# Patient Record
Sex: Female | Born: 1960
Health system: Southern US, Community
[De-identification: ages and names within clinical notes are randomized; demographics above are authoritative.]

## PROBLEM LIST (undated history)

## (undated) DIAGNOSIS — M1990 Unspecified osteoarthritis, unspecified site: Secondary | ICD-10-CM

## (undated) DIAGNOSIS — I10 Essential (primary) hypertension: Secondary | ICD-10-CM

## (undated) DIAGNOSIS — K219 Gastro-esophageal reflux disease without esophagitis: Secondary | ICD-10-CM

## (undated) DIAGNOSIS — K5790 Diverticulosis of intestine, part unspecified, without perforation or abscess without bleeding: Secondary | ICD-10-CM

## (undated) DIAGNOSIS — T7840XA Allergy, unspecified, initial encounter: Secondary | ICD-10-CM

## (undated) DIAGNOSIS — K449 Diaphragmatic hernia without obstruction or gangrene: Secondary | ICD-10-CM

## (undated) DIAGNOSIS — Z86010 Personal history of colonic polyps: Secondary | ICD-10-CM

## (undated) HISTORY — DX: Unspecified osteoarthritis, unspecified site: M19.90

## (undated) HISTORY — PX: TONSILLECTOMY: SUR1361

## (undated) HISTORY — DX: Diverticulosis of intestine, part unspecified, without perforation or abscess without bleeding: K57.90

## (undated) HISTORY — DX: Diaphragmatic hernia without obstruction or gangrene: K44.9

## (undated) HISTORY — DX: Personal history of colonic polyps: Z86.010

## (undated) HISTORY — DX: Allergy, unspecified, initial encounter: T78.40XA

## (undated) HISTORY — DX: Essential (primary) hypertension: I10

---

## 1998-08-01 ENCOUNTER — Emergency Department (HOSPITAL_COMMUNITY): Admission: EM | Admit: 1998-08-01 | Discharge: 1998-08-01 | Payer: Self-pay | Admitting: Emergency Medicine

## 1998-08-01 ENCOUNTER — Encounter: Payer: Self-pay | Admitting: Emergency Medicine

## 1999-01-18 ENCOUNTER — Emergency Department (HOSPITAL_COMMUNITY): Admission: EM | Admit: 1999-01-18 | Discharge: 1999-01-18 | Payer: Self-pay | Admitting: Emergency Medicine

## 1999-04-03 ENCOUNTER — Emergency Department (HOSPITAL_COMMUNITY): Admission: EM | Admit: 1999-04-03 | Discharge: 1999-04-03 | Payer: Self-pay | Admitting: Emergency Medicine

## 1999-06-07 ENCOUNTER — Encounter: Payer: Self-pay | Admitting: Emergency Medicine

## 1999-06-07 ENCOUNTER — Emergency Department (HOSPITAL_COMMUNITY): Admission: EM | Admit: 1999-06-07 | Discharge: 1999-06-07 | Payer: Self-pay | Admitting: Emergency Medicine

## 1999-08-03 ENCOUNTER — Encounter: Admission: RE | Admit: 1999-08-03 | Discharge: 1999-08-03 | Payer: Self-pay | Admitting: Sports Medicine

## 1999-08-20 ENCOUNTER — Other Ambulatory Visit: Admission: RE | Admit: 1999-08-20 | Discharge: 1999-08-20 | Payer: Self-pay | Admitting: Obstetrics and Gynecology

## 1999-09-14 ENCOUNTER — Encounter: Admission: RE | Admit: 1999-09-14 | Discharge: 1999-09-14 | Payer: Self-pay | Admitting: Sports Medicine

## 1999-10-13 ENCOUNTER — Encounter: Admission: RE | Admit: 1999-10-13 | Discharge: 1999-10-13 | Payer: Self-pay | Admitting: Family Medicine

## 1999-11-03 ENCOUNTER — Encounter: Admission: RE | Admit: 1999-11-03 | Discharge: 1999-11-03 | Payer: Self-pay | Admitting: Family Medicine

## 1999-11-19 ENCOUNTER — Emergency Department (HOSPITAL_COMMUNITY): Admission: EM | Admit: 1999-11-19 | Discharge: 1999-11-19 | Payer: Self-pay | Admitting: Emergency Medicine

## 2001-11-06 ENCOUNTER — Encounter: Admission: RE | Admit: 2001-11-06 | Discharge: 2001-11-06 | Payer: Self-pay | Admitting: Family Medicine

## 2002-02-13 ENCOUNTER — Emergency Department (HOSPITAL_COMMUNITY): Admission: EM | Admit: 2002-02-13 | Discharge: 2002-02-13 | Payer: Self-pay | Admitting: Emergency Medicine

## 2005-05-18 ENCOUNTER — Inpatient Hospital Stay (HOSPITAL_COMMUNITY): Admission: AD | Admit: 2005-05-18 | Discharge: 2005-05-18 | Payer: Self-pay | Admitting: Obstetrics and Gynecology

## 2005-08-01 ENCOUNTER — Emergency Department (HOSPITAL_COMMUNITY): Admission: EM | Admit: 2005-08-01 | Discharge: 2005-08-01 | Payer: Self-pay | Admitting: *Deleted

## 2006-10-24 HISTORY — PX: TOTAL ABDOMINAL HYSTERECTOMY W/ BILATERAL SALPINGOOPHORECTOMY: SHX83

## 2006-12-07 ENCOUNTER — Ambulatory Visit: Payer: Self-pay | Admitting: Obstetrics & Gynecology

## 2006-12-07 ENCOUNTER — Encounter: Payer: Self-pay | Admitting: Obstetrics & Gynecology

## 2006-12-07 ENCOUNTER — Encounter (INDEPENDENT_AMBULATORY_CARE_PROVIDER_SITE_OTHER): Payer: Self-pay | Admitting: *Deleted

## 2006-12-12 ENCOUNTER — Ambulatory Visit (HOSPITAL_COMMUNITY): Admission: RE | Admit: 2006-12-12 | Discharge: 2006-12-12 | Payer: Self-pay | Admitting: Obstetrics and Gynecology

## 2006-12-21 ENCOUNTER — Ambulatory Visit: Payer: Self-pay | Admitting: Obstetrics & Gynecology

## 2006-12-27 ENCOUNTER — Encounter: Admission: RE | Admit: 2006-12-27 | Discharge: 2006-12-27 | Payer: Self-pay | Admitting: Family Medicine

## 2007-01-17 ENCOUNTER — Ambulatory Visit: Payer: Self-pay | Admitting: Obstetrics & Gynecology

## 2007-01-29 ENCOUNTER — Ambulatory Visit (HOSPITAL_COMMUNITY): Admission: RE | Admit: 2007-01-29 | Discharge: 2007-01-29 | Payer: Self-pay | Admitting: Obstetrics and Gynecology

## 2007-02-06 ENCOUNTER — Encounter (INDEPENDENT_AMBULATORY_CARE_PROVIDER_SITE_OTHER): Payer: Self-pay | Admitting: *Deleted

## 2007-02-06 ENCOUNTER — Ambulatory Visit: Payer: Self-pay | Admitting: Obstetrics & Gynecology

## 2007-02-06 ENCOUNTER — Inpatient Hospital Stay (HOSPITAL_COMMUNITY): Admission: RE | Admit: 2007-02-06 | Discharge: 2007-02-07 | Payer: Self-pay | Admitting: Obstetrics & Gynecology

## 2007-02-09 ENCOUNTER — Inpatient Hospital Stay (HOSPITAL_COMMUNITY): Admission: AD | Admit: 2007-02-09 | Discharge: 2007-02-09 | Payer: Self-pay | Admitting: Obstetrics & Gynecology

## 2007-02-09 ENCOUNTER — Ambulatory Visit: Payer: Self-pay | Admitting: Obstetrics & Gynecology

## 2007-02-15 ENCOUNTER — Ambulatory Visit: Payer: Self-pay | Admitting: Obstetrics & Gynecology

## 2007-02-16 ENCOUNTER — Inpatient Hospital Stay (HOSPITAL_COMMUNITY): Admission: AD | Admit: 2007-02-16 | Discharge: 2007-02-16 | Payer: Self-pay | Admitting: Family Medicine

## 2007-02-19 ENCOUNTER — Ambulatory Visit: Payer: Self-pay | Admitting: Obstetrics & Gynecology

## 2007-03-21 ENCOUNTER — Ambulatory Visit: Payer: Self-pay | Admitting: Obstetrics & Gynecology

## 2007-05-24 ENCOUNTER — Emergency Department (HOSPITAL_COMMUNITY): Admission: EM | Admit: 2007-05-24 | Discharge: 2007-05-24 | Payer: Self-pay | Admitting: Emergency Medicine

## 2007-07-05 ENCOUNTER — Ambulatory Visit: Payer: Self-pay | Admitting: Obstetrics and Gynecology

## 2007-08-15 ENCOUNTER — Emergency Department (HOSPITAL_COMMUNITY): Admission: EM | Admit: 2007-08-15 | Discharge: 2007-08-15 | Payer: Self-pay | Admitting: Emergency Medicine

## 2007-08-30 ENCOUNTER — Ambulatory Visit: Payer: Self-pay | Admitting: Family Medicine

## 2007-08-30 LAB — CONVERTED CEMR LAB
Basophils Absolute: 0.1 10*3/uL (ref 0.0–0.1)
Basophils Relative: 1 % (ref 0–1)
Chloride: 106 meq/L (ref 96–112)
Creatinine, Ser: 0.76 mg/dL (ref 0.40–1.20)
Eosinophils Absolute: 0.3 10*3/uL (ref 0.0–0.7)
Hemoglobin: 14.7 g/dL (ref 12.0–15.0)
Lymphs Abs: 2.5 10*3/uL (ref 0.7–3.3)
MCHC: 32.9 g/dL (ref 30.0–36.0)
Neutro Abs: 6.8 10*3/uL (ref 1.7–7.7)
Neutrophils Relative %: 67 % (ref 43–77)
Platelets: 263 10*3/uL (ref 150–400)
RBC: 5.03 M/uL (ref 3.87–5.11)
WBC: 10.1 10*3/uL (ref 4.0–10.5)

## 2007-10-15 ENCOUNTER — Ambulatory Visit: Payer: Self-pay | Admitting: *Deleted

## 2007-10-15 ENCOUNTER — Ambulatory Visit: Payer: Self-pay | Admitting: Family Medicine

## 2007-10-15 LAB — CONVERTED CEMR LAB
AST: 20 units/L (ref 0–37)
BUN: 11 mg/dL (ref 6–23)
CO2: 24 meq/L (ref 19–32)
Chloride: 106 meq/L (ref 96–112)
Creatinine, Ser: 0.77 mg/dL (ref 0.40–1.20)
Glucose, Bld: 98 mg/dL (ref 70–99)
LDL Cholesterol: 233 mg/dL — ABNORMAL HIGH (ref 0–99)
Potassium: 4.3 meq/L (ref 3.5–5.3)
Sodium: 140 meq/L (ref 135–145)
Total Protein: 7.2 g/dL (ref 6.0–8.3)
VLDL: 58 mg/dL — ABNORMAL HIGH (ref 0–40)

## 2007-11-01 ENCOUNTER — Emergency Department (HOSPITAL_COMMUNITY): Admission: EM | Admit: 2007-11-01 | Discharge: 2007-11-01 | Payer: Self-pay | Admitting: Emergency Medicine

## 2007-12-13 ENCOUNTER — Ambulatory Visit (HOSPITAL_COMMUNITY): Admission: RE | Admit: 2007-12-13 | Discharge: 2007-12-13 | Payer: Self-pay | Admitting: *Deleted

## 2007-12-21 ENCOUNTER — Ambulatory Visit: Payer: Self-pay | Admitting: Family Medicine

## 2008-01-31 ENCOUNTER — Emergency Department (HOSPITAL_COMMUNITY): Admission: EM | Admit: 2008-01-31 | Discharge: 2008-01-31 | Payer: Self-pay | Admitting: Emergency Medicine

## 2008-04-14 ENCOUNTER — Emergency Department (HOSPITAL_COMMUNITY): Admission: EM | Admit: 2008-04-14 | Discharge: 2008-04-14 | Payer: Self-pay | Admitting: Family Medicine

## 2008-04-15 ENCOUNTER — Emergency Department (HOSPITAL_COMMUNITY): Admission: EM | Admit: 2008-04-15 | Discharge: 2008-04-15 | Payer: Self-pay | Admitting: Emergency Medicine

## 2008-08-12 ENCOUNTER — Emergency Department (HOSPITAL_COMMUNITY): Admission: EM | Admit: 2008-08-12 | Discharge: 2008-08-12 | Payer: Self-pay | Admitting: Emergency Medicine

## 2009-01-11 IMAGING — US US TRANSVAGINAL NON-OB
1 series · 13 of 25 positions shown · non-contrast
Comparison: none

CLINICAL DATA: Symptomatic fibroids. 
TRANSABDOMINAL AND TRANSVAGINAL PELVIC ULTRASOUND:
TECHNIQUE: Both transabdominal and transvaginal ultrasound examinations of the pelvis were performed including evaluation of the uterus, ovaries, adnexal regions, and pelvic cul-de-sac.

[Series 1: us transvaginal non-ob · 0.33mm/px · 13 of 66 slices shown]
[im 1/66]
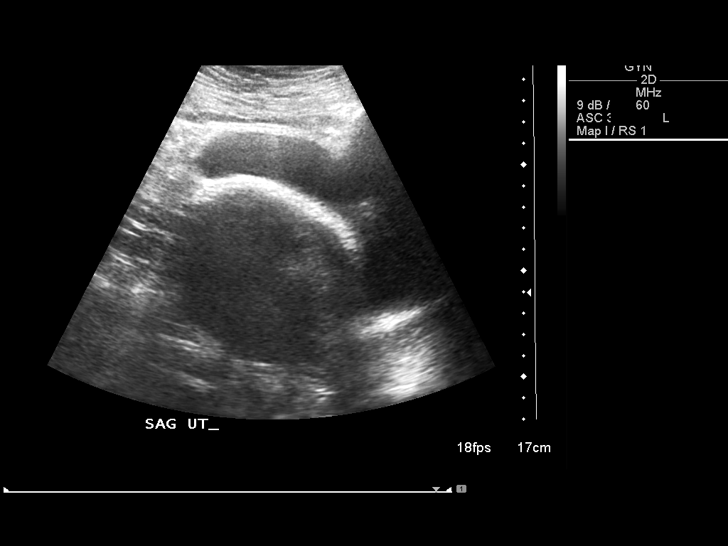
[im 6/66]
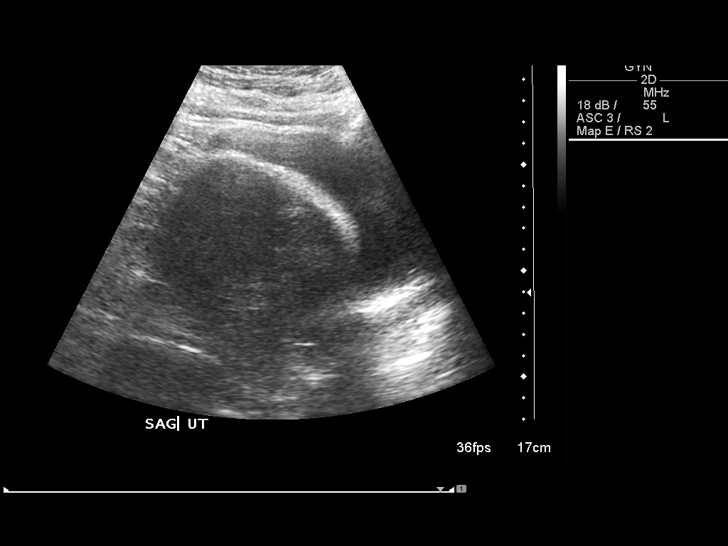
[im 11/66]
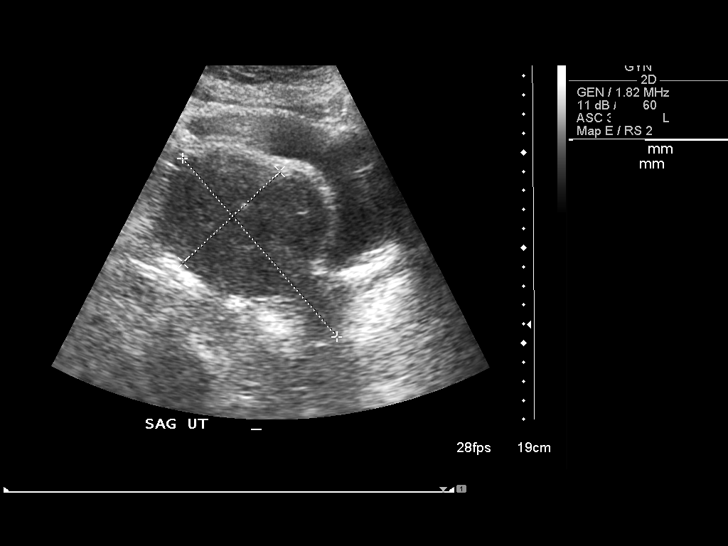
[im 17/66]
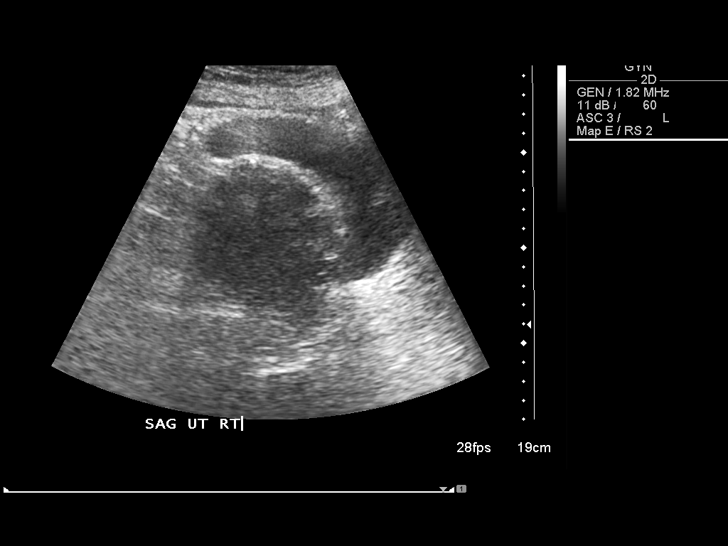
[im 22/66]
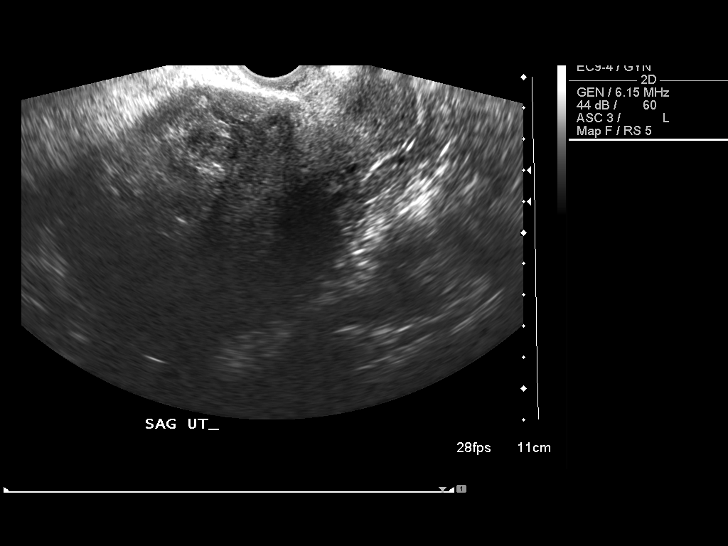
[im 28/66]
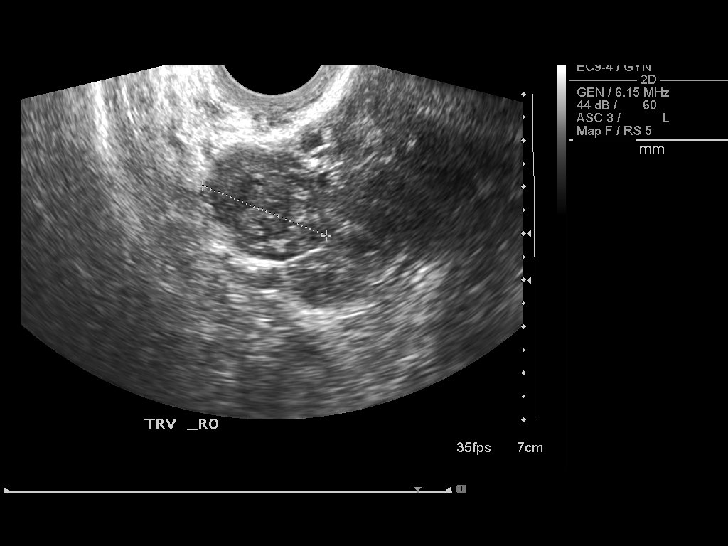
[im 33/66]
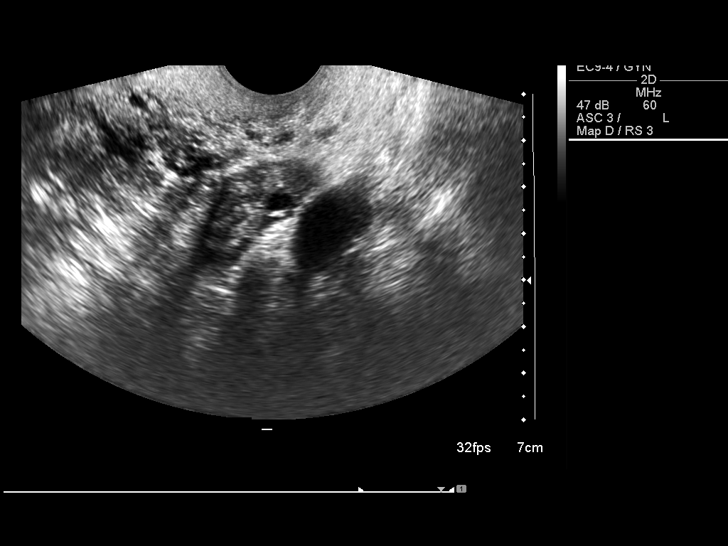
[im 38/66]
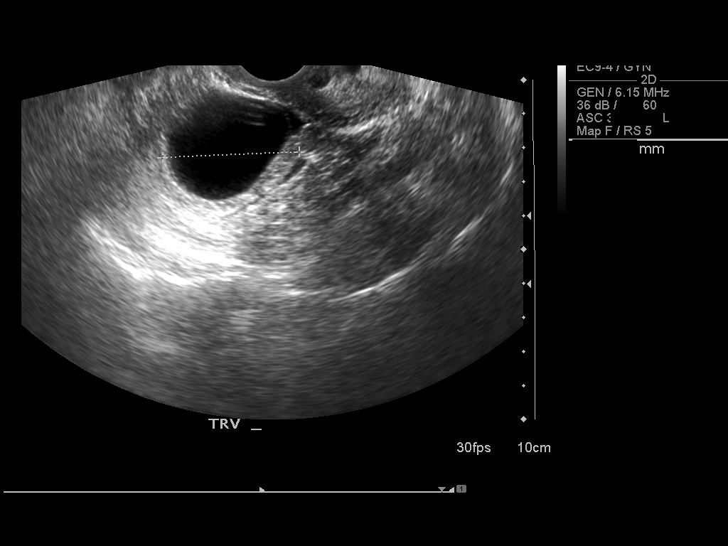
[im 44/66]
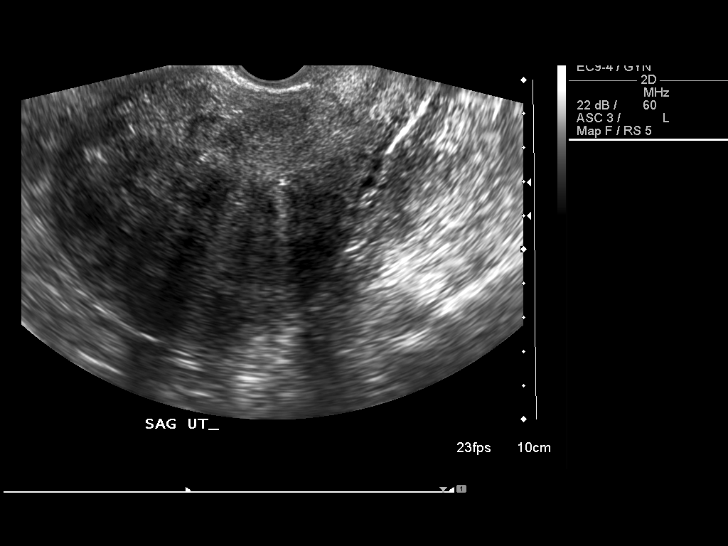
[im 49/66]
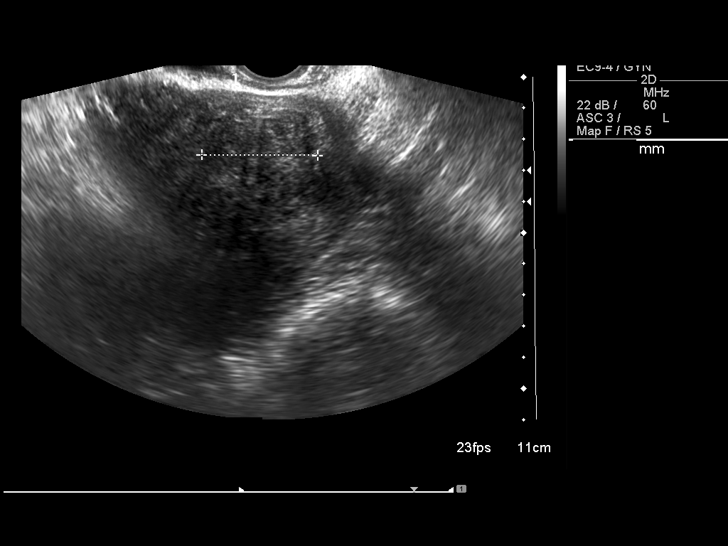
[im 55/66]
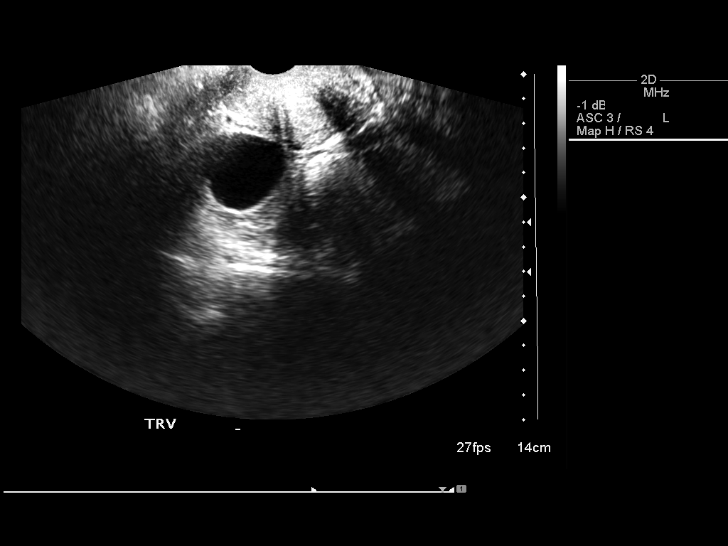
[im 60/66]
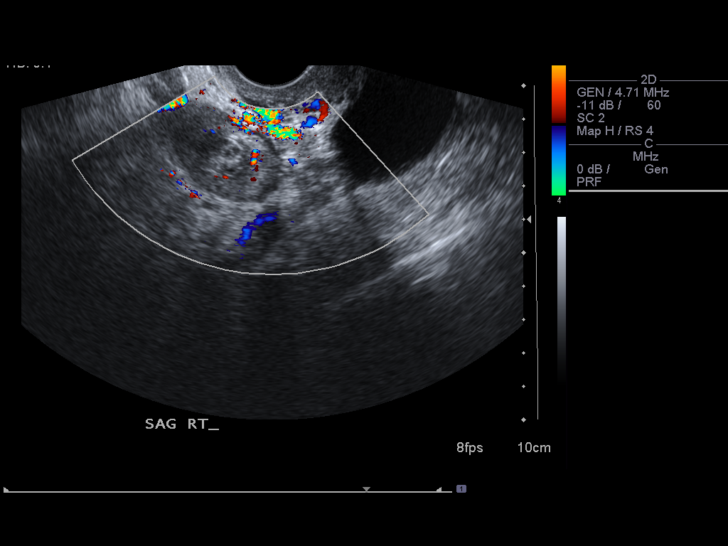
[im 66/66]
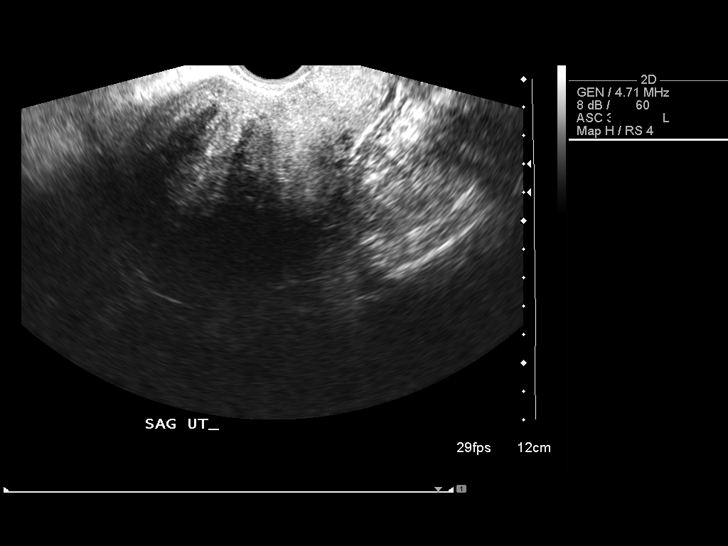

[13 of 25 positions shown; findings below may reference images not displayed]

FINDINGS: The uterus is enlarged with a sagittal length of 12.4 cm, and AP width of 7.6 cm, and a transverse width of 7.4 cm.  The myometrium is diffusely inhomogeneous compatible with fibroid involvement. Only one fibroid could accurately be measured transabdominally and endo-vaginally located in the anterior lower uterine segment measuring 3.7 x 4.5 x 4.0 cm.  Imaging parameters were suboptimal both transabdominally and endo-vaginally in this patient, but diffuse inhomogeneity suggests diffuse fibroid involvement. The endometrial lining could not be clearly enough seen either transabdominally or endo-vaginally to allow for accurate evaluation. 
Both ovaries are seen and have a normal appearance with the right ovary measuring 2.9 x 1.8 x 2.7 cm and the left ovary measuring 2.1 x 3.0 x 1.3 cm.  
In the cul-de-sac there is a thin-walled simple cystic structure noted measuring 4.3 x 2.7 x 3.6 cm. The thin-walled simple nature suggests that this is benign in etiology and likely represents some loculated fluid in this location. The possibility also exists that this represents dilated ampullary portion of one of the fallopian tubes, although it is not close to either ovary making this a less likely consideration.
IMPRESSION: 1.  Fibroid uterus with poor delineation of individual fibroids. Nonvisualized endometrial stripe. 
2.  Normal ovaries. 
3.  Thin-walled unilocular simple cystic structure in the cul-de-sac felt most compatible with an inclusion cyst or loculated fluid.

## 2010-10-10 ENCOUNTER — Emergency Department (HOSPITAL_COMMUNITY)
Admission: EM | Admit: 2010-10-10 | Discharge: 2010-10-10 | Payer: Self-pay | Source: Home / Self Care | Admitting: Emergency Medicine

## 2010-11-14 ENCOUNTER — Encounter: Payer: Self-pay | Admitting: *Deleted

## 2011-03-08 NOTE — Group Therapy Note (Signed)
[name], [name] NO.:  [id number]   MEDICAL RECORD NO.:  [id number]          PATIENT TYPE:  WOC   LOCATION:  [facility]                   FACILITY:  [facility]   PHYSICIAN:  Argentina Donovan, MD        DATE OF BIRTH:  03-Aug-1961   DATE OF SERVICE:                                  CLINIC NOTE   The patient is a 50 year old African American female who underwent total  vaginal hysterectomy/bilateral salpingo-oophorectomy, tension-free  vaginal tape cystoscopy and anterior colporrhaphy on February 06, 2007.  She has been doing very well since surgery except that she is not  tolerating the estrogen patch very well.  She is having rashes from the  patch no matter where she changes it and she still feels jittery as can  be, she says.  She wants the pill.  We have offered her the  alternatives of the pill or the gel.  She prefers the pill. I am going  to start her on Premarin 0.9 a day and then if she is not better in a  month, I think that we may add some Prozac or other SSRI to the regimen.   IMPRESSION:  Post menopausal symptoms and anxiety.           ______________________________  Argentina Donovan, MD     PR/MEDQ  D:  07/05/2007  T:  07/06/2007  Job:  [id number]

## 2011-03-11 NOTE — Op Note (Signed)
NAMEMOLLYE, [name]                ACCOUNT NO.:  [id number]   MEDICAL RECORD NO.:  [id number]          PATIENT TYPE:  INP   LOCATION:  9319                          [facility]   PHYSICIAN:  Allie Bossier, MD        DATE OF BIRTH:  [birth year]   DATE OF PROCEDURE:  02/06/2007  DATE OF DISCHARGE:  02/07/2007                               OPERATIVE REPORT   SURGEON:  Myra C. Marice Potter, M.D.   ASSISTANT:  Blima Rich, M.D.   ANESTHESIA:  Lacretia Leigh, M.D.   COMPLICATIONS:  None.   ESTIMATED BLOOD LOSS:  600 mL.   SPECIMENS:  Morcellated uterus, bilateral tubes and ovaries.   PROCEDURE:  Total vaginal hysteroscopy. Bilateral salpingo-oophorectomy.  Tension-free vaginal tape. Cystoscopy. Anterior colporrhaphy.   DETAILED PROCEDURE AND FINDINGS:  In the office and in the preoperative  area, risks, benefits and alternatives of surgery were explained,  understood and accepted. She understood and wished to proceed. She was  placed in a dorsal lithotomy position, and once she reported herself to  be comfortable, general anesthesia was administered without  complication. Her lower abdomen and vagina were prepped and draped in  usual sterile fashion. A Foley catheter was placed. A bimanual exam  revealed a uterus 10 to 12 weeks' size - globular, not enlarged adnexa,  and a generous pelvis. A weighted speculum was placed posteriorly, a  Deaver anteriorly. The cervix was grasped with a single-toothed  tenaculum, and a total of approximately 60 mL of a solution containing  0.5% Marcaine and 100 mL of injectable saline. Sixty mL of this solution  was injected in a circumferential fashion at the cervicovaginal  junction. Using the Bovie cautery, an incision was made at the  cervicovaginal junction in the anterior and posterior area. The  posterior peritoneum was identified and entered. A longer weighted  speculum was then placed. Of note, she was in Trendelenburg position.  The anterior  peritoneum was identified and entered. The uterosacral  ligaments were clamped, cut and ligated. Please note that 2-0 Vicryl is  used throughout this case unless otherwise specific. The long cervix was  separated from its pelvic attachments using a similar clamp, cut and  ligate technique. Once the uterine vessels were identified, clamped, cut  and ligated, the cervix was then removed. Began coring out large  fibroids to aid in visualization of the remainder of the uterine  attachments to the pelvis. Once I was able to clearly visualize the  broad ligament and utero-ovarian ligaments bilaterally, these  attachments were clamped, cut and ligated. The very large morcellated  uterus was then removed. The ovaries were inspected bilaterally and  noted to be of normal size and shape. There was a very large (3 cm) cyst  of Morgagni on the patient's right oviduct. The utero-ovarian ligaments  bilaterally were identified, clamped, cut and ligated. The bowel was  kept out of the operative site with a moist lap sponge. Once the removal  of the adnexa was completed, all pedicles were inspected and noted to be  hemostatic. Her bladder was again  drained. At the beginning of the case,  a Foley catheter was placed, her bladder was drained, and then the Foley  was clamped. At the completion of the hysterectomy portion of the case,  the bladder was emptied, and clear urine was noted. At this point, the  vaginal mucosa was closed in a vertical fashion with a 2-0 Vicryl  running locking suture. Excellent hemostasis was noted. I then proceeded  to begin the TVT and anterior colporrhaphy portion of the surgery. A  short-weighted speculum was again used in the vault of the vagina, and  the remainder of the injectable solution was injected in the vaginal  mucosa and using a spinal needle behind the symphysis pubis on either  side for placement of the TVT. A vertical incision was made in the  vaginal mucosa from  the cuff to the urethral meatus. Using T clamps and  blunt dissection, the overlying vaginal mucosa was excised off of the  underlying fascia. At this point, I proceeded with the tension-free  vaginal tape placement. The trocar was placed, aiming for each  ipsilateral shoulder. A bladder stylus was placed, and the bladder was  deviated to avoid bladder injury. The trocar was brought behind the  symphysis on the patient's right-hand side, and the trocar was removed.  A similar procedure was performed on the patient's left-hand side. At  this point, I performed cystoscopy. No abnormalities of the bladder were  seen. The plastic sheath was removed from the tension-free vaginal tape,  and the tension-free vaginal tape was elevated to approximately a half a  centimeter beneath the mid urethra. The extra portion of the tape was  cut just below the skin, and Steri-Strips were placed at these sites. I  then proceeded to continue with the anterior colporrhaphy. The cystocele  defect was closed with interrupted 3-0 Vicryl sutures. The excess  vaginal mucosa was excised, and the vaginal mucosa was then  reapproximated using 3-0 Vicryl running locking suture. Excellent  hemostasis was noted. Her Foley was placed to bag. Clear urine was  noted. She was extubated and taken to the recovery room in stable  condition with the instrument, sponge and needle counts correct.      Allie Bossier, MD  Electronically Signed     MCD/MEDQ  D:  02/09/2007  T:  02/09/2007  Job:  [id number]

## 2011-03-11 NOTE — Discharge Summary (Signed)
NAMELURETTA, [name]                ACCOUNT NO.:  [id number]   MEDICAL RECORD NO.:  [id number]          PATIENT TYPE:  INP   LOCATION:  9319                          [facility]   PHYSICIAN:  Allie Bossier, MD        DATE OF BIRTH:  06/30/1961   DATE OF ADMISSION:  02/06/2007  DATE OF DISCHARGE:  02/07/2007                               DISCHARGE SUMMARY   CONDITION:  Stable.   DISPOSITION:  Home.   DIET:  As tolerated.   ACTIVITY:  As tolerated with the exception of nothing per vagina for 6  weeks.   PROCEDURES DURING HOSPITAL STAY:  Total vaginal hysterectomy, bilateral  salpingo-oophorectomy, tension-free vaginal tape, cystoscopy and  anterior colporrhaphy.  Placement of Foley catheter at discharge.   DISCHARGE MEDICATIONS:  1. Zyban 150 mg daily.  2. Climara 0.5 mg patch change weekly.  3. Percocet 5/325 mg 1 p.o. q.4h. p.r.n., #30 and no refills.   DETAILED HOSPITAL COURSE:  The patient has a history of dysfunctional  uterine bleeding, fibroids and stress incontinence.  She was admitted  and underwent the above mentioned procedures in an uncomplicated  fashion.  On postoperative day #1, she was tolerating p.o. without  nausea or vomiting and wishing to be discharged home.  Her preoperative  hemoglobin was 12 and postoperative was 9.  After removal of the  catheter, but prior to discharge, she was only able to void 100 mL and  had approximately 300 mL left.  She was sent home with a catheter and  will follow up on Friday.      Allie Bossier, MD  Electronically Signed     MCD/MEDQ  D:  02/09/2007  T:  02/09/2007  Job:  [id number]

## 2011-03-11 NOTE — H&P (Signed)
[name], [name]                ACCOUNT NO.:  [id number]   MEDICAL RECORD NO.:  [id number]          PATIENT TYPE:  AMB   LOCATION:                                [facility]   PHYSICIAN:  Phil D. Okey Dupre, M.D.     DATE OF BIRTH:  August 19, 1961   DATE OF ADMISSION:  02/06/2007  DATE OF DISCHARGE:                              HISTORY & PHYSICAL   The patient is scheduled for surgery in [facility] with Dr. Marice Potter  and Dr. Mia Creek on 02/06/2007.   CHIEF COMPLAINT:  Extremely heavy painful periods disabling in effect  and losing urine.   HISTORY OF PRESENT ILLNESS:  The patient is a 50 year old gravida 4,  para 3-0-1-3 who over the past 1-1/2 has developed increasingly severe  pelvic pressure and pain with her periods that have become intolerable  and extraordinarily heavy with big clots.  She was told several years  ago she had some fibroids when an ultrasound revealed a uterus measuring  13 centimeters across.  She was seen by Dr. Marice Potter and Dr. Mia Creek and  scheduled for a total vaginal hysterectomy and an anterior colporrhaphy  with TVT.   PAST MEDICAL HISTORY:  The patient is in extremely good health with no  history of surgery.   ALLERGIES:  None.   MEDICATIONS:  None although I have told the patient to start on iron up  until surgery.   FAMILY HISTORY:  Includes a mother with high blood pressure but no other  significant history.   REVIEW OF SYSTEMS:  Negative with the exception of the present illness,  urinary, especially which includes urgency in the morning especially  often losing urine without stress on coughing, sneezing, or sometimes  the slightest amount of jump will cause her to soak her panties.   PHYSICAL EXAMINATION:  VITAL SIGNS:  The patient's blood pressure is  122/79, pulse is 76, temperature is 97.6, respirations 14 per minute.  The weight of the patient is 230 pounds, and she is 5 feet 10 inches  tall.  She is a well-developed, well-nourished  [place] female  in no acute distress.  HEENT:  Pupils equal, round, and reactive to light and accommodation.  Normocephalic within normal limits.  NECK:  Supple.  Thyroid is symmetrical with no masses.  BACK:  Erect.  BREASTS:  Symmetrical with no dominant masses, no nipple discharge.  LUNGS:  Clear to auscultation and percussion.  HEART:  No murmur.  Normal sinus rhythm.  ABDOMEN:  Soft.  Obese. No organomegaly could be palpated, and it is  nontender.  No CVA tenderness.  EXTREMITIES:  No edema.  No varices.  NEUROLOGIC:  Deep tendon reflexes within normal limits.  PELVIC:  External genitalia is normal.  BUS within normal limits with  the exception of a first degree cystocele.  The uterus could not be well  outlined.  The cervix is hypertrophied.  The adnexa could not be  palpated.   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   PLAN:  Total vaginal hysterectomy, possible abdominal hysterectomy with  TVT and anterior colporrhaphy.  Because of the patient's last ultrasound  and the inability to outline the uterus, I have requested a second  ultrasound at this time to make sure that a vaginal hysterectomy is  practical.  In addition, I am getting a CBC today to see whether the  patient needs any further treatment of anemia.           ______________________________  Javier Glazier Okey Dupre, M.D.     PDR/MEDQ  D:  01/17/2007  T:  01/17/2007  Job:  [id number]

## 2011-03-21 ENCOUNTER — Emergency Department (HOSPITAL_COMMUNITY)
Admission: EM | Admit: 2011-03-21 | Discharge: 2011-03-21 | Disposition: A | Discharge status: Home / Self Care | Payer: Self-pay | Attending: Emergency Medicine | Admitting: Emergency Medicine

## 2011-03-21 DIAGNOSIS — K089 Disorder of teeth and supporting structures, unspecified: Secondary | ICD-10-CM | POA: Insufficient documentation

## 2011-03-21 DIAGNOSIS — K047 Periapical abscess without sinus: Secondary | ICD-10-CM | POA: Insufficient documentation

## 2011-10-25 DIAGNOSIS — Z860100 Personal history of colon polyps, unspecified: Secondary | ICD-10-CM

## 2011-10-25 DIAGNOSIS — K5790 Diverticulosis of intestine, part unspecified, without perforation or abscess without bleeding: Secondary | ICD-10-CM

## 2011-10-25 HISTORY — DX: Personal history of colonic polyps: Z86.010

## 2011-10-25 HISTORY — DX: Personal history of colon polyps, unspecified: Z86.0100

## 2011-10-25 HISTORY — DX: Diverticulosis of intestine, part unspecified, without perforation or abscess without bleeding: K57.90

## 2012-02-18 ENCOUNTER — Emergency Department (HOSPITAL_COMMUNITY)
Admission: EM | Admit: 2012-02-18 | Discharge: 2012-02-18 | Disposition: A | Discharge status: Home / Self Care | Payer: Self-pay | Attending: Emergency Medicine | Admitting: Emergency Medicine

## 2012-02-18 ENCOUNTER — Encounter (HOSPITAL_COMMUNITY): Payer: Self-pay | Admitting: General Practice

## 2012-02-18 DIAGNOSIS — M1710 Unilateral primary osteoarthritis, unspecified knee: Secondary | ICD-10-CM | POA: Insufficient documentation

## 2012-02-18 DIAGNOSIS — M1990 Unspecified osteoarthritis, unspecified site: Secondary | ICD-10-CM

## 2012-02-18 MED ORDER — HYDROCODONE-ACETAMINOPHEN 5-325 MG PO TABS: 1.0000 | ORAL_TABLET | ORAL | Status: AC | PRN

## 2012-02-18 MED ORDER — MELOXICAM 7.5 MG PO TABS: 7.5000 mg | ORAL_TABLET | Freq: Every day | ORAL | Status: DC

## 2012-02-18 NOTE — ED Provider Notes (Signed)
History     CSN: [id number]  Arrival date & time 02/18/12  1910   First MD Initiated Contact with Patient 02/18/12 2043      No chief complaint on file.   (Consider location/radiation/quality/duration/timing/severity/associated sxs/prior treatment) HPI History provided by pt.   Pt has been diagnosed w/ knee arthritis in the past.  For the past three weeks, she has had gradually worsening pain and edema of both knees, but especially the left.  Pain most prominent anteriorly, but when she ambulates, she develops a "pulling" pain from proximal left calf to distal posterior thigh.  Sx worst at night.  Has been taking ibuprofen w/out relief.  No associated fever or paresthesias.  Denies trauma but patient on her feet all day at work on a [employer]. No RF for DVT.    No past medical history on file.  Past Surgical History  Procedure Date  . Tonsillectomy   . Vaginal hysterectomy     No family history on file.  History  Substance Use Topics  . Smoking status: Not on file  . Smokeless tobacco: Not on file  . Alcohol Use: Not on file    OB History    No data available      Review of Systems  All other systems reviewed and are negative.    Allergies  Review of patient's allergies indicates no known allergies.  Home Medications   Current Outpatient Rx  Name Route Sig Dispense Refill  . ASPIRIN 81 MG PO CHEW Oral Chew 81 mg by mouth daily.    Marland Kitchen CETIRIZINE HCL 10 MG PO TABS Oral Take 10 mg by mouth daily as needed. Allergies.    . IBUPROFEN 200 MG PO TABS Oral Take 800 mg by mouth every 6 (six) hours as needed. Pain.    Marland Kitchen HYDROCODONE-ACETAMINOPHEN 5-325 MG PO TABS Oral Take 1 tablet by mouth every 4 (four) hours as needed for pain. 20 tablet 0  . MELOXICAM 7.5 MG PO TABS Oral Take 1 tablet (7.5 mg total) by mouth daily. 30 tablet 0    There were no vitals taken for this visit.  Physical Exam  Nursing note and vitals reviewed. Constitutional: She is oriented to  person, place, and time. She appears well-developed and well-nourished. No distress.  HENT:  Head: Normocephalic and atraumatic.  Eyes:       Normal appearance  Neck: Normal range of motion.  Pulmonary/Chest: Effort normal.  Musculoskeletal: Normal range of motion.       Edema bilateral anterior knees, left worse than right.  No erythema or warmth.  Mild tenderness medial and lateral to left patella.  Right knee non-tender.  Full active ROM both knees but pain w/ complete extension and extreme flexion of left.  Distal NV intact.  Pt is ambulatory.  Neurological: She is alert and oriented to person, place, and time.  Psychiatric: She has a normal mood and affect. Her behavior is normal.    ED Course  Procedures (including critical care time)  Labs Reviewed - No data to display No results found.   1. Arthritis       MDM  Pt w/ h/o knee arthritis presents w/ gradually worsening, non-traumatic pain/edema bilateral knees, left worse than right.  Pain most prominent anteriorly but pulling sensation posterior left leg when bearing weight.  No RF for DVT and edema is isolated to anterior knee joint on exam.  No signs of joint infection and no NS deficits.  Pt d/c'd home  w/ meloxicam, hydrocodone and referral to ortho.  Recommended ice and elevation.  She declined crutches.  Return precautions discussed.         Otilio Miu, PA 02/19/12 1256  Otilio Miu, [place] 02/19/12 1258

## 2012-02-18 NOTE — ED Notes (Signed)
Pt states that she has been having swelling off and on for the last 3 weeks in her right knee. She states that she has taken ibuprofen for the swelling in pain. Pt is in no acute distress at this time.

## 2012-02-18 NOTE — Discharge Instructions (Signed)
Take meloxicam once a day.  Discontinue the ibuprofen while your on this medication. Arthritis, Nonspecific Arthritis is inflammation of a joint. This usually means pain, redness, warmth or swelling are present. One or more joints may be involved. There are a number of types of arthritis. Your caregiver may not be able to tell what type of arthritis you have right away. CAUSES  The most common cause of arthritis is the wear and tear on the joint (osteoarthritis). This causes damage to the cartilage, which can break down over time. The knees, hips, back and neck are most often affected by this type of arthritis. Other types of arthritis and common causes of joint pain include:  Sprains and other injuries near the joint. Sometimes minor sprains and injuries cause pain and swelling that develop hours later.   Rheumatoid arthritis. This affects hands, feet and knees. It usually affects both sides of your body at the same time. It is often associated with chronic ailments, fever, weight loss and general weakness.   Crystal arthritis. Gout and pseudo gout can cause occasional acute severe pain, redness and swelling in the foot, ankle, or knee.   Infectious arthritis. Bacteria can get into a joint through a break in overlying skin. This can cause infection of the joint. Bacteria and viruses can also spread through the blood and affect your joints.   Drug, infectious and allergy reactions. Sometimes joints can become mildly painful and slightly swollen with these types of illnesses.  SYMPTOMS   Pain is the main symptom.   Your joint or joints can also be red, swollen and warm or hot to the touch.   You may have a fever with certain types of arthritis, or even feel overall ill.   The joint with arthritis will hurt with movement. Stiffness is present with some types of arthritis.  DIAGNOSIS  Your caregiver will suspect arthritis based on your description of your symptoms and on your exam. Testing may  be needed to find the type of arthritis:  Blood and sometimes urine tests.   X-ray tests and sometimes CT or MRI scans.   Removal of fluid from the joint (arthrocentesis) is done to check for bacteria, crystals or other causes. Your caregiver (or a specialist) will numb the area over the joint with a local anesthetic, and use a needle to remove joint fluid for examination. This procedure is only minimally uncomfortable.   Even with these tests, your caregiver may not be able to tell what kind of arthritis you have. Consultation with a specialist (rheumatologist) may be helpful.  TREATMENT  Your caregiver will discuss with you treatment specific to your type of arthritis. If the specific type cannot be determined, then the following general recommendations may apply. Treatment of severe joint pain includes:  Rest.   Elevation.   Anti-inflammatory medication (for example, ibuprofen) may be prescribed. Avoiding activities that cause increased pain.   Only take over-the-counter or prescription medicines for pain and discomfort as recommended by your caregiver.   Cold packs over an inflamed joint may be used for 10 to 15 minutes every hour. Hot packs sometimes feel better, but do not use overnight. Do not use hot packs if you are diabetic without your caregiver's permission.   A cortisone shot into arthritic joints may help reduce pain and swelling.   Any acute arthritis that gets worse over the next 1 to 2 days needs to be looked at to be sure there is no joint infection.  Long-term  arthritis treatment involves modifying activities and lifestyle to reduce joint stress jarring. This can include weight loss. Also, exercise is needed to nourish the joint cartilage and remove waste. This helps keep the muscles around the joint strong. HOME CARE INSTRUCTIONS   Do not take aspirin to relieve pain if gout is suspected. This elevates uric acid levels.   Only take over-the-counter or prescription  medicines for pain, discomfort or fever as directed by your caregiver.   Rest the joint as much as possible.   If your joint is swollen, keep it elevated.   Use crutches if the painful joint is in your leg.   Drinking plenty of fluids may help for certain types of arthritis.   Follow your caregiver's dietary instructions.   Try low-impact exercise such as:   Swimming.   Water aerobics.   Biking.   Walking.   Morning stiffness is often relieved by a warm shower.   Put your joints through regular range-of-motion.  SEEK MEDICAL CARE IF:   You do not feel better in 24 hours or are getting worse.   You have side effects to medications, or are not getting better with treatment.  SEEK IMMEDIATE MEDICAL CARE IF:   You have a fever.   You develop severe joint pain, swelling or redness.   Many joints are involved and become painful and swollen.   There is severe back pain and/or leg weakness.   You have loss of bowel or bladder control.  Document Released: 11/17/2004 Document Revised: 09/29/2011 Document Reviewed: 12/03/2008 [facility] Patient Information 2012 [place], [place].Take vicodin as prescribed for severe pain.   Do not drive within four hours of taking this medication (may cause drowsiness or confusion).    Ice 2-3 times a day for 15-20 minutes, elevate whenever possible and avoid activities that aggravate pain.   Follow up with the orthopedic physician.  You should return to the ER if your pain or swelling worsens/spreads or you develop associated fever.

## 2012-02-18 NOTE — ED Notes (Signed)
Spoke with Santina Evans PA  Pt not actually to be given mobic and norco prior to discharge but these are prescription orders,  Not sure as to why had to be acknowledged,  Pt given prescriptions as per discharge instructions

## 2012-02-25 NOTE — ED Provider Notes (Signed)
Medical screening examination/treatment/procedure(s) were performed by non-physician practitioner and as supervising physician I was immediately available for consultation/collaboration.  Carsen Leaf, MD 02/25/12 0819 

## 2012-03-14 ENCOUNTER — Emergency Department (HOSPITAL_COMMUNITY)
Admission: EM | Admit: 2012-03-14 | Discharge: 2012-03-14 | Disposition: A | Discharge status: Home / Self Care | Payer: Self-pay | Attending: Emergency Medicine | Admitting: Emergency Medicine

## 2012-03-14 ENCOUNTER — Encounter (HOSPITAL_COMMUNITY): Payer: Self-pay | Admitting: Emergency Medicine

## 2012-03-14 DIAGNOSIS — L989 Disorder of the skin and subcutaneous tissue, unspecified: Secondary | ICD-10-CM | POA: Insufficient documentation

## 2012-03-14 DIAGNOSIS — R238 Other skin changes: Secondary | ICD-10-CM

## 2012-03-14 NOTE — ED Notes (Signed)
Pt states she has been cleaning her face with green alcohol and states now the left side of her face is very painful and she states if she touches her left eye it sends pain all the way up into her head  Pt states she has been using aloe on it but it is not helping

## 2012-03-14 NOTE — Discharge Instructions (Signed)
Do not apply any more alcohol to your skin.  Return if you develop a rash or if the pain becomes more severe.  Return if your skin becomes red or warm to the touch.  Follow up with your Primary Care Physician.  RESOURCE GUIDE  Dental Problems  Patients with Medicaid: [facility] [phone] [address]                                           [phone] [address] Phone:  [phone]                                                  Phone:  [phone]  If unable to pay or uninsured, contact:  [facility] or [facility] to become qualified for the adult dental clinic.  Chronic Pain Problems Contact [facility]  [phone] Patients need to be referred by their primary care doctor.  Insufficient Money for Medicine Contact [employer] Way:  call "211" or [facility] [phone].  No Primary Care Doctor Call [facility]  [phone] Other agencies that provide inexpensive medical care    [facility]  [phone]    [facility]  [phone]    [facility]  [phone]    [facility]  [phone]    [facility]  [phone]    [facility]  [phone]  Psychological Services [facility]  [phone] [facility]  [phone] [facility]   [phone] (emergency services ([phone])073-7061)  Substance Abuse Resources [facility]  ([phone] [facility] [phone] [facility] [phone] Floydene Flock [phone] Residential & Outpatient Substance Abuse Program  754-112-3896  Abuse/Neglect [facility] Child Abuse Hotline [phone] [facility] Child Abuse Hotline 219-553-4019 (After Hours)  Emergency Shelter [facility] 445-437-9466  [facility] [phone] [facility] [phone]  MRSA Hotline #:    (574)138-4788    [facility] Resources  [facility]     [employer]                          [facility] Dept. [address] [place]                       [address]      [address]  [place]                                                Cristobal Goldmann Phone:  [phone]  Phone:  [phone]                 Phone:  [phone]  [facility] Mental [facility] Phone:  [phone]  [facility] Child Abuse Hotline (601)[phone] (After Hours)

## 2012-03-14 NOTE — ED Provider Notes (Signed)
History     CSN: [id number]  Arrival date & time 03/14/12  4098   First MD Initiated Contact with Patient 03/14/12 2029      Chief Complaint  Patient presents with  . Facial Burn    (Consider location/radiation/quality/duration/timing/severity/associated sxs/prior treatment) HPI Comments: Patient comes in today with a chief complaint of a burning feeling on the left side of her face for the past 5 days.  She reports that she was using green alcohol to clean her face several times a day for a week prior to this.  She talked with a Pharmacist 4 days ago and he told her that she probably had a chemical burn of her face and told her to start using Aloe Vera.  She has since been using that.  She denies any rash.  Denies any decreased sensation of her face.  She reports that she has noticed mild peeling of her skin.  No fever or chills.  No erythema or facial swelling.    The history is provided by the patient.    History reviewed. No pertinent past medical history.  Past Surgical History  Procedure Date  . Tonsillectomy   . Vaginal hysterectomy     Family History  Problem Relation Age of Onset  . Hypertension Mother   . Hyperlipidemia Mother     History  Substance Use Topics  . Smoking status: Current Everyday Smoker -- 0.5 packs/day for 31 years    Types: Cigarettes  . Smokeless tobacco: Never Used  . Alcohol Use: 1.2 oz/week    2 Glasses of wine per week    OB History    Grav Para Term Preterm Abortions TAB SAB Ect Mult Living                  Review of Systems  Constitutional: Negative for fever and chills.  HENT: Negative for facial swelling.   Eyes: Negative for visual disturbance.  Gastrointestinal: Negative for nausea and vomiting.  Musculoskeletal: Negative for gait problem.  Neurological: Negative for dizziness, syncope, facial asymmetry, speech difficulty, weakness, light-headedness, numbness and headaches.  Psychiatric/Behavioral: Negative for confusion.      Allergies  Review of patient's allergies indicates no known allergies.  Home Medications   Current Outpatient Rx  Name Route Sig Dispense Refill  . ALBUTEROL SULFATE HFA 108 (90 BASE) MCG/ACT IN AERS Inhalation Inhale 2 puffs into the lungs every 6 (six) hours as needed. For shortness of breath    . ASPIRIN 81 MG PO CHEW Oral Chew 81 mg by mouth daily.    Marland Kitchen CETIRIZINE HCL 10 MG PO TABS Oral Take 10 mg by mouth daily as needed. Allergies.    . MELOXICAM 7.5 MG PO TABS Oral Take 1 tablet (7.5 mg total) by mouth daily. 30 tablet 0  . IBUPROFEN 200 MG PO TABS Oral Take 800 mg by mouth every 6 (six) hours as needed. Pain.      BP 129/76  Pulse 105  Temp(Src) 98.5 F (36.9 C) (Oral)  Resp 16  Ht 5\' 10"  (1.778 m)  Wt 210 lb (95.255 kg)  BMI 30.13 kg/m2  SpO2 98%  Physical Exam  Nursing note and vitals reviewed. Constitutional: She appears well-developed and well-nourished. No distress.  HENT:  Head: Normocephalic and atraumatic.  Nose: Nose normal.  Mouth/Throat: Oropharynx is clear and moist.  Eyes: EOM are normal. Pupils are equal, round, and reactive to light.  Neck: Normal range of motion. Neck supple.  Cardiovascular: Normal rate, regular  rhythm and normal heart sounds.   Pulmonary/Chest: Effort normal and breath sounds normal.  Neurological: She is alert. No cranial nerve deficit or sensory deficit. Gait normal.  Skin: Skin is warm, dry and intact. No abrasion, no burn and no rash noted. She is not diaphoretic. No erythema.       Mild dryness of the skin  Psychiatric: She has a normal mood and affect.    ED Course  Procedures (including critical care time)  Labs Reviewed - No data to display No results found.   No diagnosis found.    MDM  Patient with burning sensation on the left side of her face for the past 5 days.  No fever or chills.  No sensory deficit.  CN II-XII grossly intact.  No rash visualized.  She has been using alcohol on her skin, which may  have caused irritation.  Patient educated on Shingles and instructed to return if rash develops.        Pascal Lux [place], PA-C 03/16/12 [phone]

## 2012-03-14 NOTE — ED Notes (Signed)
Pt reports using rubbing alcohol a week ago to clean face. Pt reports pain started last Thursday and has become worse, pain is located on left side of face and periorbital. Pt went to see PCP and was told to use Aftersun Aloe lotion which she has used without relief.  Pt denies changes in vision.  Pt reports pain is worse with palpation. No lesions noted to face.

## 2012-03-16 NOTE — ED Provider Notes (Signed)
Medical screening examination/treatment/procedure(s) were performed by non-physician practitioner and as supervising physician I was immediately available for consultation/collaboration.  Flint Melter, MD 03/16/12 [phone]

## 2012-03-18 ENCOUNTER — Encounter (HOSPITAL_COMMUNITY): Payer: Self-pay | Admitting: Emergency Medicine

## 2012-03-18 ENCOUNTER — Emergency Department (HOSPITAL_COMMUNITY)
Admission: EM | Admit: 2012-03-18 | Discharge: 2012-03-18 | Disposition: A | Discharge status: Home / Self Care | Payer: Self-pay | Attending: Emergency Medicine | Admitting: Emergency Medicine

## 2012-03-18 DIAGNOSIS — F17200 Nicotine dependence, unspecified, uncomplicated: Secondary | ICD-10-CM | POA: Insufficient documentation

## 2012-03-18 DIAGNOSIS — Z7982 Long term (current) use of aspirin: Secondary | ICD-10-CM | POA: Insufficient documentation

## 2012-03-18 DIAGNOSIS — Z79899 Other long term (current) drug therapy: Secondary | ICD-10-CM | POA: Insufficient documentation

## 2012-03-18 DIAGNOSIS — G501 Atypical facial pain: Secondary | ICD-10-CM | POA: Insufficient documentation

## 2012-03-18 MED ORDER — VALACYCLOVIR HCL 500 MG PO TABS: 1000.0000 mg | ORAL_TABLET | Freq: Three times a day (TID) | ORAL | Status: DC

## 2012-03-18 MED ORDER — TETRACAINE HCL 0.5 % OP SOLN
2.0000 [drp] | Freq: Once | OPHTHALMIC | Status: AC
  Administered 2012-03-18: 2 [drp] via OPHTHALMIC
  Filled 2012-03-18 (×2): qty 2

## 2012-03-18 MED ORDER — FLUORESCEIN SODIUM 1 MG OP STRP
ORAL_STRIP | OPHTHALMIC | Status: AC
  Administered 2012-03-18: 1
  Filled 2012-03-18: qty 1

## 2012-03-18 MED ORDER — SODIUM CHLORIDE 0.9 % IV BOLUS (SEPSIS): 1000.0000 mL | Freq: Once | INTRAVENOUS | Status: DC

## 2012-03-18 NOTE — ED Notes (Signed)
Per pt, she presented to [facility] a few days ago.  Pt states that staff there speculated she could have shingles, but did not diagnose or provide treatment for it.  Pt was told to observe for a rash which she denies she has.  Pt continues to have burning sensation on left side of face.

## 2012-03-18 NOTE — ED Provider Notes (Signed)
History     CSN: [id number]  Arrival date & time 03/18/12  1719   First MD Initiated Contact with Patient 03/18/12 1803      Chief Complaint  Patient presents with  . Eye Pain    (Consider location/radiation/quality/duration/timing/severity/associated sxs/prior treatment) HPI Comments: 1 week of burning feeling to her left midface and pain in left eye.  Pt thinks she may have sunburned her face.  Has been using aloe vera but no improvement.  Seen at [facility] 2 days ago but has had no improvement since then.  No difficulty with vision and otherwise doing well.  Patient is a 51 y.o. female presenting with eye pain. The history is provided by the patient.  Eye Pain This is a new problem. Episode onset: 1 week. The problem occurs constantly. The problem has been unchanged. Pertinent negatives include no abdominal pain, chest pain, congestion, fever or rash. Exacerbated by: touching her face. Treatments tried: aloe vera. The treatment provided no relief.    History reviewed. No pertinent past medical history.  Past Surgical History  Procedure Date  . Tonsillectomy   . Vaginal hysterectomy     Family History  Problem Relation Age of Onset  . Hypertension Mother   . Hyperlipidemia Mother     History  Substance Use Topics  . Smoking status: Current Everyday Smoker -- 0.5 packs/day for 31 years    Types: Cigarettes  . Smokeless tobacco: Never Used  . Alcohol Use: 1.2 oz/week    2 Glasses of wine per week    OB History    Grav Para Term Preterm Abortions TAB SAB Ect Mult Living                  Review of Systems  Constitutional: Negative for fever and activity change.  HENT: Negative for congestion.   Eyes: Positive for pain. Negative for visual disturbance.  Respiratory: Negative for chest tightness and shortness of breath.   Cardiovascular: Negative for chest pain and leg swelling.  Gastrointestinal: Negative for abdominal pain.  Genitourinary: Negative for  dysuria.  Skin: Negative for rash.  Neurological: Negative for syncope.  Psychiatric/Behavioral: Negative for behavioral problems.    Allergies  Review of patient's allergies indicates no known allergies.  Home Medications   Current Outpatient Rx  Name Route Sig Dispense Refill  . ALBUTEROL SULFATE HFA 108 (90 BASE) MCG/ACT IN AERS Inhalation Inhale 2 puffs into the lungs every 6 (six) hours as needed. For shortness of breath    . ASPIRIN 81 MG PO CHEW Oral Chew 81 mg by mouth daily.    Marland Kitchen CETIRIZINE HCL 10 MG PO TABS Oral Take 10 mg by mouth daily as needed. Allergies.    . IBUPROFEN 200 MG PO TABS Oral Take 800 mg by mouth every 6 (six) hours as needed. Pain.    . MELOXICAM 7.5 MG PO TABS Oral Take 1 tablet (7.5 mg total) by mouth daily. 30 tablet 0    BP 143/80  Pulse 94  Temp(Src) 98.3 F (36.8 C) (Oral)  Resp 20  SpO2 99%  Physical Exam  Constitutional: She is oriented to person, place, and time. She appears well-developed and well-nourished.  HENT:  Head: Normocephalic and atraumatic.  Eyes: Conjunctivae and EOM are normal. Pupils are equal, round, and reactive to light. No scleral icterus.       No injection.  20/40 vision in r eye.  20/30 in left  With glasses.  IOP 14 on L side.  All visual fields intact.  Neck: Normal range of motion. Neck supple.  Pulmonary/Chest: Effort normal.  Musculoskeletal: Normal range of motion.  Neurological: She is alert and oriented to person, place, and time. She has normal reflexes. No cranial nerve deficit.       Sensation intact on both side of face.  No pain with palpation.  Skin: Skin is warm and dry. No rash noted.       No rash or erythema to face.  Psychiatric: She has a normal mood and affect. Her behavior is normal. Judgment and thought content normal.    ED Course  Procedures (including critical care time)  Labs Reviewed - No data to display No results found.   1. Atypical face pain       MDM  1 week of burning  feeling to her left midface and pain in left eye.  Pt thinks she may have sunburned her face.  Has been using aloe vera but no improvement.  Seen at [facility] 2 days ago but has had no improvement since then.  No difficulty with vision and otherwise doing well.  Area of burning is left face from side of mouth to eye and includes eye.  VSS and well appearing.  Sensation intact on face.  Visual acuity intact.  No abrasion or dendritic lesions on fluorocein exam.  IOP normal.  Etiology of pain unclear.  Possible early zoster or atypical trigeminal neuralgia given intermittent episodes of pain.  No evidence of rash or burn at this time.  Gave valacyclovir script for pt to start if rash occurs.  Otherwise will f/u with neuro for possible trigeminal neuralgia.  Pt comfortable with plan and will follow up.         Army Chaco, MD 03/18/12 [phone]

## 2012-03-18 NOTE — ED Notes (Addendum)
Intermittent episodes of L eye redness and burning to L side of face x 1 week. Denies any problems with vision.

## 2012-03-18 NOTE — Discharge Instructions (Signed)
Trigeminal Neuralgia Trigeminal neuralgia is a nerve disorder that causes sudden attacks of severe facial pain. It is caused by damage to the trigeminal nerve, a major nerve in the face. It is more common in women and in the elderly, although it can also happen in younger patients. Attacks last from a few seconds to several minutes and can occur from a couple of times per year to several times per day. Trigeminal neuralgia can be a very distressing and disabling condition. Surgery may be needed in very severe cases if medical treatment does not give relief. HOME CARE INSTRUCTIONS   If your caregiver prescribed medication to help prevent attacks, take as directed.   To help prevent attacks:   Chew on the unaffected side of the mouth.   Avoid touching your face.   Avoid blasts of hot or cold air.   Men may wish to grow a beard to avoid having to shave.  SEEK IMMEDIATE MEDICAL CARE IF:  Pain is unbearable and your medicine does not help.   You develop new, unexplained symptoms (problems).   You have problems that may be related to a medication you are taking.  Document Released: 10/07/2000 Document Revised: 09/29/2011 Document Reviewed: 08/07/2009 [facility] Patient Information 2012 [place], [place].Shingles Shingles is caused by the same virus that causes chickenpox (varicella zoster virus or VZV). Shingles often occurs many years or decades after having chickenpox. That is why it is more common in adults older than 50 years. The virus reactivates and breaks out as an infection in a nerve root. SYMPTOMS   The initial feeling (sensations) may be pain. This pain is usually described as:   Burning.   Stabbing.   Throbbing.   Tingling in the nerve root.   A red rash will follow in a couple days. The rash may occur in any area of the body and is usually on one side (unilateral) of the body in a band or belt-like pattern. The rash usually starts out as very small blisters (vesicles). They  will dry up after 7 to 10 days. This is not usually a significant problem except for the pain it causes.   Long-lasting (chronic) pain is more likely in an elderly person. It can last months to years. This condition is called postherpetic neuralgia.  Shingles can be an extremely severe infection in someone with AIDS, a weakened immune system, or with forms of leukemia. It can also be severe if you are taking transplant medicines or other medicines that weaken the immune system. TREATMENT  Your caregiver will often treat you with:  Antiviral drugs.   Anti-inflammatory drugs.   Pain medicines.  Bed rest is very important in preventing the pain associated with herpes zoster (postherpetic neuralgia). Application of heat in the form of a hot water bottle or electric heating pad or gentle pressure with the hand is recommended to help with the pain or discomfort. PREVENTION  A varicella zoster vaccine is available to help protect against the virus. The Food and Drug Administration approved the varicella zoster vaccine for individuals 29 years of age and older. HOME CARE INSTRUCTIONS   Cool compresses to the area of rash may be helpful.   Only take over-the-counter or prescription medicines for pain, discomfort, or fever as directed by your caregiver.   Avoid contact with:   Babies.   Pregnant women.   Children with eczema.   Elderly people with transplants.   People with chronic illnesses, such as leukemia and AIDS.   If  the area involved is on your face, you may receive a referral for follow-up to a specialist. It is very important to keep all follow-up appointments. This will help avoid eye complications, chronic pain, or disability.  SEEK IMMEDIATE MEDICAL CARE IF:   You develop any pain (headache) in the area of the face or eye. This must be followed carefully by your caregiver or ophthalmologist. An infection in part of your eye (cornea) can be very serious. It could lead to  blindness.   You do not have pain relief from prescribed medicines.   Your redness or swelling spreads.   The area involved becomes very swollen and painful.   You have a fever.   You notice any red or painful lines extending away from the affected area toward your heart (lymphangitis).   Your condition is worsening or has changed.  Document Released: 10/10/2005 Document Revised: 09/29/2011 Document Reviewed: 09/14/2009 [facility] Patient Information 2012 [place], [place].

## 2012-03-19 NOTE — ED Provider Notes (Signed)
I reviewed the resident's note and I agree with the findings and plan.     Nelia Shi, MD 03/19/12 [phone]

## 2012-05-23 ENCOUNTER — Emergency Department (HOSPITAL_COMMUNITY)
Admission: EM | Admit: 2012-05-23 | Discharge: 2012-05-23 | Disposition: A | Discharge status: Home / Self Care | Payer: Self-pay | Attending: Emergency Medicine | Admitting: Emergency Medicine

## 2012-05-23 ENCOUNTER — Encounter (HOSPITAL_COMMUNITY): Payer: Self-pay | Admitting: Emergency Medicine

## 2012-05-23 DIAGNOSIS — K219 Gastro-esophageal reflux disease without esophagitis: Secondary | ICD-10-CM | POA: Insufficient documentation

## 2012-05-23 DIAGNOSIS — K30 Functional dyspepsia: Secondary | ICD-10-CM

## 2012-05-23 DIAGNOSIS — R0789 Other chest pain: Secondary | ICD-10-CM | POA: Insufficient documentation

## 2012-05-23 DIAGNOSIS — F17200 Nicotine dependence, unspecified, uncomplicated: Secondary | ICD-10-CM | POA: Insufficient documentation

## 2012-05-23 DIAGNOSIS — R066 Hiccough: Secondary | ICD-10-CM | POA: Insufficient documentation

## 2012-05-23 HISTORY — DX: Gastro-esophageal reflux disease without esophagitis: K21.9

## 2012-05-23 MED ORDER — GI COCKTAIL ~~LOC~~
30.0000 mL | Freq: Once | ORAL | Status: AC
  Administered 2012-05-23: 30 mL via ORAL
  Filled 2012-05-23: qty 30

## 2012-05-23 MED ORDER — RANITIDINE HCL 150 MG PO TABS: 150.0000 mg | ORAL_TABLET | Freq: Two times a day (BID) | ORAL | Status: DC

## 2012-05-23 NOTE — ED Notes (Signed)
PT. REPORTS PERSISTENT HICCUPS ONSET LAST NIGHT , UNRELIEVED BY PRILOSEC - STATES HISTORY OF GERD.

## 2012-05-23 NOTE — ED Provider Notes (Signed)
Medical screening examination/treatment/procedure(s) were conducted as a shared visit with non-physician practitioner(s) and myself.  I personally evaluated the patient during the encounter  51 year old female complains of a burning in the chest that extends from the epigastrium and radiates up into the throat, worse with laying down and associated with hiccups for the last 24 hours. She denies shortness of breath, swelling, back pain, cough, fever. On exam the patient has no epigastric tenderness, lungs are clear heart is regular, EKG shows no signs of ischemia and the patient was given a GI cocktail with significant improvement. Antihistamine started 4 as a control, patient encouraged to followup closely. Patient appears to for discharge at this time  Vida Roller, MD 05/23/12 [phone]

## 2012-05-23 NOTE — ED Provider Notes (Signed)
History     CSN: [id number]  Arrival date & time 05/23/12  0139   First MD Initiated Contact with Patient 05/23/12 0404      Chief Complaint  Patient presents with  . Hiccups    (Consider location/radiation/quality/duration/timing/severity/associated sxs/prior treatment) HPI Comments: Patient present with a history of hiccups for the past 8 hours. She reports having an upset stomach for the past 2 days which feels like indigestion, according to the patient. She has noted a burning sensation in her chest that is unrelieved by prilosec and pepto bismol. She says the hiccups have been constant. She denies fever, NVD, abdominal pain, chest pain, and SOB.    Past Medical History  Diagnosis Date  . GERD (gastroesophageal reflux disease)     Past Surgical History  Procedure Date  . Tonsillectomy   . Vaginal hysterectomy     Family History  Problem Relation Age of Onset  . Hypertension Mother   . Hyperlipidemia Mother     History  Substance Use Topics  . Smoking status: Current Everyday Smoker -- 0.5 packs/day for 31 years    Types: Cigarettes  . Smokeless tobacco: Never Used  . Alcohol Use: 1.2 oz/week    2 Glasses of wine per week    OB History    Grav Para Term Preterm Abortions TAB SAB Ect Mult Living                  Review of Systems  Constitutional: Positive for fatigue. Negative for fever, chills and diaphoresis.  HENT: Negative for sore throat and voice change.   Respiratory: Negative for cough, chest tightness and shortness of breath.   Cardiovascular: Negative for chest pain.  Gastrointestinal: Negative for nausea, vomiting, abdominal pain and diarrhea.  Musculoskeletal: Negative for back pain.  Skin: Negative for color change.  Neurological: Negative for dizziness, light-headedness and headaches.    Allergies  Review of patient's allergies indicates no known allergies.  Home Medications   Current Outpatient Rx  Name Route Sig Dispense Refill  .  ALBUTEROL SULFATE HFA 108 (90 BASE) MCG/ACT IN AERS Inhalation Inhale 2 puffs into the lungs every 6 (six) hours as needed. For shortness of breath    . ASPIRIN 81 MG PO CHEW Oral Chew 81 mg by mouth daily.    Marland Kitchen CETIRIZINE HCL 10 MG PO TABS Oral Take 10 mg by mouth daily as needed. Allergies.    . IBUPROFEN 200 MG PO TABS Oral Take 800 mg by mouth every 6 (six) hours as needed. Pain.    . MELOXICAM 7.5 MG PO TABS Oral Take 1 tablet (7.5 mg total) by mouth daily. 30 tablet 0    BP 125/72  Pulse 68  Temp 98.2 F (36.8 C) (Oral)  Resp 20  SpO2 100%  Physical Exam  Nursing note and vitals reviewed. Constitutional: She appears well-developed and well-nourished. No distress.       During exam and interview, the patient hiccuped once.   HENT:  Head: Normocephalic and atraumatic.  Mouth/Throat: Oropharynx is clear and moist. No oropharyngeal exudate.  Eyes: Conjunctivae are normal. Pupils are equal, round, and reactive to light. No scleral icterus.  Neck: Normal range of motion.  Cardiovascular: Normal rate and regular rhythm.  Exam reveals no gallop and no friction rub.   No murmur heard. Pulmonary/Chest: Effort normal and breath sounds normal. No respiratory distress. She has no wheezes. She has no rales. She exhibits no tenderness.  Abdominal: Soft. She exhibits no distension.  There is no tenderness. There is no rebound.  Musculoskeletal: Normal range of motion.  Neurological: She is alert.  Skin: Skin is warm and dry. She is not diaphoretic.  Psychiatric: She has a normal mood and affect. Her behavior is normal.    ED Course  Procedures (including critical care time)   Date: 05/23/2012  Rate: 50  Rhythm: normal sinus rhythm  QRS Axis: normal  Intervals: normal  ST/T Wave abnormalities: normal  Conduction Disutrbances:none  Narrative Interpretation: normal sinus rhythm  Old EKG Reviewed: unchanged    Labs Reviewed - No data to display No results found.   No diagnosis  found.    MDM  6:08 AM Patient presented with hiccups and a burning sensation in her chest. When I was in the room earlier, she hiccuped once. I ordered a GI cocktail for her indigestion. She reports relief of the burning sensation in her chest. She denies any relief with her hiccups although she did not hiccup when I was just in the room. Her EKG reveals no concerning changes. She will be discharged with Zantac and encouraged to follow up if symptoms persist past 1 week. Plan discussed with Dr. Hyacinth Meeker who is agreeable.         Emilia Beck, [place] 05/23/12 [phone]

## 2012-05-23 NOTE — ED Notes (Signed)
The patient is AOx4 and comfortable with her discharge instructions. 

## 2012-05-23 NOTE — ED Notes (Signed)
Pt went to car for hiccups unable to locate when called for room

## 2012-05-26 ENCOUNTER — Encounter (HOSPITAL_COMMUNITY): Payer: Self-pay | Admitting: *Deleted

## 2012-05-26 ENCOUNTER — Emergency Department (HOSPITAL_COMMUNITY): Payer: Self-pay

## 2012-05-26 ENCOUNTER — Emergency Department (HOSPITAL_COMMUNITY)
Admission: EM | Admit: 2012-05-26 | Discharge: 2012-05-26 | Disposition: A | Discharge status: Home / Self Care | Payer: Self-pay | Attending: Emergency Medicine | Admitting: Emergency Medicine

## 2012-05-26 DIAGNOSIS — K219 Gastro-esophageal reflux disease without esophagitis: Secondary | ICD-10-CM | POA: Insufficient documentation

## 2012-05-26 DIAGNOSIS — R066 Hiccough: Secondary | ICD-10-CM | POA: Insufficient documentation

## 2012-05-26 DIAGNOSIS — F17200 Nicotine dependence, unspecified, uncomplicated: Secondary | ICD-10-CM | POA: Insufficient documentation

## 2012-05-26 DIAGNOSIS — R110 Nausea: Secondary | ICD-10-CM | POA: Insufficient documentation

## 2012-05-26 DIAGNOSIS — Z7982 Long term (current) use of aspirin: Secondary | ICD-10-CM | POA: Insufficient documentation

## 2012-05-26 DIAGNOSIS — Z79899 Other long term (current) drug therapy: Secondary | ICD-10-CM | POA: Insufficient documentation

## 2012-05-26 LAB — COMPREHENSIVE METABOLIC PANEL
ALT: 25 U/L (ref 0–35)
Albumin: 3.6 g/dL (ref 3.5–5.2)
Alkaline Phosphatase: 80 U/L (ref 39–117)
Calcium: 9.3 mg/dL (ref 8.4–10.5)
GFR calc Af Amer: >90 mL/min (ref >90)
Glucose, Bld: 92 mg/dL (ref 70–99)
Potassium: 3.6 mEq/L (ref 3.5–5.1)
Sodium: 141 mEq/L (ref 135–145)
Total Protein: 6.5 g/dL (ref 6.0–8.3)

## 2012-05-26 LAB — CBC WITH DIFFERENTIAL/PLATELET
Basophils Relative: 0 % (ref 0–1)
Eosinophils Relative: 3 % (ref 0–5)
HCT: 38.7 % (ref 36.0–46.0)
Hemoglobin: 12.8 g/dL (ref 12.0–15.0)
Lymphocytes Relative: 24 % (ref 12–46)
Lymphs Abs: 2.5 10*3/uL (ref 0.7–4.0)
MCHC: 33.1 g/dL (ref 30.0–36.0)
Neutro Abs: 6.8 10*3/uL (ref 1.7–7.7)
Neutrophils Relative %: 65 % (ref 43–77)

## 2012-05-26 LAB — CK TOTAL AND CKMB (NOT AT ARMC)
CK, MB: 1.5 ng/mL (ref 0.3–4.0)
Relative Index: 1 (ref 0.0–2.5)
Total CK: 156 U/L (ref 7–177)

## 2012-05-26 LAB — LIPASE, BLOOD: Lipase: 47 U/L (ref 11–59)

## 2012-05-26 MED ORDER — SODIUM CHLORIDE 0.9 % IV SOLN
Freq: Once | INTRAVENOUS | Status: AC
Start: 2012-05-26 — End: 2012-05-26
  Administered 2012-05-26: 10:00:00 via INTRAVENOUS

## 2012-05-26 MED ORDER — METOCLOPRAMIDE HCL 10 MG PO TABS: 10.0000 mg | ORAL_TABLET | Freq: Four times a day (QID) | ORAL | Status: DC | PRN

## 2012-05-26 MED ORDER — METOCLOPRAMIDE HCL 5 MG/ML IJ SOLN
10.0000 mg | Freq: Once | INTRAMUSCULAR | Status: AC
  Administered 2012-05-26: 10 mg via INTRAVENOUS
  Filled 2012-05-26: qty 2

## 2012-05-26 NOTE — ED Notes (Signed)
Pt reports hiccups and mid-sternum chest "burning" x1 week. Pt denies back/abd pain, N/V/D, sob, weakness, dizziness, or cough. Pt seen here fro the same.

## 2012-05-26 NOTE — ED Notes (Signed)
Patient transported to X-ray 

## 2012-05-26 NOTE — ED Provider Notes (Signed)
History     CSN: [id number]  Arrival date & time 05/26/12  0716   First MD Initiated Contact with Patient 05/26/12 [phone]      Chief Complaint  Patient presents with  . Hiccups    (Consider location/radiation/quality/duration/timing/severity/associated sxs/prior treatment) HPI Comments: 51 y/o female with worsening hiccups since Monday. She was seen in ED on 7/31 and diagnosed with indigestion. She was given a prescription for ranitidine which provides no relief of her symptoms. Admits to chest discomfort with the hiccups, but denies any heartburn. Hiccups worse when she is sitting up, relieved when laying down. Admits to nausea and vomiting over the past 2 days. Denies pain with swallowing, fever, chills, diaphoresis, diarrhea, shortness of breath. Patient is a smoker.   The history is provided by the patient.    Past Medical History  Diagnosis Date  . GERD (gastroesophageal reflux disease)     Past Surgical History  Procedure Date  . Tonsillectomy   . Vaginal hysterectomy     Family History  Problem Relation Age of Onset  . Hypertension Mother   . Hyperlipidemia Mother     History  Substance Use Topics  . Smoking status: Current Everyday Smoker -- 0.5 packs/day for 31 years    Types: Cigarettes  . Smokeless tobacco: Never Used  . Alcohol Use: 1.2 oz/week    2 Glasses of wine per week    OB History    Grav Para Term Preterm Abortions TAB SAB Ect Mult Living                  Review of Systems  Constitutional: Negative for fever, chills and diaphoresis.  HENT: Negative for trouble swallowing.        Hiccups  Respiratory: Negative for shortness of breath.   Cardiovascular: Positive for chest pain.  Gastrointestinal: Positive for nausea and vomiting. Negative for diarrhea.    Allergies  Review of patient's allergies indicates no known allergies.  Home Medications   Current Outpatient Rx  Name Route Sig Dispense Refill  . ALBUTEROL SULFATE HFA 108 (90  BASE) MCG/ACT IN AERS Inhalation Inhale 2 puffs into the lungs every 6 (six) hours as needed. For shortness of breath    . ASPIRIN 81 MG PO CHEW Oral Chew 81 mg by mouth daily.    Marland Kitchen CETIRIZINE HCL 10 MG PO TABS Oral Take 10 mg by mouth daily as needed. Allergies.    . IBUPROFEN 200 MG PO TABS Oral Take 800 mg by mouth every 6 (six) hours as needed. Pain.    . MELOXICAM 7.5 MG PO TABS Oral Take 1 tablet (7.5 mg total) by mouth daily. 30 tablet 0  . RANITIDINE HCL 150 MG PO TABS Oral Take 1 tablet (150 mg total) by mouth 2 (two) times daily. 60 tablet 0    BP 113/64  Pulse 50  Temp 98.2 F (36.8 C) (Oral)  Resp 18  SpO2 100%  Physical Exam  Nursing note and vitals reviewed. Constitutional: She is oriented to person, place, and time. She appears well-developed and well-nourished. No distress.       Patient is actively hiccuping every 10-15 seconds.  HENT:  Head: Normocephalic and atraumatic.  Nose: Nose normal.  Mouth/Throat: Uvula is midline, oropharynx is clear and moist and mucous membranes are normal.  Eyes: Conjunctivae and EOM are normal. Pupils are equal, round, and reactive to light.  Neck: Neck supple.  Cardiovascular: Normal rate, regular rhythm, normal heart sounds and intact distal pulses.  No extremity edema  Pulmonary/Chest: Effort normal and breath sounds normal.  Abdominal: Soft. Normal appearance and bowel sounds are normal. There is tenderness in the epigastric area. There is no rigidity, no rebound, no guarding, no tenderness at McBurney's point and negative Murphy's sign.       Radiating discomfort to chest upon palpation of mid-epigastric region.  Neurological: She is alert and oriented to person, place, and time.  Skin: Skin is warm and dry. No rash noted.  Psychiatric: She has a normal mood and affect. Her behavior is normal.    ED Course  Procedures (including critical care time)   Labs Reviewed  CBC WITH DIFFERENTIAL  LIPASE, BLOOD  COMPREHENSIVE  METABOLIC PANEL   Dg Chest 2 View  05/26/2012  *RADIOLOGY REPORT*  Clinical Data: hiccups  CHEST - 2 VIEW  Comparison: 10/31/2006  Findings: The heart size and mediastinal contours are within normal limits.  Both lungs are clear.  The visualized skeletal structures are unremarkable.  IMPRESSION: Negative exam.  Original Report Authenticated By: Rosealee Albee, M.D.   Results for orders placed during the hospital encounter of 05/26/12  CBC WITH DIFFERENTIAL      Component Value Range   WBC 10.5  4.0 - 10.5 K/uL   RBC 4.40  3.87 - 5.11 MIL/uL   Hemoglobin 12.8  12.0 - 15.0 g/dL   HCT 45.4  09.8 - 11.9 %   MCV 88.0  78.0 - 100.0 fL   MCH 29.1  26.0 - 34.0 pg   MCHC 33.1  30.0 - 36.0 g/dL   RDW 14.7  82.9 - 56.2 %   Platelets 222  150 - 400 K/uL   Neutrophils Relative 65  43 - 77 %   Neutro Abs 6.8  1.7 - 7.7 K/uL   Lymphocytes Relative 24  12 - 46 %   Lymphs Abs 2.5  0.7 - 4.0 K/uL   Monocytes Relative 8  3 - 12 %   Monocytes Absolute 0.8  0.1 - 1.0 K/uL   Eosinophils Relative 3  0 - 5 %   Eosinophils Absolute 0.3  0.0 - 0.7 K/uL   Basophils Relative 0  0 - 1 %   Basophils Absolute 0.0  0.0 - 0.1 K/uL  LIPASE, BLOOD      Component Value Range   Lipase 47  11 - 59 U/L  COMPREHENSIVE METABOLIC PANEL      Component Value Range   Sodium 141  135 - 145 mEq/L   Potassium 3.6  3.5 - 5.1 mEq/L   Chloride 102  96 - 112 mEq/L   CO2 32  19 - 32 mEq/L   Glucose, Bld 92  70 - 99 mg/dL   BUN 12  6 - 23 mg/dL   Creatinine, Ser 1.30  0.50 - 1.10 mg/dL   Calcium 9.3  8.4 - 86.5 mg/dL   Total Protein 6.5  6.0 - 8.3 g/dL   Albumin 3.6  3.5 - 5.2 g/dL   AST 16  0 - 37 U/L   ALT 25  0 - 35 U/L   Alkaline Phosphatase 80  39 - 117 U/L   Total Bilirubin 0.4  0.3 - 1.2 mg/dL   GFR calc non Af Amer >90  >90 mL/min   GFR calc Af Amer >90  >90 mL/min  POCT I-STAT TROPONIN I      Component Value Range   Troponin i, poc 0.01  0.00 - 0.08 ng/mL   Comment 3  CK TOTAL AND CKMB       Component Value Range   Total CK 156  7 - 177 U/L   CK, MB 1.5  0.3 - 4.0 ng/mL   Relative Index 1.0  0.0 - 2.5     Date: 05/26/2012  Rate: 43  Rhythm: sinus bradycardia  QRS Axis: normal  Intervals: normal  ST/T Wave abnormalities: normal  Conduction Disutrbances:none  Old EKG Reviewed: unchanged    1. Hiccups   2. Nausea       MDM  10:17 AM Patient states some mild improvement in hiccups since receiving reglan. No more nausea. Resting comfortably and in NAD. 10:33 AM All labs normal. CXR normal. Patients symptoms improving with reglan. Discharge home with reglan and number for GI follow up. Case discussed with Dr. Chaney Malling who agrees with plan of care.       Trevor Mace, PA-C 05/26/12 1034

## 2012-05-26 NOTE — ED Notes (Signed)
Reports having hiccups for a week. Was here this week, given prescriptions but no relief. No acute distress noted at triage.

## 2012-05-26 NOTE — Discharge Instructions (Signed)
Continue taking ranitidine. Take reglan as prescribed. Follow up with GI as advised. Nausea, Adult Nausea is the feeling that you have an upset stomach or have to vomit. Nausea by itself is not likely a serious concern, but it may be an early sign of more serious medical problems. As nausea gets worse, it can lead to vomiting. If vomiting develops, there is the risk of dehydration.  CAUSES   Viral infections.   Food poisoning.   Medicines.   Pregnancy.   Motion sickness.   Migraine headaches.   Emotional distress.   Severe pain from any source.   Alcohol intoxication.  HOME CARE INSTRUCTIONS  Get plenty of rest.   Ask your caregiver about specific rehydration instructions.   Eat small amounts of food and sip liquids more often.   Take all medicines as told by your caregiver.  SEEK MEDICAL CARE IF:  You have not improved after 2 days, or you get worse.   You have a headache.  SEEK IMMEDIATE MEDICAL CARE IF:   You have a fever.   You faint.   You keep vomiting or have blood in your vomit.   You are extremely weak or dehydrated.   You have dark or bloody stools.   You have severe chest or abdominal pain.  MAKE SURE YOU:  Understand these instructions.   Will watch your condition.   Will get help right away if you are not doing well or get worse.  Document Released: 11/17/2004 Document Revised: 09/29/2011 Document Reviewed: 06/22/2011 [employer] Patient Information 2012 [place], [place].Nausea, Adult Nausea is the feeling that you have an upset stomach or have to vomit. Nausea by itself is not likely a serious concern, but it may be an early sign of more serious medical problems. As nausea gets worse, it can lead to vomiting. If vomiting develops, there is the risk of dehydration.  CAUSES   Viral infections.   Food poisoning.   Medicines.   Pregnancy.   Motion sickness.   Migraine headaches.   Emotional distress.   Severe pain from any source.    Alcohol intoxication.  HOME CARE INSTRUCTIONS  Get plenty of rest.   Ask your caregiver about specific rehydration instructions.   Eat small amounts of food and sip liquids more often.   Take all medicines as told by your caregiver.  SEEK MEDICAL CARE IF:  You have not improved after 2 days, or you get worse.   You have a headache.  SEEK IMMEDIATE MEDICAL CARE IF:   You have a fever.   You faint.   You keep vomiting or have blood in your vomit.   You are extremely weak or dehydrated.   You have dark or bloody stools.   You have severe chest or abdominal pain.  MAKE SURE YOU:  Understand these instructions.   Will watch your condition.   Will get help right away if you are not doing well or get worse.  Document Released: 11/17/2004 Document Revised: 09/29/2011 Document Reviewed: 06/22/2011 [facility] Patient Information 2012 [place], [place].

## 2012-05-26 NOTE — ED Provider Notes (Signed)
Medical screening examination/treatment/procedure(s) were performed by non-physician practitioner and as supervising physician I was immediately available for consultation/collaboration.    Richardean Canal, MD 05/26/12 1045

## 2012-06-04 ENCOUNTER — Emergency Department (HOSPITAL_COMMUNITY)
Admission: EM | Admit: 2012-06-04 | Discharge: 2012-06-05 | Disposition: A | Discharge status: Home / Self Care | Payer: Self-pay | Attending: Emergency Medicine | Admitting: Emergency Medicine

## 2012-06-04 ENCOUNTER — Encounter (HOSPITAL_COMMUNITY): Payer: Self-pay | Admitting: Emergency Medicine

## 2012-06-04 DIAGNOSIS — F17200 Nicotine dependence, unspecified, uncomplicated: Secondary | ICD-10-CM | POA: Insufficient documentation

## 2012-06-04 DIAGNOSIS — Z79899 Other long term (current) drug therapy: Secondary | ICD-10-CM | POA: Insufficient documentation

## 2012-06-04 DIAGNOSIS — R112 Nausea with vomiting, unspecified: Secondary | ICD-10-CM

## 2012-06-04 DIAGNOSIS — K219 Gastro-esophageal reflux disease without esophagitis: Secondary | ICD-10-CM | POA: Insufficient documentation

## 2012-06-04 DIAGNOSIS — R1110 Vomiting, unspecified: Secondary | ICD-10-CM | POA: Insufficient documentation

## 2012-06-04 LAB — BASIC METABOLIC PANEL
Chloride: 101 mEq/L (ref 96–112)
GFR calc Af Amer: >90 mL/min (ref >90)
GFR calc non Af Amer: >90 mL/min (ref >90)
Potassium: 3.9 mEq/L (ref 3.5–5.1)
Sodium: 138 mEq/L (ref 135–145)

## 2012-06-04 LAB — LIPASE, BLOOD: Lipase: 34 U/L (ref 11–59)

## 2012-06-04 LAB — CBC WITH DIFFERENTIAL/PLATELET
Basophils Relative: 0 % (ref 0–1)
Basophils Relative: 0 % (ref 0–1)
Eosinophils Absolute: 0.2 10*3/uL (ref 0.0–0.7)
HCT: 40.3 % (ref 36.0–46.0)
Hemoglobin: 14.5 g/dL (ref 12.0–15.0)
Hemoglobin: 13.5 g/dL (ref 12.0–15.0)
Lymphocytes Relative: 19 % (ref 12–46)
Lymphs Abs: 2.5 10*3/uL (ref 0.7–4.0)
Lymphs Abs: 2.5 10*3/uL (ref 0.7–4.0)
MCH: 29.7 pg (ref 26.0–34.0)
MCHC: 33.5 g/dL (ref 30.0–36.0)
Monocytes Relative: 6 % (ref 3–12)
Neutro Abs: 9.8 10*3/uL — ABNORMAL HIGH (ref 1.7–7.7)
Neutro Abs: 9.6 10*3/uL — ABNORMAL HIGH (ref 1.7–7.7)
Neutrophils Relative %: 73 % (ref 43–77)
Neutrophils Relative %: 74 % (ref 43–77)
Platelets: 271 10*3/uL (ref 150–400)
RBC: 4.88 MIL/uL (ref 3.87–5.11)
RBC: 4.54 MIL/uL (ref 3.87–5.11)
WBC: 13.1 10*3/uL — ABNORMAL HIGH (ref 4.0–10.5)

## 2012-06-04 LAB — COMPREHENSIVE METABOLIC PANEL
Albumin: 3.7 g/dL (ref 3.5–5.2)
Alkaline Phosphatase: 92 U/L (ref 39–117)
BUN: 17 mg/dL (ref 6–23)
CO2: 26 mEq/L (ref 19–32)
Chloride: 104 mEq/L (ref 96–112)
GFR calc non Af Amer: 80 mL/min — ABNORMAL LOW (ref >90)
Glucose, Bld: 112 mg/dL — ABNORMAL HIGH (ref 70–99)
Potassium: 4 mEq/L (ref 3.5–5.1)
Total Bilirubin: 0.4 mg/dL (ref 0.3–1.2)

## 2012-06-04 MED ORDER — SODIUM CHLORIDE 0.9 % IV BOLUS (SEPSIS)
1000.0000 mL | Freq: Once | INTRAVENOUS | Status: AC
  Administered 2012-06-04: 1000 mL via INTRAVENOUS

## 2012-06-04 MED ORDER — ONDANSETRON HCL 4 MG/2ML IJ SOLN
4.0000 mg | Freq: Once | INTRAMUSCULAR | Status: AC
  Administered 2012-06-04: 4 mg via INTRAVENOUS
  Filled 2012-06-04: qty 2

## 2012-06-04 MED ORDER — IOHEXOL 300 MG/ML  SOLN
20.0000 mL | INTRAMUSCULAR | Status: AC
  Administered 2012-06-04: 20 mL via ORAL

## 2012-06-04 MED ORDER — PANTOPRAZOLE SODIUM 40 MG IV SOLR
40.0000 mg | Freq: Once | INTRAVENOUS | Status: AC
  Administered 2012-06-04: 40 mg via INTRAVENOUS
  Filled 2012-06-04: qty 40

## 2012-06-04 NOTE — ED Provider Notes (Signed)
History     CSN: [id number]  Arrival date & time 06/04/12  1831   First MD Initiated Contact with Patient 06/04/12 2050      Chief Complaint  Patient presents with  . Emesis    (Consider location/radiation/quality/duration/timing/severity/associated sxs/prior treatment) HPI  51 year old female with history of GERD presents complaining of emesis. Patient reports she has been vomiting for the past 4 days, unable to keep down any food or liquid.  Patient reports initially she was having hiccups last week she was in the ED and was given Reglan. She reports he has resolved however she has had persistent vomiting. States she's unable to keep anything down. Denies trouble swallowing, but does endorse nausea whenever she eats or drink. Her last bowel movement was this morning and had some loose stools.  She denies fever, chills, chest pain, shortness of breath, abdominal pain, or back pain. Endorse appetite but afraid to eat. She is scheduled to be seen by her GI Dr. on the 26th, but unable to wait for that long. Denies any recent alcohol use.  Denies any significant weight changes, or body aches. Does endorse occasional night sweats but states she has a hysterectomy and has been having hot flash. Has not had endoscopy or colonoscopy done yet. No prior hx of abd surgery.    Past Medical History  Diagnosis Date  . GERD (gastroesophageal reflux disease)     Past Surgical History  Procedure Date  . Tonsillectomy   . Vaginal hysterectomy     Family History  Problem Relation Age of Onset  . Hypertension Mother   . Hyperlipidemia Mother     History  Substance Use Topics  . Smoking status: Current Everyday Smoker -- 0.5 packs/day for 31 years    Types: Cigarettes  . Smokeless tobacco: Never Used  . Alcohol Use: 1.2 oz/week    2 Glasses of wine per week    OB History    Grav Para Term Preterm Abortions TAB SAB Ect Mult Living                  Review of Systems  All other systems  reviewed and are negative.    Allergies  Review of patient's allergies indicates no known allergies.  Home Medications   Current Outpatient Rx  Name Route Sig Dispense Refill  . ALBUTEROL SULFATE HFA 108 (90 BASE) MCG/ACT IN AERS Inhalation Inhale 2 puffs into the lungs every 6 (six) hours as needed. For shortness of breath    . ASPIRIN 81 MG PO CHEW Oral Chew 81 mg by mouth daily.    Marland Kitchen CETIRIZINE HCL 10 MG PO TABS Oral Take 10 mg by mouth daily as needed. Allergies.    . IBUPROFEN 200 MG PO TABS Oral Take 800 mg by mouth every 6 (six) hours as needed. Pain.    . MELOXICAM 7.5 MG PO TABS Oral Take 1 tablet (7.5 mg total) by mouth daily. 30 tablet 0  . METOCLOPRAMIDE HCL 10 MG PO TABS Oral Take 1 tablet (10 mg total) by mouth every 6 (six) hours as needed (nausea/headache). 6 tablet 0  . RANITIDINE HCL 150 MG PO TABS Oral Take 1 tablet (150 mg total) by mouth 2 (two) times daily. 60 tablet 0    BP 114/79  Pulse 71  Temp 98.8 F (37.1 C) (Oral)  Resp 18  SpO2 98%  Physical Exam  Nursing note and vitals reviewed. Constitutional: She is oriented to person, place, and time. She appears  well-developed and well-nourished. No distress.       Awake, alert, nontoxic appearance  HENT:  Head: Atraumatic.  Mouth/Throat: Oropharynx is clear and moist.  Eyes: Conjunctivae are normal. Right eye exhibits no discharge. Left eye exhibits no discharge.  Neck: Normal range of motion. Neck supple. No JVD present. No tracheal deviation present. No thyromegaly present.  Cardiovascular: Normal rate and regular rhythm.   Pulmonary/Chest: Effort normal. No respiratory distress. She exhibits no tenderness.  Abdominal: Soft. There is no tenderness. There is no rebound and no guarding.  Musculoskeletal: Normal range of motion. She exhibits no edema and no tenderness.       ROM appears intact, no obvious focal weakness  Lymphadenopathy:    She has no cervical adenopathy.  Neurological: She is alert and  oriented to person, place, and time.       Mental status and motor strength appears intact  Skin: Skin is warm. No rash noted.  Psychiatric: She has a normal mood and affect.    ED Course  Procedures (including critical care time)  Labs Reviewed  CBC WITH DIFFERENTIAL - Abnormal; Notable for the following:    WBC 13.4 (*)     Neutro Abs 9.8 (*)     All other components within normal limits  BASIC METABOLIC PANEL - Abnormal; Notable for the following:    Glucose, Bld 111 (*)     All other components within normal limits   No results found.   No diagnosis found.   Date: 06/04/2012  Rate: 60  Rhythm: normal sinus rhythm  QRS Axis: normal  Intervals: normal  ST/T Wave abnormalities: normal  Conduction Disutrbances:none  Narrative Interpretation:   Old EKG Reviewed: unchanged  Results for orders placed during the hospital encounter of 06/04/12  CBC WITH DIFFERENTIAL      Component Value Range   WBC 13.4 (*) 4.0 - 10.5 K/uL   RBC 4.88  3.87 - 5.11 MIL/uL   Hemoglobin 14.5  12.0 - 15.0 g/dL   HCT 16.1  09.6 - 04.5 %   MCV 88.7  78.0 - 100.0 fL   MCH 29.7  26.0 - 34.0 pg   MCHC 33.5  30.0 - 36.0 g/dL   RDW 40.9  81.1 - 91.4 %   Platelets 271  150 - 400 K/uL   Neutrophils Relative 73  43 - 77 %   Neutro Abs 9.8 (*) 1.7 - 7.7 K/uL   Lymphocytes Relative 19  12 - 46 %   Lymphs Abs 2.5  0.7 - 4.0 K/uL   Monocytes Relative 6  3 - 12 %   Monocytes Absolute 0.9  0.1 - 1.0 K/uL   Eosinophils Relative 2  0 - 5 %   Eosinophils Absolute 0.2  0.0 - 0.7 K/uL   Basophils Relative 0  0 - 1 %   Basophils Absolute 0.0  0.0 - 0.1 K/uL  BASIC METABOLIC PANEL      Component Value Range   Sodium 138  135 - 145 mEq/L   Potassium 3.9  3.5 - 5.1 mEq/L   Chloride 101  96 - 112 mEq/L   CO2 24  19 - 32 mEq/L   Glucose, Bld 111 (*) 70 - 99 mg/dL   BUN 16  6 - 23 mg/dL   Creatinine, Ser 7.82  0.50 - 1.10 mg/dL   Calcium 9.9  8.4 - 95.6 mg/dL   GFR calc non Af Amer >90  >90 mL/min   GFR  calc Af  Amer >90  >90 mL/min  CBC WITH DIFFERENTIAL      Component Value Range   WBC 13.1 (*) 4.0 - 10.5 K/uL   RBC 4.54  3.87 - 5.11 MIL/uL   Hemoglobin 13.5  12.0 - 15.0 g/dL   HCT 30.8  65.7 - 84.6 %   MCV 88.8  78.0 - 100.0 fL   MCH 29.7  26.0 - 34.0 pg   MCHC 33.5  30.0 - 36.0 g/dL   RDW 96.2  95.2 - 84.1 %   Platelets 240  150 - 400 K/uL   Neutrophils Relative 74  43 - 77 %   Neutro Abs 9.6 (*) 1.7 - 7.7 K/uL   Lymphocytes Relative 19  12 - 46 %   Lymphs Abs 2.5  0.7 - 4.0 K/uL   Monocytes Relative 6  3 - 12 %   Monocytes Absolute 0.7  0.1 - 1.0 K/uL   Eosinophils Relative 2  0 - 5 %   Eosinophils Absolute 0.2  0.0 - 0.7 K/uL   Basophils Relative 0  0 - 1 %   Basophils Absolute 0.1  0.0 - 0.1 K/uL  COMPREHENSIVE METABOLIC PANEL      Component Value Range   Sodium 141  135 - 145 mEq/L   Potassium 4.0  3.5 - 5.1 mEq/L   Chloride 104  96 - 112 mEq/L   CO2 26  19 - 32 mEq/L   Glucose, Bld 112 (*) 70 - 99 mg/dL   BUN 17  6 - 23 mg/dL   Creatinine, Ser 3.24  0.50 - 1.10 mg/dL   Calcium 9.5  8.4 - 40.1 mg/dL   Total Protein 6.9  6.0 - 8.3 g/dL   Albumin 3.7  3.5 - 5.2 g/dL   AST 12  0 - 37 U/L   ALT 14  0 - 35 U/L   Alkaline Phosphatase 92  39 - 117 U/L   Total Bilirubin 0.4  0.3 - 1.2 mg/dL   GFR calc non Af Amer 80 (*) >90 mL/min   GFR calc Af Amer >90  >90 mL/min  LIPASE, BLOOD      Component Value Range   Lipase 34  11 - 59 U/L  TROPONIN I      Component Value Range   Troponin I <0.30  <0.30 ng/mL   Dg Chest 2 View  05/26/2012  *RADIOLOGY REPORT*  Clinical Data: hiccups  CHEST - 2 VIEW  Comparison: 10/31/2006  Findings: The heart size and mediastinal contours are within normal limits.  Both lungs are clear.  The visualized skeletal structures are unremarkable.  IMPRESSION: Negative exam.  Original Report Authenticated By: Rosealee Albee, M.D.   Ct Abdomen Pelvis W Contrast  06/05/2012  *RADIOLOGY REPORT*  Clinical Data: Mid to lower lower abdominal pain.  Nausea  and vomiting.  CT ABDOMEN AND PELVIS WITH CONTRAST  Technique:  Multidetector CT imaging of the abdomen and pelvis was performed following the standard protocol during bolus administration of intravenous contrast.  Contrast: OMNIPAQUE IOHEXOL 300 MG/ML  SOLN  Comparison: None.  Findings: Lung Bases: Mild dependent atelectasis.  Liver:  Normal.  Spleen:  Normal.  Gallbladder:  Normal.  Partially contracted.  Common bile duct:  Normal.  Pancreas:  Tiny foci of fat in the pancreatic tail. No inflammatory changes or ductal dilation.  Adrenal glands:  Normal.  Kidneys:  Normal enhancement and excretion of contrast.  The ureters appear within normal limits.  Stomach:  Small hiatal  hernia.  Small bowel:  Small bowel within normal limits.  No obstruction.  Colon:   Normal appendix.  Normal.  Pelvic Genitourinary:  Hysterectomy.  Urinary bladder normal.  No free fluid.  Bones:  No aggressive osseous lesions.  Bilateral hip osteoarthritis.  Scattered benign bone islands are present.  Vasculature: Mild atherosclerosis.  IMPRESSION: No acute abnormality.  Hysterectomy.  Original Report Authenticated By: Andreas Newport, M.D.    1. Persistent vomit  MDM  Patient complains of persistent vomiting the past 4 days. However she is in no acute distress. Her vital signs stable and she is afebrile. No oral mucosa moist. No obvious airway obstruction, or oral mucosal edema. Normal voice. Her abdomen is nontender on exam. No Murphy sign.  Plan check electrolytes, EKG, and troponin. We'll rehydrate with IV fluid, will give protonix and Zofran.  Will continue to monitor.    11:12 PM Pt has elevated WBC of 13.  Has been to ER for same several times last week.  Will obtain abd/pelvic Ct for further eval.  My attending is aware.  12:04 AM Pt reports feeling better.  Able to drink all contrast without difficulty.    12:41 AM Abdominal and pelvis CT scan shows no acute abnormality.  Patient subjectively feels better.  Patient is stable to be discharged.  Fayrene Helper, PA-C 06/05/12 [phone]

## 2012-06-04 NOTE — ED Notes (Signed)
Pt c/o vomiting x 4 days; pt sts recent seen here for hiccups and now having vomiting; unable to keep down POs

## 2012-06-05 ENCOUNTER — Emergency Department (HOSPITAL_COMMUNITY): Payer: Self-pay

## 2012-06-05 MED ORDER — IOHEXOL 300 MG/ML  SOLN
100.0000 mL | Freq: Once | INTRAMUSCULAR | Status: AC | PRN
  Administered 2012-06-05: 100 mL via INTRAVENOUS

## 2012-06-05 MED ORDER — ONDANSETRON HCL 4 MG PO TABS: 4.0000 mg | ORAL_TABLET | Freq: Four times a day (QID) | ORAL | Status: AC

## 2012-06-05 NOTE — ED Provider Notes (Signed)
No att. providers found  Flint Melter, MD 06/05/12 [phone]

## 2012-06-15 ENCOUNTER — Ambulatory Visit (INDEPENDENT_AMBULATORY_CARE_PROVIDER_SITE_OTHER): Payer: Self-pay | Admitting: Internal Medicine

## 2012-06-15 ENCOUNTER — Encounter: Payer: Self-pay | Admitting: Internal Medicine

## 2012-06-15 VITALS — BP 135/79 | HR 61 | Temp 97.2°F | Ht 70.0 in | Wt 232.2 lb

## 2012-06-15 DIAGNOSIS — K219 Gastro-esophageal reflux disease without esophagitis: Secondary | ICD-10-CM

## 2012-06-15 DIAGNOSIS — Z0000 Encounter for general adult medical examination without abnormal findings: Secondary | ICD-10-CM

## 2012-06-15 DIAGNOSIS — Z23 Encounter for immunization: Secondary | ICD-10-CM

## 2012-06-15 DIAGNOSIS — Z1211 Encounter for screening for malignant neoplasm of colon: Secondary | ICD-10-CM

## 2012-06-15 MED ORDER — PANTOPRAZOLE SODIUM 40 MG PO TBEC: 40.0000 mg | DELAYED_RELEASE_TABLET | Freq: Every day | ORAL | Status: DC

## 2012-06-15 NOTE — Patient Instructions (Signed)
-  Please start Protonix 40mg  daily.  If your symptoms do not improve, please call us.  This medication should help with your hiccups.  You may continue the other drops that you have (simethicone) for a few days until the protonix kicks in.    Please be sure to bring all of your medications with you to every visit.  Should you have any new or worsening symptoms, please be sure to call the clinic at [phone].

## 2012-06-15 NOTE — Progress Notes (Signed)
Subjective:   Patient ID: [name] female   DOB: Jun 08, 1961 51 y.o.   MRN: [id number]  HPI: [name] is a 51 y.o. woman with history significant for GERD.  Since July - one sided (left side) beneath eye burning, felt like shingles, after use of sea breeze, but swollen so went to [facility] - had script for valacyclovir filled - took one pill - 2 weeks later got hiccups - lasted for 2 weeks  Went to ED and given GI cocktail & ranididine - no relief - then tried gas relief drops (simethicone) - hiccups stopped, but then starting spitting up/vomiting (first food, then green)   - then this morning vomited after egg & toast  Notes burning stomach and chest. +Burping/belching  accompanied with sore throat x 2 weeks  CT scan on 06/04/12 reveals small hiatal hernia   Past Medical History  Diagnosis Date  . GERD (gastroesophageal reflux disease)   . Osteoarthritis     b/l hip, seen on CT abd in 05/2012  . Hiatal hernia     CT abdomen in August 2013   Current Outpatient Prescriptions  Medication Sig Dispense Refill  . aspirin 81 MG chewable tablet Chew 81 mg by mouth daily.      Marland Kitchen ibuprofen (ADVIL,MOTRIN) 200 MG tablet Take 800 mg by mouth every 6 (six) hours as needed. Pain.      . meloxicam (MOBIC) 7.5 MG tablet Take 1 tablet (7.5 mg total) by mouth daily.  30 tablet  0  . ranitidine (ZANTAC) 150 MG tablet Take 1 tablet (150 mg total) by mouth 2 (two) times daily.  60 tablet  0  . albuterol (PROVENTIL HFA;VENTOLIN HFA) 108 (90 BASE) MCG/ACT inhaler Inhale 2 puffs into the lungs every 6 (six) hours as needed. For shortness of breath      . cetirizine (ZYRTEC) 10 MG tablet Take 10 mg by mouth daily as needed. Allergies.       Family History  Problem Relation Age of Onset  . Hypertension Mother   . Hyperlipidemia Mother    History   Social History  . Marital Status: Single    Spouse Name: N/A    Number of Children: 3  . Years of Education: 12th   Occupational History  . 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 Smoking status: Current Some Day Smoker -- 0.2 packs/day for 31 years    Types: Cigarettes  . Smokeless tobacco: Never Used  . Alcohol Use: 1.2 oz/week    2 Glasses of wine per week     1-2 beers/week  . Drug Use: No  . Sexually Active: Yes -- Female partner(s)     Same partner for 11 years   Other Topics Concern  . None   Social History Narrative   Live with her motherHas three kids - all grown - all in [place] area4 grandchildrenBasset Hound at home - 11 years   Review of Systems: Constitutional: Denies fever, chills, diaphoresis, appetite change and fatigue.  HEENT: Denies photophobia, eye pain, redness, hearing loss, ear pain, congestion, sore throat, rhinorrhea, sneezing, mouth sores, trouble swallowing, neck pain, neck stiffness and tinnitus.   Respiratory: Denies SOB, DOE, cough, chest tightness,  and wheezing.   Cardiovascular: Denies chest pain, palpitations and leg swelling.  Gastrointestinal: Denies nausea, abdominal pain, diarrhea, constipation, blood in stool and abdominal distention.  Genitourinary: Denies dysuria, urgency, frequency, hematuria, flank pain and difficulty urinating.  Musculoskeletal: + Chronic bilateral knee pain, would like to explore joint injection in the future Skin: Denies pallor, rash and wound.  Neurological: Denies dizziness, seizures, syncope, weakness, light-headedness, numbness and headaches.  Psychiatric/Behavioral: Denies suicidal ideation, mood changes, confusion, nervousness, sleep disturbance and agitation  Objective:  Physical Exam: Filed Vitals:   06/15/12 1503  BP: 135/79  Pulse: 61  Temp: 97.2 F (36.2 C)  TempSrc: Oral  Height: 5\' 10"  (1.778 m)  Weight: 232 lb 3.2 oz (50.325 kg)  SpO2: 98%   Constitutional: Vital signs reviewed.  Patient is a well-developed and well-nourished woman in no acute distress and cooperative with exam.  Mouth: no erythema or exudates,  MMM, poor dentition Eyes: PERRL, EOMI, conjunctivae normal, No scleral icterus.  Neck: Supple, Trachea midline normal ROM, No mass, thyromegaly Cardiovascular: RRR, S1 normal, S2 normal, no MRG, pulses symmetric and intact bilaterally Pulmonary/Chest: CTAB, no wheezes, rales, or rhonchi Abdominal: Soft. Non-tender, non-distended, bowel sounds are normal, no masses, organomegaly, or guarding present.  Hematology: no cervical adenopathy.  Neurological: A&O x3, Strength is normal and symmetric bilaterally, cranial nerve II-XII are grossly intact, no focal motor deficit, sensory intact to light touch bilaterally.  Skin: Warm, dry and intact. No rash, cyanosis, or clubbing.  Psychiatric: Normal mood and affect. speech and behavior is normal. Judgment and thought content normal. Cognition and memory are normal.   Assessment & Plan:   Case and care discussed with Dr. Eben Burow. Please see problem oriented chart for further details. Patient to return in 3 months for reevaluation of symptoms, or sooner should symptoms persist.

## 2012-06-15 NOTE — Assessment & Plan Note (Signed)
-  tdap given today -No indication for Pap, patient had total hysterectomy -GI referral for colonoscopy  -Patient will need referral for mammogram at next visit

## 2012-06-15 NOTE — Assessment & Plan Note (Signed)
Symptoms, including hiccups, seem related to reflux disease, especially in the setting of hiatal hernia seen on CT abdomen in August 2013.  -Trial of Protonix 40 mg daily for 3 months, then we will discontinue and monitor for symptoms -If symptoms recur, we will proceed with GI referral for reflux disease as patient may require endoscopy to evaluate for Barrett's esophagus -Educated on the benefit of weight loss and dietary changes

## 2012-06-26 ENCOUNTER — Encounter: Payer: Self-pay | Admitting: Gastroenterology

## 2012-07-04 ENCOUNTER — Telehealth: Payer: Self-pay | Admitting: *Deleted

## 2012-07-04 ENCOUNTER — Ambulatory Visit (AMBULATORY_SURGERY_CENTER): Payer: Self-pay | Admitting: *Deleted

## 2012-07-04 VITALS — Ht 70.0 in | Wt 228.6 lb

## 2012-07-04 DIAGNOSIS — Z1211 Encounter for screening for malignant neoplasm of colon: Secondary | ICD-10-CM

## 2012-07-04 MED ORDER — MOVIPREP 100 G PO SOLR: ORAL | Status: DC

## 2012-07-04 NOTE — Telephone Encounter (Signed)
Dr. Jarold Motto:  Pt is scheduled for screening colonoscopy 9/25.  Was seen by Dr. Everardo Beals on 8/23 for GERD symptoms, hiccups. (See her OV note from 8/23.)  She recommends that pt have EGD if symptoms continues.  In PV today, she says that she still is having problems with excessive belching and feeling of food not going down after eating.  No choking on food.  Do you want pt to have EGD when she has colonoscopy or does she need an office visit with you first?  Thanks

## 2012-07-05 NOTE — Telephone Encounter (Signed)
egd is indicated.Marland KitchenMarland Kitchen

## 2012-07-05 NOTE — Telephone Encounter (Signed)
Pt scheduled for Propofol EGD and Colonoscopy on 9/25 at 2:30.  Updated prep instructions reviewed with pt.

## 2012-07-18 ENCOUNTER — Encounter: Payer: Self-pay | Admitting: Gastroenterology

## 2012-07-18 ENCOUNTER — Ambulatory Visit (AMBULATORY_SURGERY_CENTER): Payer: Self-pay | Admitting: Gastroenterology

## 2012-07-18 VITALS — BP 121/68 | HR 71 | Temp 99.1°F | Resp 14 | Ht 70.0 in | Wt 228.0 lb

## 2012-07-18 DIAGNOSIS — Z1211 Encounter for screening for malignant neoplasm of colon: Secondary | ICD-10-CM

## 2012-07-18 DIAGNOSIS — D126 Benign neoplasm of colon, unspecified: Secondary | ICD-10-CM

## 2012-07-18 DIAGNOSIS — K219 Gastro-esophageal reflux disease without esophagitis: Secondary | ICD-10-CM

## 2012-07-18 MED ORDER — SODIUM CHLORIDE 0.9 % IV SOLN: 500.0000 mL | INTRAVENOUS | Status: DC

## 2012-07-18 NOTE — Patient Instructions (Addendum)
Discharge instructions given with verbal understanding. Handouts on polyps,diverticulosis and a hiatal hernia given. Resume previous medications. YOU HAD AN ENDOSCOPIC PROCEDURE TODAY AT THE [facility]: Refer to the procedure report that was given to you for any specific questions about what was found during the examination.  If the procedure report does not answer your questions, please call your gastroenterologist to clarify.  If you requested that your care partner not be given the details of your procedure findings, then the procedure report has been included in a sealed envelope for you to review at your convenience later.  YOU SHOULD EXPECT: Some feelings of bloating in the abdomen. Passage of more gas than usual.  Walking can help get rid of the air that was put into your GI tract during the procedure and reduce the bloating. If you had a lower endoscopy (such as a colonoscopy or flexible sigmoidoscopy) you may notice spotting of blood in your stool or on the toilet paper. If you underwent a bowel prep for your procedure, then you may not have a normal bowel movement for a few days.  DIET: Your first meal following the procedure should be a light meal and then it is ok to progress to your normal diet.  A half-sandwich or bowl of soup is an example of a good first meal.  Heavy or fried foods are harder to digest and may make you feel nauseous or bloated.  Likewise meals heavy in dairy and vegetables can cause extra gas to form and this can also increase the bloating.  Drink plenty of fluids but you should avoid alcoholic beverages for 24 hours.  ACTIVITY: Your care partner should take you home directly after the procedure.  You should plan to take it easy, moving slowly for the rest of the day.  You can resume normal activity the day after the procedure however you should NOT DRIVE or use heavy machinery for 24 hours (because of the sedation medicines used during the test).    SYMPTOMS  TO REPORT IMMEDIATELY: A gastroenterologist can be reached at any hour.  During normal business hours, 8:30 AM to 5:00 PM Monday through Friday, call [phone].  After hours and on weekends, please call the GI answering service at [phone] who will take a message and have the physician on call contact you.   Following lower endoscopy (colonoscopy or flexible sigmoidoscopy):  Excessive amounts of blood in the stool  Significant tenderness or worsening of abdominal pains  Swelling of the abdomen that is new, acute  Fever of 100F or higher  Following upper endoscopy (EGD)  Vomiting of blood or coffee ground material  New chest pain or pain under the shoulder blades  Painful or persistently difficult swallowing  New shortness of breath  Fever of 100F or higher  Black, tarry-looking stools  FOLLOW UP: If any biopsies were taken you will be contacted by phone or by letter within the next 1-3 weeks.  Call your gastroenterologist if you have not heard about the biopsies in 3 weeks.  Our staff will call the home number listed on your records the next business day following your procedure to check on you and address any questions or concerns that you may have at that time regarding the information given to you following your procedure. This is a courtesy call and so if there is no answer at the home number and we have not heard from you through the emergency physician on call, we will assume  that you have returned to your regular daily activities without incident.  SIGNATURES/CONFIDENTIALITY: You and/or your care partner have signed paperwork which will be entered into your electronic medical record.  These signatures attest to the fact that that the information above on your After Visit Summary has been reviewed and is understood.  Full responsibility of the confidentiality of this discharge information lies with you and/or your care-partner.

## 2012-07-18 NOTE — Op Note (Signed)
[facility] [address] [place], [address]   ENDOSCOPY PROCEDURE REPORT  PATIENT: Cheryl, Mitchell  MR#: [id number] BIRTHDATE: 08-30-1961 , 51  yrs. old GENDER: Female ENDOSCOPIST:Desire Fulp Hale Bogus, MD, Clement[facility] Graham REFERRED BY: Kirstie Peri, M.D. PROCEDURE DATE:  07/18/2012 PROCEDURE:   EGD, diagnostic ASA CLASS:    Class II INDICATIONS: history of esophageal reflux. ,recurrent hiccups MEDICATION: There was residual sedation effect present from prior procedure and propofol (Diprivan) 150mg  IV TOPICAL ANESTHETIC:  DESCRIPTION OF PROCEDURE:   After the risks and benefits of the procedure were explained, informed consent was obtained.  The LB GIF-H180 [id number]  endoscope was introduced through the mouth  and advanced to the second portion of the duodenum .  The instrument was slowly withdrawn as the mucosa was fully examined.    The upper, middle and distal third of the esophagus were carefully inspected and no abnormalities were noted.  The z-line was well seen at the GEJ.  The endoscope was pushed into the fundus which was normal including a retroflexed view.  The antrum, gastric body, first and second part of the duodenum were unremarkable.   5 cm. HH noted.     Retroflexed views revealed a hiatal hernia.    The scope was then withdrawn from the patient and the procedure completed.  COMPLICATIONS: There were no complications.   ENDOSCOPIC IMPRESSION: 1.   Normal EGD 2.   5 cm.  HH noted ...treated GERD   RECOMMENDATIONS: 1.  continue PPI 2.  anti-reflux regimen to be follow    _______________________________ eSigned:  Mardella Layman, MD, [facility] 07/18/2012 2:41 PM

## 2012-07-18 NOTE — Op Note (Signed)
[facility] [address] [place], [address]   COLONOSCOPY PROCEDURE REPORT  PATIENT: Cheryl, Mitchell  MR#: [id number] BIRTHDATE: 1961/01/21 , 51  yrs. old GENDER: Female ENDOSCOPIST: Mardella Layman, MD, [facility] REFERRED BY:  Talbert Cage, M.D. PROCEDURE DATE:  07/18/2012 PROCEDURE:   Colonoscopy with snare polypectomy ASA CLASS:   Class II INDICATIONS:average risk patient for colon cancer. MEDICATIONS: propofol (Diprivan) 200mg  IV  DESCRIPTION OF PROCEDURE:   After the risks and benefits and of the procedure were explained, informed consent was obtained.  A digital rectal exam revealed no abnormalities of the rectum.    The LB CF-H180AL [id number]  endoscope was introduced through the anus and advanced to the cecum, which was identified by both the appendix and ileocecal valve .  The quality of the prep was excellent, using MoviPrep .  The instrument was then slowly withdrawn as the colon was fully examined.     COLON FINDINGS: Mild diverticulosis was noted in the descending colon and sigmoid colon.   Two diminutive smooth sessile polyps were found in the sigmoid colon.  A polypectomy was performed using snare cautery with  hot and cold snare.  The resection was complete and the polyp tissue was partially retrieved.   The colon mucosa was otherwise normal.     Retroflexed views revealed no abnormalities.     The scope was then withdrawn from the patient and the procedure completed.  COMPLICATIONS: There were no complications. ENDOSCOPIC IMPRESSION: 1.   Mild diverticulosis was noted in the descending colon and sigmoid colon 2.   Two diminutive sessile polyps were found in the sigmoid colon; polypectomy was performed using snare cautery and with a cold snare  3.   The colon mucosa was otherwise normal  RECOMMENDATIONS: 1.  Repeat colonoscopy in 5 years if polyp adenomatous; otherwise 10 years 2.  High fiber diet   REPEAT  EXAM:  cc:  _______________________________ eSignedMardella Layman, MD, [facility] 07/18/2012 2:31 PM     PATIENT NAME:  Cheryl, Mitchell MR#: [id number]

## 2012-07-18 NOTE — Progress Notes (Signed)
Two polys in sigmoid colon cauterized only.one polyp retrieved.

## 2012-07-19 ENCOUNTER — Telehealth: Payer: Self-pay | Admitting: *Deleted

## 2012-07-19 NOTE — Telephone Encounter (Signed)
Message left

## 2012-07-26 ENCOUNTER — Ambulatory Visit (INDEPENDENT_AMBULATORY_CARE_PROVIDER_SITE_OTHER): Payer: Self-pay | Admitting: Internal Medicine

## 2012-07-26 ENCOUNTER — Encounter: Payer: Self-pay | Admitting: Internal Medicine

## 2012-07-26 VITALS — BP 108/72 | HR 79 | Temp 98.1°F | Ht 70.0 in | Wt 231.8 lb

## 2012-07-26 DIAGNOSIS — M1710 Unilateral primary osteoarthritis, unspecified knee: Secondary | ICD-10-CM

## 2012-07-26 DIAGNOSIS — Z1239 Encounter for other screening for malignant neoplasm of breast: Secondary | ICD-10-CM

## 2012-07-26 DIAGNOSIS — M1712 Unilateral primary osteoarthritis, left knee: Secondary | ICD-10-CM | POA: Insufficient documentation

## 2012-07-26 DIAGNOSIS — Z0000 Encounter for general adult medical examination without abnormal findings: Secondary | ICD-10-CM

## 2012-07-26 DIAGNOSIS — Z79899 Other long term (current) drug therapy: Secondary | ICD-10-CM

## 2012-07-26 LAB — LIPID PANEL
HDL: 41 mg/dL (ref >39)
LDL Cholesterol: 145 mg/dL — ABNORMAL HIGH (ref 0–99)

## 2012-07-26 NOTE — Patient Instructions (Addendum)
-  We injected your knee today - be sure to keep your joint moving, and you may ice it for short periods of time (5-72min) if you have pain  Please be sure to bring all of your medications with you to every visit.  Should you have any new or worsening symptoms, please be sure to call the clinic at [phone].

## 2012-07-26 NOTE — Assessment & Plan Note (Signed)
Declines flu shot. Referral to dental clinic made per patient's request.

## 2012-07-26 NOTE — Assessment & Plan Note (Signed)
Pain mgmt not adequate with just mobic.  She underwent joint aspiration and injection to the left knee in the past.  Patient was injected with kenalog (1mL) + lidocaine (1mL) today.    Consent acquired.  Patient was told that risks of procedure include bleeding, infection and localized pain from needle injection.  Benefit of doing the procedure is possible pain relief.  Risk of not doing to procedure is persistent pain. Time out done - patient's identification & location of procedure and details of procedure verified by patient. Patient instructed to continue knee flexion/extention through the day and apply ice pack for limited amt of time (5-10 min) if needed for pain relief.

## 2012-07-26 NOTE — Progress Notes (Signed)
Subjective:   Patient ID: [name] female   DOB: [birth year] 51 y.o.   MRN: [id number]  HPI: [name] is a 51 y.o.   Left knee, worsening knee pain, minimal relief with mobic, and takes ibuprofen.  Notes increased pop/locking.  Injured in 1993.  Throbbing.  8/10.  Stays around knee.  Worse with walking, but can weight bear. No relieving factors.  Has had knee injection in the past after fluid aspiration. No recent fevers/chills.  Hurts most on superior & lateral aspect of left knee.   Past Medical History  Diagnosis Date  . GERD (gastroesophageal reflux disease)   . Osteoarthritis     b/l hip, seen on CT abd in 05/2012  . Hiatal hernia     CT abdomen in August 2013  . Allergy     uses Albuterol inhaler during allergy season   Current Outpatient Prescriptions  Medication Sig Dispense Refill  . albuterol (PROVENTIL HFA;VENTOLIN HFA) 108 (90 BASE) MCG/ACT inhaler Inhale 2 puffs into the lungs every 6 (six) hours as needed. For shortness of breath      . aspirin 81 MG chewable tablet Chew 81 mg by mouth daily.      Marland Kitchen ibuprofen (ADVIL,MOTRIN) 200 MG tablet Take 800 mg by mouth every 6 (six) hours as needed. Pain.      . meloxicam (MOBIC) 7.5 MG tablet Take 1 tablet (7.5 mg total) by mouth daily.  30 tablet  0  . pantoprazole (PROTONIX) 40 MG tablet Take 1 tablet (40 mg total) by mouth daily.  30 tablet  3  . ranitidine (ZANTAC) 150 MG tablet Take 1 tablet (150 mg total) by mouth 2 (two) times daily.  60 tablet  0   Family History  Problem Relation Age of Onset  . Hypertension Mother   . Hyperlipidemia Mother   . Stroke Father     passed away at 74 yo of cerebal brain hemorrhage   . Colon cancer Neg Hx   . Stomach cancer Neg Hx   . Colon polyps Neg Hx   . Rectal cancer Neg Hx    History   Social History  . Marital Status: Single    Spouse Name: N/A    Number of Children: 3  . Years of Education: 12th   Occupational History  . 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 Smoking status: Current Some Day Smoker -- 0.3 packs/day for 31 years    Types: Cigarettes  . Smokeless tobacco: Never Used  . Alcohol Use: 1.2 oz/week    2 Glasses of wine per week     1-2 beers/week  . Drug Use: No  . Sexually Active: Yes -- Female partner(s)     Same partner for 11 years   Other Topics Concern  . None   Social History Narrative   Live with her motherHas three kids - all grown - all in [place] area4 grandchildrenBasset Hound at home - 11 years   Review of Systems: General: no fevers, chills, changes in weight, changes in appetite Skin: no rash HEENT: no blurry vision, hearing changes, sore throat Pulm: no dyspnea, coughing, wheezing CV: no chest pain, palpitations, shortness of breath Abd: no abdominal pain, nausea/vomiting, diarrhea/constipation GU: no dysuria, hematuria, polyuria Ext: no myalgias Neuro: no weakness, numbness, or tingling   Objective:  Physical Exam: Filed Vitals:   07/26/12 1053  BP: 108/72  Pulse: 79  Temp: 98.1 F (  36.7 C)  TempSrc: Oral  Height: 5\' 10"  (1.778 m)  Weight: 231 lb 12.8 oz (105.144 kg)   Constitutional: Vital signs reviewed.  Patient is a well-developed and well-nourished woman in no acute distress and cooperative with exam.  Eyes: PERRL, EOMI, conjunctivae normal, No scleral icterus.  Cardiovascular: RRR, S1 normal, S2 normal, no MRG, pulses symmetric and intact bilaterally Pulmonary/Chest: CTAB, no wheezes, rales, or rhonchi Abdominal: Soft. Non-tender, non-distended, bowel sounds are normal, no masses, organomegaly, or guarding present.  Musculoskeletal: mild edema of left knee; TTP of lateral and superior aspect of left knee; crepitus of flexion/extension of left knee Neurological: A&O x3, Strength is normal and symmetric bilaterally, cranial nerve II-XII are grossly intact, no focal motor deficit, sensory intact to light touch bilaterally.  Psychiatric: Normal mood and affect.  speech and behavior is normal. Judgment and thought content normal. Cognition and memory are normal.   Assessment & Plan:   Case and care discussed with Dr. Eben Burow.  Dr. Eben Burow & Lynn Ito, CMA were present during knee injection, which I administered.  Patient to return prn.  Please see problem oriented charting for further details.

## 2012-07-30 ENCOUNTER — Encounter: Payer: Self-pay | Admitting: Internal Medicine

## 2012-08-02 ENCOUNTER — Encounter: Payer: Self-pay | Admitting: Internal Medicine

## 2012-08-08 ENCOUNTER — Ambulatory Visit (HOSPITAL_COMMUNITY): Payer: No Typology Code available for payment source | Attending: Internal Medicine

## 2012-08-09 ENCOUNTER — Encounter: Payer: Self-pay | Admitting: *Deleted

## 2012-08-16 ENCOUNTER — Ambulatory Visit: Payer: Self-pay | Admitting: Gastroenterology

## 2012-09-14 ENCOUNTER — Encounter: Payer: Self-pay | Admitting: *Deleted

## 2012-09-18 ENCOUNTER — Ambulatory Visit: Payer: Self-pay | Admitting: Gastroenterology

## 2012-11-09 ENCOUNTER — Ambulatory Visit: Payer: Self-pay | Admitting: Internal Medicine

## 2012-11-13 ENCOUNTER — Encounter: Payer: Self-pay | Admitting: Internal Medicine

## 2012-11-13 ENCOUNTER — Ambulatory Visit (INDEPENDENT_AMBULATORY_CARE_PROVIDER_SITE_OTHER): Payer: No Typology Code available for payment source | Admitting: Internal Medicine

## 2012-11-13 VITALS — BP 116/78 | HR 70 | Temp 97.1°F | Ht 70.0 in | Wt 230.5 lb

## 2012-11-13 DIAGNOSIS — M25569 Pain in unspecified knee: Secondary | ICD-10-CM

## 2012-11-13 DIAGNOSIS — M1712 Unilateral primary osteoarthritis, left knee: Secondary | ICD-10-CM

## 2012-11-13 DIAGNOSIS — M1710 Unilateral primary osteoarthritis, unspecified knee: Secondary | ICD-10-CM

## 2012-11-13 DIAGNOSIS — E785 Hyperlipidemia, unspecified: Secondary | ICD-10-CM

## 2012-11-13 MED ORDER — TRAMADOL HCL 50 MG PO TABS: 50.0000 mg | ORAL_TABLET | Freq: Three times a day (TID) | ORAL | Status: DC | PRN

## 2012-11-13 NOTE — Progress Notes (Signed)
Patient ID: [name], female   DOB: 1960-11-11, 52 y.o.   MRN: [id number]  Subjective:   Patient ID: [name] female   DOB: 13-Sep-1961 51 y.o.   MRN: [id number]  CC:   Acute visit for Left knee pain  HPI:  [name] is a 52 y.o. lady with past medical history as outlined below, who presents for an acute visit today.  Patient reports that she has been having left knee pain for nearly 15 years. She had x-ray on 2009 which showed degenerative joint with effusion. She also reports that she had aspiration in the past which did not show any crystals for gout. Patient has tried various anti-inflammatory medications, including Mobic, ibuprofen, naproxen, Celebrex and Tylenol. She also had local joint injection in last October. She didn't have good relief from any of  These treatments.  Patient reports that in the past 2 weeks, her left knee pain has been getting worse. She also had mild L knee joint edema. She didn't notice an redness or warmth over her left knee joints. She does not have fever or chills.  Past Medical History  Diagnosis Date  . GERD (gastroesophageal reflux disease)   . Osteoarthritis     b/l hip, seen on CT abd in 05/2012  . Hiatal hernia     CT abdomen in August 2013  . Allergy     uses Albuterol inhaler during allergy season  . Diverticulosis 2013    Colonoscopy  . Hx of colonic polyps 2013    Colonoscopy    Current Outpatient Prescriptions  Medication Sig Dispense Refill  . albuterol (PROVENTIL HFA;VENTOLIN HFA) 108 (90 BASE) MCG/ACT inhaler Inhale 2 puffs into the lungs every 6 (six) hours as needed. For shortness of breath      . aspirin 81 MG chewable tablet Chew 81 mg by mouth daily.      Marland Kitchen ibuprofen (ADVIL,MOTRIN) 200 MG tablet Take 800 mg by mouth every 6 (six) hours as needed. Pain.      . pantoprazole (PROTONIX) 40 MG tablet Take 1 tablet (40 mg total) by mouth daily.  30 tablet  3  . traMADol (ULTRAM) 50 MG tablet Take 1 tablet (50 mg total) by  mouth every 8 (eight) hours as needed for pain.  30 tablet  0   Family History  Problem Relation Age of Onset  . Hypertension Mother   . Hyperlipidemia Mother   . Stroke Father     passed away at 27 yo of cerebal brain hemorrhage   . Colon cancer Neg Hx   . Stomach cancer Neg Hx   . Colon polyps Neg Hx   . Rectal cancer Neg Hx    History   Social History  . Marital Status: Single    Spouse Name: N/A    Number of Children: 3  . Years of Education: 12th   Occupational History  . [employer]. [employer]     layed off   Social History Main Topics  . Smoking status: Current Some Day Smoker -- 0.3 packs/day for 31 years    Types: Cigarettes  . Smokeless tobacco: Never Used  . Alcohol Use: 1.2 oz/week    2 Glasses of wine per week     Comment: 1-2 beers/week  . Drug Use: No  . Sexually Active: Yes -- Female partner(s)     Comment: Same partner for 11 years   Other Topics Concern  . None   Social History Narrative  Live with her motherHas three kids - all grown - all in [place] area4 [employer] at home - 11 years    Review of Systems: General: no fevers, chills, no changes in body weight, no changes in appetite Skin: no rash HEENT: no blurry vision, hearing changes or sore throat Pulm: no dyspnea, coughing, wheezing CV: no chest pain, palpitations, shortness of breath Abd: no nausea/vomiting, abdominal pain, diarrhea/constipation GU: no dysuria, hematuria, polyuria Ext: has left knee pain and edema Neuro: no weakness, numbness, or tingling  Objective:  Physical Exam: Filed Vitals:   11/13/12 1024  BP: 116/78  Pulse: 70  Temp: 97.1 F (36.2 C)  TempSrc: Oral  Height: 5\' 10"  (1.778 m)  Weight: 230 lb 8 oz (104.554 kg)  SpO2: 98%   General: Not in acute distress HEENT: PERRL, EOMI, no scleral icterus, No bruit or JVD Cardiac: S1/S2, RRR, No murmurs, gallops or rubs Pulm: Good air movement bilaterally, Clear to auscultation bilaterally, No rales,  wheezing, rhonchi or rubs. Abd: Soft,  nondistended, nontender, no rebound pain, no organomegaly, BS present Ext: There is tenderness over left knee joint. There is mild swelling over left knee joint, but no redness or warmth over left knee joint. There is motion limitation due to pain. 2+DP/PT pulse bilaterally Musculoskeletal: No joint deformities, erythema, or stiffness, ROM full and nontender Skin: no rashes. No skin bruise. Neuro: Alert and oriented X3, cranial nerves II-XII grossly intact, muscle strength 5/5 in all extremeties,  sensation to light touch intact.  Psych: patient is not psychotic, no suicidal or hemocidal ideation.    Assessment & Plan:

## 2012-11-13 NOTE — Assessment & Plan Note (Signed)
Patient's left knee joint pain is most likely due to osteoarthritis, as evidenced by x-ray on 2009. Patient has tried conservative treatment in the past without significant relief. She would like to have a referral to sports medicine. Will give her referral to sports medicine today. Will treat her with tramadol shortly to bridge her to see sports medicine. Will let her continue ibuprofen and Tylenol.

## 2012-11-13 NOTE — Assessment & Plan Note (Signed)
Her LDL was 145 on 07/26/12. She has 2 risk factors (family factor and smoking). Her LDL goal is less than 130. I discussed with the patient to start new medication, Pravastatin, but the patient would like to talk to her own doctor before making a decision.

## 2012-11-13 NOTE — Patient Instructions (Signed)
1. Please take tramadol when you have severe knee pain. Please continue to take ibuprofen and tylenol regularly 2. Please take all medications as prescribed.  3. If you have worsening of your symptoms or new symptoms arise, please call the clinic ([phone]), or go to the ER immediately if symptoms are severe.  Hypercholesterolemia High Blood Cholesterol Cholesterol is a white, waxy, fat-like protein needed by your body in small amounts. The liver makes all the cholesterol you need. It is carried from the liver by the blood through the blood vessels. Deposits (plaque) may build up on blood vessel walls. This makes the arteries narrower and stiffer. Plaque increases the risk for heart attack and stroke. You cannot feel your cholesterol level even if it is very high. The only way to know is by a blood test to check your lipid (fats) levels. Once you know your cholesterol levels, you should keep a record of the test results. Work with your caregiver to to keep your levels in the desired range. WHAT THE RESULTS MEAN:  Total cholesterol is a rough measure of all the cholesterol in your blood.  LDL is the so-called bad cholesterol. This is the type that deposits cholesterol in the walls of the arteries. You want this level to be low.  HDL is the good cholesterol because it cleans the arteries and carries the LDL away. You want this level to be high.  Triglycerides are fat that the body can either burn for energy or store. High levels are closely linked to heart disease. DESIRED LEVELS:  Total cholesterol below 200.  LDL below 100 for people at risk, below 70 for very high risk.  HDL above 50 is good, above 60 is best.  Triglycerides below 150. HOW TO LOWER YOUR CHOLESTEROL:  Diet.  Choose fish or white meat chicken and [place], roasted or baked. Limit fatty cuts of red meat, fried foods, and processed meats, such as sausage and lunch meat.  Eat lots of fresh fruits and vegetables. Choose  whole grains, beans, pasta, potatoes and cereals.  Use only small amounts of olive, corn or canola oils. Avoid butter, mayonnaise, shortening or palm kernel oils. Avoid foods with trans-fats.  Use skim/nonfat milk and low-fat/nonfat yogurt and cheeses. Avoid whole milk, cream, ice cream, egg yolks and cheeses. Healthy desserts include angel food cake, gingersnaps, animal crackers, hard candy, popsicles, and low-fat/nonfat frozen yogurt. Avoid pastries, cakes, pies and cookies.  Exercise.  A regular program helps decrease LDL and raises HDL.  Helps with weight control.  Do things that increase your activity level like gardening, walking, or taking the stairs.  Medication.  May be prescribed by your caregiver to help lowering cholesterol and the risk for heart disease.  You may need medicine even if your levels are normal if you have several risk factors. HOME CARE INSTRUCTIONS   Follow your diet and exercise programs as suggested by your caregiver.  Take medications as directed.  Have blood work done when your caregiver feels it is necessary. MAKE SURE YOU:   Understand these instructions.  Will watch your condition.  Will get help right away if you are not doing well or get worse. Document Released: 10/10/2005 Document Revised: 01/02/2012 Document Reviewed: 03/28/2007 [facility] Patient Information 2013 [place], [place].

## 2012-11-19 ENCOUNTER — Ambulatory Visit (HOSPITAL_COMMUNITY)
Admission: RE | Admit: 2012-11-19 | Discharge: 2012-11-19 | Disposition: A | Discharge status: Home / Self Care | Payer: No Typology Code available for payment source | Source: Ambulatory Visit | Attending: Sports Medicine | Admitting: Sports Medicine

## 2012-11-19 ENCOUNTER — Ambulatory Visit (INDEPENDENT_AMBULATORY_CARE_PROVIDER_SITE_OTHER): Payer: No Typology Code available for payment source | Admitting: Sports Medicine

## 2012-11-19 VITALS — BP 124/81 | Ht 70.0 in | Wt 210.0 lb

## 2012-11-19 DIAGNOSIS — M179 Osteoarthritis of knee, unspecified: Secondary | ICD-10-CM | POA: Insufficient documentation

## 2012-11-19 DIAGNOSIS — M25462 Effusion, left knee: Secondary | ICD-10-CM

## 2012-11-19 DIAGNOSIS — M25469 Effusion, unspecified knee: Secondary | ICD-10-CM

## 2012-11-19 DIAGNOSIS — M25569 Pain in unspecified knee: Secondary | ICD-10-CM

## 2012-11-19 DIAGNOSIS — M1710 Unilateral primary osteoarthritis, unspecified knee: Secondary | ICD-10-CM | POA: Insufficient documentation

## 2012-11-19 MED ORDER — MELOXICAM 15 MG PO TABS: 15.0000 mg | ORAL_TABLET | Freq: Every day | ORAL | Status: DC | PRN

## 2012-11-19 NOTE — Progress Notes (Signed)
  Subjective:    Patient ID: [name], female    DOB: 1961/02/21, 52 y.o.   MRN: [id number]  HPI chief complaint: Left knee pain and swelling  52 year old female comes in today at the request of her primary care physician for evaluation of her left knee. She denies any trauma to the knee but instead notes a several week period of increasing pain and swelling. He has a history of knee DJD. X-rays of the left knee in 2009 showed some moderate tricompartmental degenerative changes. X-rays of the right knee showed moderately severe DJD. Her main complaint today is pressure from the fluid that building in her knee. It makes it difficult for her to walk or stand for long periods of time. She has been treated previously at [facility] orthopedics for the same problem. She has undergone a previous aspiration and injection with good results. Per her report, they did test for gout which was negative. She denies any fevers or chills.  Medical history is reviewed. Current medications are also reviewed. She is currently taking Ultram for her left knee problem. Other medications include aspirin, 81 mg daily, and albuterol when necessary. No known drug allergies Socially, she smokes 3 packs per day, drinks alcohol twice a week    Review of Systems     Objective:   Physical Exam Well-developed, well-nourished. No acute distress. Awake alert and oriented x3  Left knee: Range of motion 0-100. 2+ intra-articular effusion. Joint is not warm to touch, no erythema. Mild tenderness to palpation along the medial and lateral joint lines. Knee is stable to ligamentous exam. Neurovascular intact distally. Walking with a noticeable limp.  Right knee: Full range of motion. No effusion. There is bony hypertrophy consistent with DJD. No tenderness to palpation. Good joint stability ligamentous exam. Neurovascular intact distally.       Assessment & Plan:  1. Left knee pain and swelling secondary to DJD  Patient's  left knee was aspirated and injected under sterile technique after risks and benefits were explained. A total of 38 cc was aspirated. We will send the fluid off for crystal analysis to rule out gout. I will also order updated x-rays of her knees to evaluate degree of osteoarthritis present. I will call her after I reviewed those studies. Of note, the patient noticed immediate improvement in her symptoms after aspiration and injection. She will start Mobic 15 mg a day with food when necessary and I've given her an Ace wrap to use on her left knee when standing.  Consent obtained and verified. Time-out conducted. Noted no overlying erythema, induration, or other signs of local infection. Skin prepped in a sterile fashion. Topical analgesic spray: Ethyl chloride. Joint: left knee Needle: 18g Completed without difficulty. Meds: 3cc marcaine, 1 cc depomedrol  Advised to call if fevers/chills, erythema, induration, drainage, or persistent bleeding.

## 2012-11-19 NOTE — Addendum Note (Signed)
Addended by: Herminio Heads on: 11/19/2012 04:59 PM   Modules accepted: Orders

## 2012-11-20 LAB — SYNOVIAL FLUID, CRYSTAL: Crystals, Fluid: NONE SEEN

## 2012-11-26 ENCOUNTER — Telehealth: Payer: Self-pay | Admitting: Sports Medicine

## 2012-11-26 NOTE — Telephone Encounter (Signed)
I spoke with the patient today on the phone regarding x-rays of her knees. She has moderate DJD in each knee. Synovial fluid analysis showed no evidence of gout or CPPD. Her knee is feeling much better after recent aspiration and injection. At this point we will just simply see how things progress she will followup if her symptoms once again worsen.

## 2012-12-04 ENCOUNTER — Ambulatory Visit: Payer: No Typology Code available for payment source

## 2012-12-28 ENCOUNTER — Ambulatory Visit: Payer: Self-pay

## 2013-01-18 ENCOUNTER — Ambulatory Visit: Payer: Self-pay

## 2013-01-21 ENCOUNTER — Ambulatory Visit: Payer: Self-pay

## 2013-02-12 ENCOUNTER — Telehealth: Payer: Self-pay | Admitting: *Deleted

## 2013-02-12 NOTE — Telephone Encounter (Signed)
I called [name] and confirmed the history as documented below.  She states she has been using symptomatic therapy and is planning to wait one more day to see if her symptoms improve.  What she described is more consistent with a viral URI and I agree with her waiting one more day and continuing the symptomatic therapy.  If she does not improve, or if she worsens, she knows to go immediately to Urgent Care for evaluation.  She is already scheduled to be seen in the clinic on May 2nd for follow-up.

## 2013-02-12 NOTE — Telephone Encounter (Signed)
Pt calls and states she thinks she has URI, denies fever but states she has muscle aches and congestion, she is advised to go to urg care if she feels she needs to be eval, a f/u appt is made at her request, she is agreeable.

## 2013-02-22 ENCOUNTER — Ambulatory Visit: Payer: No Typology Code available for payment source | Admitting: Internal Medicine

## 2013-04-24 ENCOUNTER — Ambulatory Visit (INDEPENDENT_AMBULATORY_CARE_PROVIDER_SITE_OTHER): Payer: No Typology Code available for payment source | Admitting: Sports Medicine

## 2013-04-24 VITALS — BP 130/82 | Ht 70.0 in | Wt 200.0 lb

## 2013-04-24 DIAGNOSIS — M25569 Pain in unspecified knee: Secondary | ICD-10-CM

## 2013-04-24 DIAGNOSIS — M25462 Effusion, left knee: Secondary | ICD-10-CM

## 2013-04-24 DIAGNOSIS — M25562 Pain in left knee: Secondary | ICD-10-CM

## 2013-04-24 DIAGNOSIS — M25469 Effusion, unspecified knee: Secondary | ICD-10-CM

## 2013-04-24 MED ORDER — METHYLPREDNISOLONE ACETATE 40 MG/ML IJ SUSP
40.0000 mg | Freq: Once | INTRAMUSCULAR | Status: AC
  Administered 2013-04-24: 40 mg via INTRA_ARTICULAR

## 2013-04-24 NOTE — Progress Notes (Signed)
  Subjective:    Patient ID: [name], female    DOB: Mar 19, 1961, 52 y.o.   MRN: [id number]  HPI Patient comes in today with returning left knee pain and swelling. She was last seen in the office back in January where 38cc of straw-colored fluid were aspirated from her left knee. Fluid analysis showed no evidence of CPPD or gout. X-rays showed some mild degenerative changes. She began to have returning pain and swelling back in March. She has simply been dealing with it. 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 Her symptoms are worse with prolonged standing and will resolve with rest, particularly over the weekend. She denies any fevers or chills. No recent trauma. She has tried both over-the-counter and prescription strength anti-inflammatories. She has tried both heat and ice. She has been using an Ace wrap for compression.She denies mechanical symptoms in the knee. She denies swelling in other joints.    Review of Systems     Objective:   Physical Exam Well-developed, well-nourished. No acute distress. Awake alert and oriented x3. Vital signs are reviewed.  Left knee: Range of motion 0-110. 2+ effusion. Joint is slightly warm to touch but nontender. No erythema. Good ligamentous stability. Negative McMurray's. Neurovascularly intact distally. Walking with a limp.       Assessment & Plan:  1. Returning left knee pain and swelling of unknown etiology  Patient's left knee was aspirated and injected today. 40 cc of straw-colored fluid were aspirated. Prior to the procedure consent was obtained. Area was anesthetized with 5 cc of 1% Xylocaine. 18-gauge needle and 60 cc syringe were used to aspirate 40 cc of fluid. This was followed by a 2:6 Depo-Medrol/Xylocaine injection. She tolerated the procedure without difficulty. We need to evaluate this further with an MRI of the left knee and I will order a CBC, sedimentation rate, C-reactive  protein, ANA, and rheumatoid factor. Patient will remain out of work the remainder of this week. Continue with Ace wrap for compression. I will followup with her via telephone once I reviewed all of her studies.  Consent obtained and verified. Time-out conducted. Noted no overlying erythema, induration, or other signs of local infection. Skin prepped in a sterile fashion. Topical analgesic spray: Ethyl chloride. Joint: left knee Needle: 18g 1 1/2 inch Completed without difficulty. Meds: 80mg  (2cc) depomedrol, 6cc 1% xylocaine  Advised to call if fevers/chills, erythema, induration, drainage, or persistent bleeding.

## 2013-04-24 NOTE — Addendum Note (Signed)
Addended by: Jacki Cones C on: 04/24/2013 12:17 PM   Modules accepted: Orders

## 2013-04-24 NOTE — Patient Instructions (Addendum)
You have been scheduled for a MRI at [facility] on 04/25/13 at 12:45pm.  Please check in at radiology on the 1st floor.

## 2013-04-25 ENCOUNTER — Ambulatory Visit (HOSPITAL_COMMUNITY)
Admission: RE | Admit: 2013-04-25 | Discharge: 2013-04-25 | Disposition: A | Discharge status: Home / Self Care | Payer: No Typology Code available for payment source | Source: Ambulatory Visit | Attending: Sports Medicine | Admitting: Sports Medicine

## 2013-04-25 DIAGNOSIS — M25562 Pain in left knee: Secondary | ICD-10-CM

## 2013-04-25 DIAGNOSIS — M2390 Unspecified internal derangement of unspecified knee: Secondary | ICD-10-CM | POA: Insufficient documentation

## 2013-04-25 DIAGNOSIS — M25469 Effusion, unspecified knee: Secondary | ICD-10-CM | POA: Insufficient documentation

## 2013-04-25 DIAGNOSIS — M6750 Plica syndrome, unspecified knee: Secondary | ICD-10-CM | POA: Insufficient documentation

## 2013-04-25 DIAGNOSIS — M25569 Pain in unspecified knee: Secondary | ICD-10-CM | POA: Insufficient documentation

## 2013-04-25 DIAGNOSIS — M2340 Loose body in knee, unspecified knee: Secondary | ICD-10-CM | POA: Insufficient documentation

## 2013-04-25 DIAGNOSIS — M23329 Other meniscus derangements, posterior horn of medial meniscus, unspecified knee: Secondary | ICD-10-CM | POA: Insufficient documentation

## 2013-05-01 ENCOUNTER — Ambulatory Visit (INDEPENDENT_AMBULATORY_CARE_PROVIDER_SITE_OTHER): Payer: No Typology Code available for payment source | Admitting: Internal Medicine

## 2013-05-01 ENCOUNTER — Encounter: Payer: Self-pay | Admitting: Internal Medicine

## 2013-05-01 VITALS — BP 111/73 | HR 97 | Temp 97.8°F | Ht 70.0 in | Wt 226.6 lb

## 2013-05-01 DIAGNOSIS — R21 Rash and other nonspecific skin eruption: Secondary | ICD-10-CM

## 2013-05-01 DIAGNOSIS — E785 Hyperlipidemia, unspecified: Secondary | ICD-10-CM

## 2013-05-01 DIAGNOSIS — Z0000 Encounter for general adult medical examination without abnormal findings: Secondary | ICD-10-CM

## 2013-05-01 DIAGNOSIS — D72829 Elevated white blood cell count, unspecified: Secondary | ICD-10-CM

## 2013-05-01 DIAGNOSIS — Z1239 Encounter for other screening for malignant neoplasm of breast: Secondary | ICD-10-CM

## 2013-05-01 DIAGNOSIS — M1712 Unilateral primary osteoarthritis, left knee: Secondary | ICD-10-CM

## 2013-05-01 DIAGNOSIS — M1710 Unilateral primary osteoarthritis, unspecified knee: Secondary | ICD-10-CM

## 2013-05-01 LAB — CBC
HCT: 40 % (ref 36.0–46.0)
Hemoglobin: 13.4 g/dL (ref 12.0–15.0)
MCH: 28.5 pg (ref 26.0–34.0)
MCV: 85.1 fL (ref 78.0–100.0)
RBC: 4.7 MIL/uL (ref 3.87–5.11)

## 2013-05-01 LAB — COMPREHENSIVE METABOLIC PANEL
BUN: 13 mg/dL (ref 6–23)
CO2: 26 mEq/L (ref 19–32)
Calcium: 9.2 mg/dL (ref 8.4–10.5)
Creat: 0.73 mg/dL (ref 0.50–1.10)
Glucose, Bld: 91 mg/dL (ref 70–99)
Total Bilirubin: 0.4 mg/dL (ref 0.3–1.2)

## 2013-05-01 NOTE — Assessment & Plan Note (Signed)
Mildly irritated skin on left chest/neck.  Unclear if this has not been due to the rubbing alcohol. I have advised that she use baby powder to keep the area cool and dry, and use benadryl anti-itch cream as needed.  If rash does not improve or worsens, she should return for re-evaluation.

## 2013-05-01 NOTE — Assessment & Plan Note (Signed)
Patient with 2 risk factors (family history, smoking), but her 5 year risk is 5%, so her LDL goal for initiating stating would be at 160.  At this point, we discussed the importance of lifestyle modifications.  She admits to diet high in fried foods, which we discussed should be decreased.

## 2013-05-01 NOTE — Progress Notes (Signed)
Subjective:   Patient ID: [name] female   DOB: 08-12-1961 52 y.o.   MRN: [id number]  HPI: [name] is a 52 y.o. woman with history of OA and GERD who presents today for routine follow up.    Her only complaint is a pruritic rash on her left chest/neck that she noticed about 4weeks ago, for which she has tried to use rubbing alcohol for.  No new detergents/perfume/jewelry.  Isolated to only that area.  Not painful.  Worse with heat/when sweating.    She also asks that I review her knee MRI results with her.   She agrees to have her mammogram scheduled.  Review of Systems: Constitutional: Denies fever, chills, diaphoresis, appetite change and fatigue.  Respiratory: Denies SOB, DOE, cough, chest tightness, and wheezing.  Cardiovascular: Denies chest pain, palpitations and leg swelling.  Gastrointestinal: Denies nausea, vomiting, abdominal pain, diarrhea, constipation,blood in stool and abdominal distention.  Genitourinary: Denies dysuria, urgency, frequency, hematuria, flank pain and difficulty urinating.   Skin: Denies pallor, rash and wound.  Neurological: Denies dizziness, syncope, weakness, lightheadedness, numbness and headaches.    Past Medical History  Diagnosis Date  . GERD (gastroesophageal reflux disease)   . Osteoarthritis     b/l hip, seen on CT abd in 05/2012  . Hiatal hernia     CT abdomen in August 2013  . Allergy     uses Albuterol inhaler during allergy season  . Diverticulosis 2013    Colonoscopy  . Hx of colonic polyps 2013    Colonoscopy    Current Outpatient Prescriptions  Medication Sig Dispense Refill  . albuterol (PROVENTIL HFA;VENTOLIN HFA) 108 (90 BASE) MCG/ACT inhaler Inhale 2 puffs into the lungs every 6 (six) hours as needed. For shortness of breath      . aspirin 81 MG chewable tablet Chew 81 mg by mouth daily.      . meloxicam (MOBIC) 15 MG tablet Take 1 tablet (15 mg total) by mouth daily as needed for pain. Take with food.  40  tablet  0  . pantoprazole (PROTONIX) 40 MG tablet Take 1 tablet (40 mg total) by mouth daily.  30 tablet  3   No current facility-administered medications for this visit.   Family History  Problem Relation Age of Onset  . Hypertension Mother   . Hyperlipidemia Mother   . Stroke Father     passed away at 39 yo of cerebal brain hemorrhage   . Colon cancer Neg Hx   . Stomach cancer Neg Hx   . Colon polyps Neg Hx   . Rectal cancer Neg Hx    History   Social History  . Marital Status: Single    Spouse Name: N/A    Number of Children: 3  . Years of Education: 12th   Occupational History  . [employer]. [employer]     layed off   Social History Main Topics  . Smoking status: Current Some Day Smoker -- 0.50 packs/day for 31 years    Types: Cigarettes  . Smokeless tobacco: Never Used  . Alcohol Use: 1.2 oz/week    2 Glasses of wine per week     Comment: 1-2 beers/week  . Drug Use: No  . Sexually Active: Yes -- Female partner(s)     Comment: Same partner for 11 years   Other Topics Concern  . None   Social History Narrative   Live with her mother   Has three kids - all grown -  all in [place] area   4 grandchildren   Worth at home - 11 years    Objective:  Physical Exam: Filed Vitals:   05/01/13 1615  BP: 111/73  Pulse: 97  Temp: 97.8 F (36.6 C)  TempSrc: Oral  Height: 5\' 10"  (1.778 m)  Weight: 226 lb 9.6 oz (102.785 kg)  SpO2: 97%   General: Comfortably seated in chair, appears as stated age, well nourished HEENT: PERRL, EOMI, no scleral icterus Cardiac: RRR, no rubs, murmurs or gallops Pulm: clear to auscultation bilaterally, moving normal volumes of air Abd: soft, nontender, nondistended, BS present Ext: warm and well perfused, no pedal edema; mild swelling of her left knee in comparison to the right Skin: mildly erythematous rash with few dyshidrotic papules along left neck/chest Neuro: alert and oriented X3, cranial nerves II-XII grossly  intact  Assessment & Plan:  Case and care discussed with Dr. Dalphine Handing.  Please see problem oriented charting for further details. Patient to return in 1 year for routine follow up.  Will need to discuss colonoscopy at that time.

## 2013-05-01 NOTE — Assessment & Plan Note (Signed)
We will schedule mammo today. Will need to discuss colonoscopy at next follow up

## 2013-05-01 NOTE — Patient Instructions (Addendum)
General Instructions: -Because of your high cholesterol, it is important that you start working on your diet! Less fried foods! -For your rash, try using baby powder daily to keep the area dry and cool.  You may also use benadryl anti-itch cream. -Please follow up with Dr. Margaretha Sheffield regarding your left knee.  Please be sure to bring all of your medications with you to every visit.  Should you have any new or worsening symptoms, please be sure to call the clinic at [phone].  Treatment Goals:  Goals (1 Years of Data) as of 05/01/13         07/26/12 10/15/07     Result Component    . LDL CALC < 160  145 233      Progress Toward Treatment Goals:    Self Care Goals & Plans:  Self Care Goal 05/01/2013  Manage my medications take my medicines as prescribed; refill my medications on time  Monitor my health keep track of my weight  Eat healthy foods eat foods that are low in salt; eat baked foods instead of fried foods; drink diet soda or water instead of juice or soda  Be physically active find an activity I enjoy  Stop smoking go to the [employer] ([employer]); cut down the number of cigarettes smoked    Hypercholesterolemia High Blood Cholesterol Cholesterol is a white, waxy, fat-like protein needed by your body in small amounts. The liver makes all the cholesterol you need. It is carried from the liver by the blood through the blood vessels. Deposits (plaque) may build up on blood vessel walls. This makes the arteries narrower and stiffer. Plaque increases the risk for heart attack and stroke. You cannot feel your cholesterol level even if it is very high. The only way to know is by a blood test to check your lipid (fats) levels. Once you know your cholesterol levels, you should keep a record of the test results. Work with your caregiver to to keep your levels in the desired range. WHAT THE RESULTS MEAN:  Total cholesterol is a rough measure of all the cholesterol in your  blood.  LDL is the so-called bad cholesterol. This is the type that deposits cholesterol in the walls of the arteries. You want this level to be low.  HDL is the good cholesterol because it cleans the arteries and carries the LDL away. You want this level to be high.  Triglycerides are fat that the body can either burn for energy or store. High levels are closely linked to heart disease. DESIRED LEVELS:  Total cholesterol below 200.  LDL below 100 for people at risk, below 70 for very high risk.  HDL above 50 is good, above 60 is best.  Triglycerides below 150. HOW TO LOWER YOUR CHOLESTEROL:  Diet.  Choose fish or white meat chicken and [place], roasted or baked. Limit fatty cuts of red meat, fried foods, and processed meats, such as sausage and lunch meat.  Eat lots of fresh fruits and vegetables. Choose whole grains, beans, pasta, potatoes and cereals.  Use only small amounts of olive, corn or canola oils. Avoid butter, mayonnaise, shortening or palm kernel oils. Avoid foods with trans-fats.  Use skim/nonfat milk and low-fat/nonfat yogurt and cheeses. Avoid whole milk, cream, ice cream, egg yolks and cheeses. Healthy desserts include angel food cake, gingersnaps, animal crackers, hard candy, popsicles, and low-fat/nonfat frozen yogurt. Avoid pastries, cakes, pies and cookies.  Exercise.  A regular program helps decrease LDL and raises HDL.  Helps with weight control.  Do things that increase your activity level like gardening, walking, or taking the stairs.  Medication.  May be prescribed by your caregiver to help lowering cholesterol and the risk for heart disease.  You may need medicine even if your levels are normal if you have several risk factors. HOME CARE INSTRUCTIONS   Follow your diet and exercise programs as suggested by your caregiver.  Take medications as directed.  Have blood work done when your caregiver feels it is necessary. MAKE SURE YOU:    Understand these instructions.  Will watch your condition.  Will get help right away if you are not doing well or get worse. Document Released: 10/10/2005 Document Revised: 01/02/2012 Document Reviewed: 03/28/2007 [facility] Patient Information 2014 [place], [place].

## 2013-05-02 ENCOUNTER — Telehealth: Payer: Self-pay | Admitting: Sports Medicine

## 2013-05-02 NOTE — Telephone Encounter (Signed)
Opened by accident

## 2013-05-02 NOTE — Progress Notes (Signed)
Case discussed with Dr. Sharda soon after the resident saw the patient.  We reviewed the resident's history and exam and pertinent patient test results.  I agree with the assessment, diagnosis, and plan of care documented in the resident's note. 

## 2013-05-07 ENCOUNTER — Telehealth: Payer: Self-pay | Admitting: Sports Medicine

## 2013-05-07 NOTE — Telephone Encounter (Signed)
I spoke with the patient on the phone today regarding MRI findings of her knee. She has more pronounced degenerative changes been more seen on her x-rays. She has both meniscal pathology as well as loose bodies and I think the patient would benefit greatly from knee arthroscopy for debridement and clean out but unfortunately she is without insurance.

## 2013-05-07 NOTE — Telephone Encounter (Signed)
I spoke with the patient on the phone today regarding MRI findings of her knee. She has more pronounced degenerative changes than were seen on her x-rays. She has both meniscal pathology as well as several loose bodies and I think she would benefit greatly from arthroscopy. Unfortunately however the patient is without insurance at this time. She is doing better after her recent aspiration and injection. She did not get the complete set of blood work ordered but at this point I am much less suspicious about some sort of systemic arthropathy. MRI findings certainly explain her reoccurring effusions. Since she is unable to pursue surgery I have simply asked that she keep compression on the knee in the form of an Ace wrap and utilize elevation and icing at the end of the day. I can re-aspirate her knee if needed but each subsequent re-aspiration may place her at an increased risk of infection. Followup when necessary.

## 2013-10-11 ENCOUNTER — Ambulatory Visit: Payer: Self-pay

## 2015-07-09 ENCOUNTER — Emergency Department (HOSPITAL_COMMUNITY)
Admission: EM | Admit: 2015-07-09 | Discharge: 2015-07-09 | Disposition: A | Discharge status: Home / Self Care | Payer: Self-pay | Attending: Emergency Medicine | Admitting: Emergency Medicine

## 2015-07-09 ENCOUNTER — Encounter (HOSPITAL_COMMUNITY): Payer: Self-pay | Admitting: Emergency Medicine

## 2015-07-09 DIAGNOSIS — Z86010 Personal history of colonic polyps: Secondary | ICD-10-CM | POA: Insufficient documentation

## 2015-07-09 DIAGNOSIS — Z720 Tobacco use: Secondary | ICD-10-CM | POA: Insufficient documentation

## 2015-07-09 DIAGNOSIS — Z79899 Other long term (current) drug therapy: Secondary | ICD-10-CM | POA: Insufficient documentation

## 2015-07-09 DIAGNOSIS — K219 Gastro-esophageal reflux disease without esophagitis: Secondary | ICD-10-CM | POA: Insufficient documentation

## 2015-07-09 DIAGNOSIS — K047 Periapical abscess without sinus: Secondary | ICD-10-CM

## 2015-07-09 DIAGNOSIS — Z8719 Personal history of other diseases of the digestive system: Secondary | ICD-10-CM | POA: Insufficient documentation

## 2015-07-09 DIAGNOSIS — K029 Dental caries, unspecified: Secondary | ICD-10-CM | POA: Insufficient documentation

## 2015-07-09 DIAGNOSIS — M1990 Unspecified osteoarthritis, unspecified site: Secondary | ICD-10-CM | POA: Insufficient documentation

## 2015-07-09 DIAGNOSIS — Z7982 Long term (current) use of aspirin: Secondary | ICD-10-CM | POA: Insufficient documentation

## 2015-07-09 MED ORDER — TRAMADOL HCL 50 MG PO TABS: 50.0000 mg | ORAL_TABLET | Freq: Four times a day (QID) | ORAL | Status: DC | PRN

## 2015-07-09 MED ORDER — AMOXICILLIN 500 MG PO CAPS: 500.0000 mg | ORAL_CAPSULE | Freq: Three times a day (TID) | ORAL | Status: DC

## 2015-07-09 NOTE — ED Provider Notes (Signed)
CSN: [id number]     Arrival date & time 07/09/15  2979 History  This chart was scribed for non-physician practitioner, Delos Haring, PA-C working with Evelina Bucy, MD, by Erling Conte, ED Scribe. This patient was seen in room TR07C/TR07C and the patient's care was started at 9:55 AM.     Chief Complaint  Patient presents with  . Dental Pain    The history is provided by the patient. No language interpreter was used.     [name] 54 y.o.female  PCP: No primary care provider on file.  Blood pressure 125/72, pulse 69, temperature 97.9 F (36.6 C), temperature source Oral, resp. rate 16, weight 234 lb (106.142 kg), SpO2 100 %.  SIGNIFICANT PMH: none CHIEF COMPLAINT: dental pain  When: 4 days Location: right lower dental pain. States she has had dental abscesses in the past and it feels similar Chronicity: acute Radiation: non radiating Quality and severity: severe and throbbing Alleviating factors: nothing makes it better Worsening factors: touch and eating Treatments tried: Gargling with warm water and peroxyl mouth wash with no significant relief Associated Symptoms: drainage from the tooth, mild right lower facial swelling, mild gum swelling Risk factors: current everyday smoker with 0.5 ppd for 31 years Negative ROS: fever, chills, trismus, nausea, vomiting, diarrhea, myalgias, weakness confusion, neck pain, rash, SOB, trouble swallowing, drooling, diaphoresis, headache, weakness (general or focal),       Past Medical History  Diagnosis Date  . GERD (gastroesophageal reflux disease)   . Osteoarthritis     b/l hip, seen on CT abd in 05/2012  . Hiatal hernia     CT abdomen in August 2013  . Allergy     uses Albuterol inhaler during allergy season  . Diverticulosis 2013    Colonoscopy  . Hx of colonic polyps 2013    Colonoscopy    Past Surgical History  Procedure Laterality Date  . Tonsillectomy      childhood  . Total abdominal hysterectomy w/ bilateral  salpingoophorectomy  2008    Fibroids, done by Dr. Clovia Cuff at [facility]   Family History  Problem Relation Age of Onset  . Hypertension Mother   . Hyperlipidemia Mother   . Stroke Father     passed away at 39 yo of cerebal brain hemorrhage   . Colon cancer Neg Hx   . Stomach cancer Neg Hx   . Colon polyps Neg Hx   . Rectal cancer Neg Hx    Social History  Substance Use Topics  . Smoking status: Current Some Day Smoker -- 0.50 packs/day for 31 years    Types: Cigarettes  . Smokeless tobacco: Never Used  . Alcohol Use: 1.2 oz/week    2 Glasses of wine per week     Comment: 1-2 beers/week   OB History    No data available     Review of Systems  Constitutional: Negative for fever and chills.  HENT: Positive for dental problem and facial swelling. Negative for ear pain and sore throat.   Respiratory: Negative for shortness of breath.   Gastrointestinal: Negative for nausea, vomiting and diarrhea.  Musculoskeletal: Negative for myalgias.  Neurological: Negative for weakness.  All other systems reviewed and are negative.     Allergies  Review of patient's allergies indicates no known allergies.  Home Medications   Prior to Admission medications   Medication Sig Start Date End Date Taking? Authorizing Provider  albuterol (PROVENTIL HFA;VENTOLIN HFA) 108 (90 BASE) MCG/ACT inhaler Inhale 2  puffs into the lungs every 6 (six) hours as needed. For shortness of breath    Historical Provider, MD  aspirin 81 MG chewable tablet Chew 81 mg by mouth daily.    Historical Provider, MD  meloxicam (MOBIC) 15 MG tablet Take 1 tablet (15 mg total) by mouth daily as needed for pain. Take with food. 11/19/12   Carlos Levering Draper, DO  pantoprazole (PROTONIX) 40 MG tablet Take 1 tablet (40 mg total) by mouth daily. 06/15/12 06/15/13  Othella Boyer, MD   Triage Vitals: BP 125/72 mmHg  Pulse 69  Temp(Src) 97.9 F (36.6 C) (Oral)  Resp 16  Wt 234 lb (106.142 kg)  SpO2 100%  Physical  Exam  Constitutional: She is oriented to person, place, and time. She appears well-developed and well-nourished. No distress.  HENT:  Head: Normocephalic and atraumatic.  Mouth/Throat: Uvula is midline and oropharynx is clear and moist. No trismus in the jaw. Dental abscesses (base of lower right incisor) and dental caries (widespread dental decay) present. No uvula swelling.  No tongue protrusion, no drooling  Eyes: Conjunctivae and EOM are normal.  Neck: Neck supple. No tracheal deviation present.  Cardiovascular: Normal rate.   Pulmonary/Chest: Effort normal. No respiratory distress.  Musculoskeletal: Normal range of motion.  Neurological: She is alert and oriented to person, place, and time.  Skin: Skin is warm and dry.  Psychiatric: She has a normal mood and affect. Her behavior is normal.  Nursing note and vitals reviewed.   ED Course  Procedures (including critical care time)  DIAGNOSTIC STUDIES: Oxygen Saturation is 100% on RA, normal by my interpretation.    COORDINATION OF CARE:  Labs Review Labs Reviewed - No data to display  Imaging Review No results found. I have personally reviewed and evaluated these images and lab results as part of my medical decision-making.   EKG Interpretation None      MDM   Final diagnoses:  Dental abscess    NERVE BLOCK Date/Time: 07/09/15 Performed by: Linus Mako Authorized by: Linus Mako Consent: Verbal consent obtained. Risks and benefits: risks, benefits and alternatives were discussed Consent given by: patient Indications: pain relief Body area: face/mouth Laterality: left Needle gauge: 25 G Local anesthetic: lidocaine 2% without epinephrine Anesthetic total: 2 ml Outcome: pain improved Patient tolerance: Patient tolerated the procedure well with no immediate complications. Comments: Patient had complete relief of pain.  INCISION AND DRAINAGE Performed by: Linus Mako Consent: Verbal consent  obtained. Risks and benefits: risks, benefits and alternatives were discussed Type: abscess  Body area: right incisor gumline  Anesthesia: local infiltration  Incision was made with a 21 gauge needle  Local anesthetic:dental block  Drainage: purulent  Drainage amount: 0.5 cc  Patient tolerance: Patient tolerated the procedure well with no immediate complications.  Given dental referrals and resource guide.   No emergent s/sx's present. Patent airway. No trismus.  No neck tenderness or protrusion of tongue or floor of mouth.   Medications - No data to display  54 y.o.Glennice Marcos Fenn's evaluation in the Emergency Department is complete. It has been determined that no acute conditions requiring further emergency intervention are present at this time. The patient/guardian have been advised of the diagnosis and plan. We have discussed signs and symptoms that warrant return to the ED, such as changes or worsening in symptoms.  Vital signs are stable at discharge. Filed Vitals:   07/09/15 0848  BP: 125/72  Pulse: 69  Temp: 97.9 F (36.6 C)  Resp: 73    Patient/guardian has voiced understanding and agreed to follow-up with the PCP or specialist.     I personally performed the services described in this documentation, which was scribed in my presence. The recorded information has been reviewed and is accurate.     Delos Haring, PA-C 07/09/15 [place], PA-C 07/09/15 1018  Evelina Bucy, MD 07/09/15 1300

## 2015-07-09 NOTE — ED Notes (Signed)
Patient states right lower dental abscess.   Patient states came up x 2 days ago and is getting larger.  Denies other symptoms.

## 2015-07-09 NOTE — Discharge Instructions (Signed)
Dental Abscess A dental abscess is a collection of infected fluid (pus) from a bacterial infection in the inner part of the tooth (pulp). It usually occurs at the end of the tooth's root.  CAUSES   Severe tooth decay.  Trauma to the tooth that allows bacteria to enter into the pulp, such as a broken or chipped tooth. SYMPTOMS   Severe pain in and around the infected tooth.  Swelling and redness around the abscessed tooth or in the mouth or face.  Tenderness.  Pus drainage.  Bad breath.  Bitter taste in the mouth.  Difficulty swallowing.  Difficulty opening the mouth.  Nausea.  Vomiting.  Chills.  Swollen neck glands. DIAGNOSIS   A medical and dental history will be taken.  An examination will be performed by tapping on the abscessed tooth.  X-rays may be taken of the tooth to identify the abscess. TREATMENT The goal of treatment is to eliminate the infection. You may be prescribed antibiotic medicine to stop the infection from spreading. A root canal may be performed to save the tooth. If the tooth cannot be saved, it may be pulled (extracted) and the abscess may be drained.  HOME CARE INSTRUCTIONS  Only take over-the-counter or prescription medicines for pain, fever, or discomfort as directed by your caregiver.  Rinse your mouth (gargle) often with salt water ( tsp salt in 8 oz [250 ml] of warm water) to relieve pain or swelling.  Do not drive after taking pain medicine (narcotics).  Do not apply heat to the outside of your face.  Return to your dentist for further treatment as directed. SEEK MEDICAL CARE IF:  Your pain is not helped by medicine.  Your pain is getting worse instead of better. SEEK IMMEDIATE MEDICAL CARE IF:  You have a fever or persistent symptoms for more than 2-3 days.  You have a fever and your symptoms suddenly get worse.  You have chills or a very bad headache.  You have problems breathing or swallowing.  You have trouble  opening your mouth.  You have swelling in the neck or around the eye. Document Released: 10/10/2005 Document Revised: 07/04/2012 Document Reviewed: 01/18/2011 [facility] Patient Information 2015 [place], [place]. This information is not intended to replace advice given to you by your health care provider. Make sure you discuss any questions you have with your health care provider.   RESOURCE GUIDE  Chronic Pain Problems: Contact [facility]  [phone] Patients need to be referred by their primary care doctor.  Insufficient Money for Medicine: Contact United Way:  call "211" or [place] [phone].  No Primary Care Doctor: Call Health Connect  [phone] - can help you locate a primary care doctor that  accepts your insurance, provides certain services, etc. Physician Referral [phone]  Agencies that provide inexpensive medical care: [facility]  [facility]  [phone] [facility]  [phone] [facility]  [phone] Planned [facility]  [phone] [facility]  [phone]  [place] Providers: [facility]- [address], [address]  [phone], Mon-Fri 9am-7pm, Sat 9am-1pm [place], [address] [place]  [place], [address] [place]  [place]- [address]  [place], [address], [phone]  Only accepts [place] Access [place] patients after they have their name  applied to their  card  Self Pay (no insurance) in [facility]: Sickle Cell Patients: Dr Kevan Ny, [facility]  [place], [facility]- [place]  [facility] [place]- [address], [facility]- see  information above (Speak to [employer] if you do not have insurance)       -  [facility]- [place], [place],  [place], [place]  Dr Vista Lawman-  [address], [address], [place], [place]- [address] [phone]       -  [facility] [place]- [address], [place], also [address] [phone]       -    [facility]- [address], [place], 1st & 3rd Saturday   every month, 10am-1pm  1) Find a Doctor and Pay Out of Pocket Although you won't have to find out who is covered by your insurance plan, it is a good idea to ask around and get recommendations. You will then need to call the office and see if the doctor you have chosen will accept you as a new patient and what types of options they offer for patients who are self-pay. Some doctors offer discounts or will set up payment plans for their patients who do not have insurance, but you will need to ask so you aren't surprised when you get to your appointment.  2) Contact Your Local Health Department Not all health departments have doctors that can see patients for sick visits, but many do, so it is worth a call to see if yours does. If you don't know where your local health department is, you can check in your phone book. The CDC also has a tool to help you locate your state's health department, and many state websites also have listings of all of their local health departments.  3) Find a [facility] If your illness is not likely to be very severe or complicated, you may want to try a walk in clinic. These are popping up all over the country in pharmacies, drugstores, and shopping centers. They're usually staffed by nurse practitioners or physician assistants that have been trained to treat common illnesses and complaints. They're usually fairly  quick and inexpensive. However, if you have serious medical issues or chronic medical problems, these are probably not your best option  [facility] [place], [facility], [address] [place], phone [phone] or [phone].  Monday - Friday, call for an appointment. [place], [facility], [address], [place], phone [phone] or [phone].  Monday - Friday, call for an appointment.  Abuse/Neglect: [place] [phone] [place] [phone] (After Hours)  Emergency Shelter:  [facility] [phone]  Maternity Homes: Room at the [place] [phone] [place] [phone]  MRSA Hotline #:   (605)414-3535  [facility]  of [place] Dept. [address]         [place] Phone:  [phone]                                  Phone:  [phone]                   Phone:  [phone]  [facility], [place]- [phone]       -     [facility] in [place], [address],                                  [place] [phone] or [phone] (After Hours)   [place]  Substance Abuse Resources: Alcohol and Drug Services  [phone] [place] [phone] [facility] [place] Chinita [place] [phone] Residential & Outpatient Substance Abuse Program  [phone]  Psychological Services: [place]  [phone] [place]   [place], [place]. [address] [place], [place]: [phone] or [phone], [email or web address]  Dental Assistance  If unable to pay or uninsured, contact:  [facility] or [facility]. to become qualified for the adult dental clinic.  Patients with Medicaid: [facility] [phone] [address] [place], [place] [address], [phone]  If unable to pay, or uninsured, contact [facility] [phone]) or [place] [phone] in [place], [place] in [facility]) to become qualified for the adult dental clinic  Other [place]- [place], [place], [place], [address], [place], [place], 2nd and 4th Thursday of the month at 6:30am.  10 clients each day by appointment, can sometimes see walk-in patients if someone does not show for an appointment. [facility]- [address] [place] Danker [place], [place], [address], [place], [place], [place], [address], [place] [facility] [phone]  Please make every effort to establish with a primary care physician for routine medical care  [place]  The [place] provides a wide range of adult health services. Some of these services are designed to address the healthcare needs of all [facility] residents and all services are designed to meet the needs of uninsured/underinsured low income residents. Some services are available to any resident of [place], call [phone] for details. ] The [facility], a new medical clinic for adults, is now open. For more information about the Center and its services please call [phone]. For  information on our  [place] services, click here.  For more information on any of the following Department of Public Health programs, including hours of service, click on the highlighted link.  SERVICES FOR WOMEN (Adults and Teens) [employer] provide a full range of birth control options plus education and counseling. New patient visit and annual return visits include a complete examination, pap test as indicated, and other laboratory as indicated. Included is our [employer] for men.  Maternity Care is provided through pregnancy, including a six week post partum exam. Women who meet eligibility criteria for the Medicaid for Pregnant Women program, receive care free. Other women are charged on a sliding scale according to income. Note: [facility] provides services to pregnant women who have a Medicaid card. Call [phone] for an appointment in [place] or [phone] for an appointment in [facility].  Primary Care for Medicaid [place] Access Women is available through the [place]. As primary care provider for the [place] program, women may designate the [facility] as their primary care provider.  PLEASE CALL [phone] FOR AN APPOINTMENT FOR THE ABOVE SERVICES IN EITHER [place] OR [place]. Information available in [place] and [place].   Childbirth Education Classes are open to the public and offered to help families prepare for the best possible childbirth experience as well as to promote lifelong health and wellness. Classes are offered throughout the year and meet on the same night once a week for five weeks. Medicaid covers the cost of the classes for the mother-to-be and her partner. For participants without Medicaid, the cost of the class series is $45.00 for the mother-to-be and her partner. Class size is limited and registration is required. For more information or to  register call [phone]. Baby items donated by [employer] and the [employer] [place] are given away during each class series.  SERVICES FOR WOMEN AND MEN Sexually Transmitted Infection appointments, including HIV testing, are available daily (weekdays, except holidays). Call early as same-day appointments are limited. For an appointment in either [facility] or [place], call [phone]. Services are confidential and free of charge.  Skin Testing for Tuberculosis Please call [phone]. Adult Immunizations are available, usually for a fee. Please call [phone] for details.  PLEASE CALL [phone] FOR AN APPOINTMENT FOR THE ABOVE SERVICES IN EITHER [place] OR [place].   [facility] provides up to the minute recommended vaccines for your travel destination. We also provide essential health and political information to help insure a safe and pleasurable travel experience. This program is self-sustaining, however, fees are very competitive. We are a CERTIFIED YELLOW FEVER IMMUNIZATION approved clinic site. PLEASE CALL [phone] FOR AN APPOINTMENT IN EITHER  OR [place].   If you have questions about the services listed above, we want to answer them! Email [place] at: [email or web address] Home Visiting Services for elderly and the disabled are available to residents of [facility] who are in need of care that compares to the care offered by a nursing home, have needs that can be met by the program, and have [place] Medicaid. Other short term services are available to residents 18 years and older who are unable to meet requirements for eligibility to receive services from a certified home health agency, spend the majority of time at home, and need care for six months or less.  PLEASE CALL H548482 OR [phone] FOR MORE INFORMATION. Medication Assistance Program  serves as a link between [employer] and patients to provide low cost or free  prescription medications. This servce is available for residents who meet certain income restrictions and have no insurance coverage.  PLEASE CALL [phone] ([place]) OR [phone] ([place]) FOR MORE INFORMATION.  Updated Feb. 21, 2013

## 2015-07-09 NOTE — ED Notes (Signed)
Dental box placed at bedside

## 2015-07-13 ENCOUNTER — Inpatient Hospital Stay (HOSPITAL_COMMUNITY)
Admission: AD | Admit: 2015-07-13 | Discharge: 2015-07-14 | Disposition: A | Discharge status: Home / Self Care | Payer: Self-pay | Source: Ambulatory Visit | Attending: Family Medicine | Admitting: Family Medicine

## 2015-07-13 ENCOUNTER — Encounter (HOSPITAL_COMMUNITY): Payer: Self-pay | Admitting: *Deleted

## 2015-07-13 DIAGNOSIS — Z90710 Acquired absence of both cervix and uterus: Secondary | ICD-10-CM | POA: Insufficient documentation

## 2015-07-13 DIAGNOSIS — R339 Retention of urine, unspecified: Secondary | ICD-10-CM | POA: Insufficient documentation

## 2015-07-13 DIAGNOSIS — F17200 Nicotine dependence, unspecified, uncomplicated: Secondary | ICD-10-CM | POA: Insufficient documentation

## 2015-07-13 NOTE — MAU Provider Note (Signed)
History     CSN: [id number]  Arrival date and time: 07/13/15 2212   First Provider Initiated Contact with Patient 07/13/15 2336      No chief complaint on file.  HPI Comments: Urinary retention. Patient states that she has only been able to dribble out small amounts of urine x 4 days. This is a new problem. Episode onset: x 4 days. The problem has been unchanged. The pain is at a severity of 8/10. The pain is mild. There has been no fever. There is no history of pyelonephritis. She has tried nothing for the symptoms. Her past medical history is significant for catheterization and a urological procedure. hysterectomy with bladder procedure in 2008.     Past Medical History  Diagnosis Date  . GERD (gastroesophageal reflux disease)   . Osteoarthritis     b/l hip, seen on CT abd in 05/2012  . Hiatal hernia     CT abdomen in August 2013  . Allergy     uses Albuterol inhaler during allergy season  . Diverticulosis 2013    Colonoscopy  . Hx of colonic polyps 2013    Colonoscopy     Past Surgical History  Procedure Laterality Date  . Tonsillectomy      childhood  . Total abdominal hysterectomy w/ bilateral salpingoophorectomy  2008    Fibroids, done by [facility] Cl[facility]ia Cuff at [facility]    Family History  Problem Relation Age of Onset  . Hypertension Mother   . Hyperlipidemia Mother   . Stroke Father     passed away at 15 yo of cerebal brain hemorrhage   . Colon cancer Neg Hx   . Stomach cancer Neg Hx   . Colon polyps Neg Hx   . Rectal cancer Neg Hx     Social History  Substance Use Topics  . Smoking status: Current Some Day Smoker -- 0.50 packs/day for 31 years    Types: Cigarettes  . Smokeless tobacco: Never Used  . Alcohol Use: 1.2 oz/week    2 Glasses of wine per week     Comment: 1-2 beers/week    Allergies: No Known Allergies  Prescriptions prior to admission  Medication Sig Dispense Refill Last Dose  . albuterol (PROVENTIL HFA;VENTOLIN HFA) 108 (90 BASE)  MCG/ACT inhaler Inhale 2 puffs into the lungs every 6 (six) hours as needed. For shortness of breath   Taking  . amoxicillin (AMOXIL) 500 MG capsule Take 1 capsule (500 mg total) by mouth 3 (three) times daily. 21 capsule 0   . aspirin 81 MG chewable tablet Chew 81 mg by mouth daily.   Taking  . meloxicam (MOBIC) 15 MG tablet Take 1 tablet (15 mg total) by mouth daily as needed for pain. Take with food. 40 tablet 0 Taking  . pantoprazole (PROTONIX) 40 MG tablet Take 1 tablet (40 mg total) by mouth daily. 30 tablet 3 Taking  . traMADol (ULTRAM) 50 MG tablet Take 1 tablet (50 mg total) by mouth every 6 (six) hours as needed. 15 tablet 0     Review of Systems  Constitutional: Negative for fever.  Gastrointestinal: Positive for abdominal pain. Negative for nausea, vomiting, diarrhea and constipation.  Genitourinary: Negative for flank pain.   Physical Exam   Blood pressure 119/80, pulse 68, temperature 98 F (36.7 C), temperature source Oral, resp. rate 18, height 5\' 10"  (1.778 m), weight 109.135 kg (240 lb 9.6 oz).  Physical Exam  Nursing note and vitals reviewed. Constitutional: She is oriented  to person, place, and time. She appears well-developed and well-nourished. No distress.  HENT:  Head: Normocephalic.  Cardiovascular: Normal rate.   Respiratory: Effort normal.  GI: Soft. There is no tenderness. There is no rebound.  Genitourinary:   No cystocele    Neurological: She is alert and oriented to person, place, and time.  Skin: Skin is warm and dry.  Psychiatric: She has a normal mood and affect.   Results for orders placed or performed during the hospital encounter of 07/13/15 (from the past 24 hour(s))  Urinalysis, Routine w reflex microscopic (not at [facility])     Status: None   Collection Time: 07/13/15 11:51 PM  Result Value Ref Range   Color, Urine YELLOW YELLOW   APPearance CLEAR CLEAR   Specific Gravity, Urine 1.025 1.005 - 1.030   pH 5.5 5.0 - 8.0   Glucose, UA NEGATIVE  NEGATIVE mg/dL   Hgb urine dipstick NEGATIVE NEGATIVE   Bilirubin Urine NEGATIVE NEGATIVE   Ketones, ur NEGATIVE NEGATIVE mg/dL   Protein, ur NEGATIVE NEGATIVE mg/dL   Urobilinogen, UA 0.2 0.0 - 1.0 mg/dL   Nitrite NEGATIVE NEGATIVE   Leukocytes, UA NEGATIVE NEGATIVE    MAU Course  Procedures  MDM 2354: Patient catheterized for about 400 cc of clear yellow urine Bladder scanner shows bladder is empty.  2358: D/W Dr. Nehemiah Settle, will FU in GYN clinic  Assessment and Plan   1. Urinary retention    DC home RX: none  Return to MAU as needed FU with OB as planned  Follow-up Information    [facility] with [facility].   Specialty:  Obstetrics and Gynecology   Why:  Wednesday 07/15/15 at 3:30    Contact information:   [place] [phone]       Mathis Bud 07/13/2015, 11:37 PM

## 2015-07-13 NOTE — MAU Note (Signed)
Pt presents stating she is having trouble urinating. Has not been able to empty bladder in 3 days. States this happened 2 years ago and she came to [place]. Did a cath and she was able to urinate again.

## 2015-07-14 DIAGNOSIS — R339 Retention of urine, unspecified: Secondary | ICD-10-CM

## 2015-07-14 LAB — URINALYSIS, ROUTINE W REFLEX MICROSCOPIC
Bilirubin Urine: NEGATIVE
Glucose, UA: NEGATIVE mg/dL
Hgb urine dipstick: NEGATIVE
KETONES UR: NEGATIVE mg/dL
LEUKOCYTES UA: NEGATIVE
NITRITE: NEGATIVE
PROTEIN: NEGATIVE mg/dL
Specific Gravity, Urine: 1.025 (ref 1.005–1.030)
Urobilinogen, UA: 0.2 mg/dL (ref 0.0–1.0)
pH: 5.5 (ref 5.0–8.0)

## 2015-07-14 NOTE — Discharge Instructions (Signed)
Acute Urinary Retention °Acute urinary retention is the temporary inability to urinate. This is an uncommon problem in women. It can be caused by: °· Infection. °· A side effect of a medicine. °· A problem in a nearby organ that presses or squeezes on the bladder or the urethra (the tube that drains the bladder). °· Psychological problems. °·  Surgery on your bladder, urethra, or pelvic organs that causes obstruction to the outflow of urine from your bladder. °HOME CARE INSTRUCTIONS  °If you are sent home with a Foley catheter and a drainage system, you will need to discuss the best course of action with your health care provider. While the catheter is in, maintain a good intake of fluids. Keep the drainage bag emptied and lower than your catheter. This is so that contaminated urine will not flow back into your bladder, which could lead to a urinary tract infection. °There are two main types of drainage bags. One is a large bag that usually is used at night. It has a good capacity that will allow you to sleep through the night without having to empty it. The second type is called a leg bag. It has a smaller capacity so it needs to be emptied more frequently. However, the main advantage is that it can be attached by a leg strap and goes underneath your clothing, allowing you the freedom to move about or leave your home. °Only take over-the-counter or prescription medicines for pain, discomfort, or fever as directed by your health care provider.  °SEEK MEDICAL CARE IF: °· You develop a low-grade fever. °· You experience spasms or leakage of urine with the spasms. °SEEK IMMEDIATE MEDICAL CARE IF:  °· You develop chills or fever. °· Your catheter stops draining urine. °· Your catheter falls out. °· You start to develop increased bleeding that does not respond to rest and increased fluid intake. °MAKE SURE YOU: °· Understand these instructions. °· Will watch your condition. °· Will get help right away if you are not  doing well or get worse. °Document Released: 10/09/2006 Document Revised: 07/31/2013 Document Reviewed: 03/21/2013 °ExitCare® Patient Information ©2015 [employer]. This information is not intended to replace advice given to you by your health care provider. Make sure you discuss any questions you have with your health care provider. ° °

## 2015-07-15 ENCOUNTER — Encounter: Payer: Self-pay | Admitting: Obstetrics & Gynecology

## 2015-10-12 ENCOUNTER — Encounter (HOSPITAL_COMMUNITY): Payer: Self-pay | Admitting: Emergency Medicine

## 2015-10-12 ENCOUNTER — Emergency Department (HOSPITAL_COMMUNITY)
Admission: EM | Admit: 2015-10-12 | Discharge: 2015-10-12 | Disposition: A | Discharge status: Home / Self Care | Payer: Self-pay | Attending: Emergency Medicine | Admitting: Emergency Medicine

## 2015-10-12 DIAGNOSIS — Z79899 Other long term (current) drug therapy: Secondary | ICD-10-CM | POA: Insufficient documentation

## 2015-10-12 DIAGNOSIS — Z792 Long term (current) use of antibiotics: Secondary | ICD-10-CM | POA: Insufficient documentation

## 2015-10-12 DIAGNOSIS — Z8719 Personal history of other diseases of the digestive system: Secondary | ICD-10-CM | POA: Insufficient documentation

## 2015-10-12 DIAGNOSIS — F17210 Nicotine dependence, cigarettes, uncomplicated: Secondary | ICD-10-CM | POA: Insufficient documentation

## 2015-10-12 DIAGNOSIS — Z7982 Long term (current) use of aspirin: Secondary | ICD-10-CM | POA: Insufficient documentation

## 2015-10-12 DIAGNOSIS — H9190 Unspecified hearing loss, unspecified ear: Secondary | ICD-10-CM | POA: Insufficient documentation

## 2015-10-12 DIAGNOSIS — J069 Acute upper respiratory infection, unspecified: Secondary | ICD-10-CM | POA: Insufficient documentation

## 2015-10-12 DIAGNOSIS — M160 Bilateral primary osteoarthritis of hip: Secondary | ICD-10-CM | POA: Insufficient documentation

## 2015-10-12 DIAGNOSIS — Z86010 Personal history of colonic polyps: Secondary | ICD-10-CM | POA: Insufficient documentation

## 2015-10-12 DIAGNOSIS — H73893 Other specified disorders of tympanic membrane, bilateral: Secondary | ICD-10-CM | POA: Insufficient documentation

## 2015-10-12 DIAGNOSIS — H9203 Otalgia, bilateral: Secondary | ICD-10-CM | POA: Insufficient documentation

## 2015-10-12 NOTE — ED Notes (Signed)
Pt in reporting bilateral ear pain, states she had a cold a few days ago. Denies fever/chills/N/V.

## 2015-10-12 NOTE — ED Provider Notes (Signed)
CSN: [id number]     Arrival date & time 10/12/15  1931 History  By signing my name below, I, Soijett Blue, attest that this documentation has been prepared under the direction and in the presence of Glendell Docker, NP Electronically Signed: Soijett Blue, ED Scribe. 10/12/2015. 7:58 PM.   Chief Complaint  Patient presents with  . Otalgia     The history is provided by the patient. No language interpreter was used.    LAKEYTA HIRAKAWA is a 54 y.o. female who presents to the Emergency Department complaining of bilateral ear pain onset 3 days. She states that he had a cold several days ago that occurred prior to the onset of her bilateral ear pain. She notes that it feels like there is fluid in her bilateral ears. Pt is having associated symptoms of muffled hearing. She notes that she has tried robitussin-DM for the relief of her symptoms. She denies ear discharge, fever, and any other symptoms. Denies allergies to medications. Denies taking daily medications. Denies PMHx of HTN at this time.     Past Medical History  Diagnosis Date  . GERD (gastroesophageal reflux disease)   . Osteoarthritis     b/l hip, seen on CT abd in 05/2012  . Hiatal hernia     CT abdomen in August 2013  . Allergy     uses Albuterol inhaler during allergy season  . Diverticulosis 2013    Colonoscopy  . Hx of colonic polyps 2013    Colonoscopy    Past Surgical History  Procedure Laterality Date  . Tonsillectomy      childhood  . Total abdominal hysterectomy w/ bilateral salpingoophorectomy  2008    Fibroids, done by Dr. Clovia Cuff at [facility]   Family History  Problem Relation Age of Onset  . Hypertension Mother   . Hyperlipidemia Mother   . Stroke Father     passed away at 46 yo of cerebal brain hemorrhage   . Colon cancer Neg Hx   . Stomach cancer Neg Hx   . Colon polyps Neg Hx   . Rectal cancer Neg Hx    Social History  Substance Use Topics  . Smoking status: Current Some Day Smoker --  0.50 packs/day for 31 years    Types: Cigarettes  . Smokeless tobacco: Never Used  . Alcohol Use: 1.2 oz/week    2 Glasses of wine per week     Comment: 1-2 beers/week   OB History    Gravida Para Term Preterm AB TAB SAB Ectopic Multiple Living   3 3             Review of Systems  Constitutional: Negative for fever.  HENT: Positive for ear pain. Negative for ear discharge.   Skin: Negative for color change, rash and wound.  All other systems reviewed and are negative.     Allergies  Review of patient's allergies indicates no known allergies.  Home Medications   Prior to Admission medications   Medication Sig Start Date End Date Taking? Authorizing Provider  albuterol (PROVENTIL HFA;VENTOLIN HFA) 108 (90 BASE) MCG/ACT inhaler Inhale 2 puffs into the lungs every 6 (six) hours as needed. For shortness of breath    Historical Provider, MD  amoxicillin (AMOXIL) 500 MG capsule Take 1 capsule (500 mg total) by mouth 3 (three) times daily. 07/09/15   Delos Haring, PA-C  aspirin 81 MG chewable tablet Chew 81 mg by mouth daily.    Historical Provider, MD  meloxicam (  MOBIC) 15 MG tablet Take 1 tablet (15 mg total) by mouth daily as needed for pain. Take with food. 11/19/12   Carlos Levering Draper, DO  traMADol (ULTRAM) 50 MG tablet Take 1 tablet (50 mg total) by mouth every 6 (six) hours as needed. 07/09/15   Tiffany Carlota Raspberry, PA-C   BP 123/67 mmHg  Pulse 85  Temp(Src) 98 F (36.7 C) (Oral)  Resp 18  SpO2 99% Physical Exam  Constitutional: She is oriented to person, place, and time. She appears well-developed and well-nourished. No distress.  HENT:  Head: Normocephalic and atraumatic.  Right Ear: External ear and ear canal normal. Tympanic membrane is bulging.  Left Ear: External ear and ear canal normal. Tympanic membrane is bulging.  Nasal congestion noted  Eyes: EOM are normal.  Neck: Neck supple.  Cardiovascular: Normal rate, regular rhythm and normal heart sounds.  Exam reveals no  gallop and no friction rub.   No murmur heard. Pulmonary/Chest: Effort normal and breath sounds normal. No respiratory distress. She has no wheezes. She has no rales.  Musculoskeletal: Normal range of motion.  Neurological: She is alert and oriented to person, place, and time.  Skin: Skin is warm and dry.  Psychiatric: She has a normal mood and affect. Her behavior is normal.  Nursing note and vitals reviewed.   ED Course  Procedures (including critical care time) DIAGNOSTIC STUDIES:    COORDINATION OF CARE: 7:56 PM Discussed treatment plan with pt at bedside which includes use zyrtec or benadryl PRN and pt agreed to plan.    Labs Review Labs Reviewed - No data to display  Imaging Review No results found.    EKG Interpretation None      MDM   Final diagnoses:  URI (upper respiratory infection)   Likely viral. Don't think antiobiotics are needed a this time  I personally performed the services described in this documentation, which was scribed in my presence. The recorded information has been reviewed and is accurate.    Glendell Docker, NP 10/12/15 2004  Lajean Saver, MD 10/13/15 1524

## 2015-10-12 NOTE — Discharge Instructions (Signed)
Upper Respiratory Infection, Adult Most upper respiratory infections (URIs) are a viral infection of the air passages leading to the lungs. A URI affects the nose, throat, and upper air passages. The most common type of URI is nasopharyngitis and is typically referred to as "the common cold." URIs run their course and usually go away on their own. Most of the time, a URI does not require medical attention, but sometimes a bacterial infection in the upper airways can follow a viral infection. This is called a secondary infection. Sinus and middle ear infections are common types of secondary upper respiratory infections. Bacterial pneumonia can also complicate a URI. A URI can worsen asthma and chronic obstructive pulmonary disease (COPD). Sometimes, these complications can require emergency medical care and may be life threatening.  CAUSES Almost all URIs are caused by viruses. A virus is a type of germ and can spread from one person to another.  RISKS FACTORS You may be at risk for a URI if:   You smoke.   You have chronic heart or lung disease.  You have a weakened defense (immune) system.   You are very young or very old.   You have nasal allergies or asthma.  You work in crowded or poorly ventilated areas.  You work in health care facilities or schools. SIGNS AND SYMPTOMS  Symptoms typically develop 2-3 days after you come in contact with a cold virus. Most viral URIs last 7-10 days. However, viral URIs from the influenza virus (flu virus) can last 14-18 days and are typically more severe. Symptoms may include:   Runny or stuffy (congested) nose.   Sneezing.   Cough.   Sore throat.   Headache.   Fatigue.   Fever.   Loss of appetite.   Pain in your forehead, behind your eyes, and over your cheekbones (sinus pain).  Muscle aches.  DIAGNOSIS  Your health care provider may diagnose a URI by:  Physical exam.  Tests to check that your symptoms are not due to  another condition such as:  Strep throat.  Sinusitis.  Pneumonia.  Asthma. TREATMENT  A URI goes away on its own with time. It cannot be cured with medicines, but medicines may be prescribed or recommended to relieve symptoms. Medicines may help:  Reduce your fever.  Reduce your cough.  Relieve nasal congestion. HOME CARE INSTRUCTIONS   Take medicines only as directed by your health care provider.   Gargle warm saltwater or take cough drops to comfort your throat as directed by your health care provider.  Use a warm mist humidifier or inhale steam from a shower to increase air moisture. This may make it easier to breathe.  Drink enough fluid to keep your urine clear or pale yellow.   Eat soups and other clear broths and maintain good nutrition.   Rest as needed.   Return to work when your temperature has returned to normal or as your health care provider advises. You may need to stay home longer to avoid infecting others. You can also use a face mask and careful hand washing to prevent spread of the virus.  Increase the usage of your inhaler if you have asthma.   Do not use any tobacco products, including cigarettes, chewing tobacco, or electronic cigarettes. If you need help quitting, ask your health care provider. PREVENTION  The best way to protect yourself from getting a cold is to practice good hygiene.   Avoid oral or hand contact with people with cold   symptoms.   Wash your hands often if contact occurs.  There is no clear evidence that vitamin C, vitamin E, echinacea, or exercise reduces the chance of developing a cold. However, it is always recommended to get plenty of rest, exercise, and practice good nutrition.  SEEK MEDICAL CARE IF:   You are getting worse rather than better.   Your symptoms are not controlled by medicine.   You have chills.  You have worsening shortness of breath.  You have brown or red mucus.  You have yellow or brown nasal  discharge.  You have pain in your face, especially when you bend forward.  You have a fever.  You have swollen neck glands.  You have pain while swallowing.  You have white areas in the back of your throat. SEEK IMMEDIATE MEDICAL CARE IF:   You have severe or persistent:  Headache.  Ear pain.  Sinus pain.  Chest pain.  You have chronic lung disease and any of the following:  Wheezing.  Prolonged cough.  Coughing up blood.  A change in your usual mucus.  You have a stiff neck.  You have changes in your:  Vision.  Hearing.  Thinking.  Mood. MAKE SURE YOU:   Understand these instructions.  Will watch your condition.  Will get help right away if you are not doing well or get worse.   This information is not intended to replace advice given to you by your health care provider. Make sure you discuss any questions you have with your health care provider.   Document Released: 04/05/2001 Document Revised: 02/24/2015 Document Reviewed: 01/15/2014 Elsevier Interactive Patient Education 2016 [employer]  

## 2015-10-12 NOTE — ED Notes (Signed)
See EDP assessment 

## 2018-12-16 ENCOUNTER — Encounter (HOSPITAL_COMMUNITY): Payer: Self-pay

## 2018-12-16 ENCOUNTER — Other Ambulatory Visit: Payer: Self-pay

## 2018-12-16 DIAGNOSIS — F17210 Nicotine dependence, cigarettes, uncomplicated: Secondary | ICD-10-CM | POA: Insufficient documentation

## 2018-12-16 DIAGNOSIS — R066 Hiccough: Secondary | ICD-10-CM | POA: Insufficient documentation

## 2018-12-16 DIAGNOSIS — Z7982 Long term (current) use of aspirin: Secondary | ICD-10-CM | POA: Insufficient documentation

## 2018-12-16 DIAGNOSIS — R112 Nausea with vomiting, unspecified: Secondary | ICD-10-CM | POA: Insufficient documentation

## 2018-12-16 LAB — CBC
HEMATOCRIT: 40.9 % (ref 36.0–46.0)
HEMOGLOBIN: 13.3 g/dL (ref 12.0–15.0)
MCH: 29.7 pg (ref 26.0–34.0)
MCHC: 32.5 g/dL (ref 30.0–36.0)
MCV: 91.3 fL (ref 80.0–100.0)
Platelets: 246 10*3/uL (ref 150–400)
RBC: 4.48 MIL/uL (ref 3.87–5.11)
RDW: 12.4 % (ref 11.5–15.5)
WBC: 9.8 10*3/uL (ref 4.0–10.5)
nRBC: 0 % (ref 0.0–0.2)

## 2018-12-16 LAB — COMPREHENSIVE METABOLIC PANEL
ALBUMIN: 4 g/dL (ref 3.5–5.0)
ALK PHOS: 82 U/L (ref 38–126)
ALT: 14 U/L (ref 0–44)
AST: 15 U/L (ref 15–41)
Anion gap: 7 (ref 5–15)
BILIRUBIN TOTAL: 0.6 mg/dL (ref 0.3–1.2)
BUN: 17 mg/dL (ref 6–20)
CO2: 31 mmol/L (ref 22–32)
Calcium: 9.3 mg/dL (ref 8.9–10.3)
Chloride: 101 mmol/L (ref 98–111)
Creatinine, Ser: 0.79 mg/dL (ref 0.44–1.00)
GFR calc Af Amer: >60 mL/min (ref >60)
GFR calc non Af Amer: >60 mL/min (ref >60)
GLUCOSE: 108 mg/dL — AB (ref 70–99)
POTASSIUM: 3.5 mmol/L (ref 3.5–5.1)
SODIUM: 139 mmol/L (ref 135–145)
TOTAL PROTEIN: 6.8 g/dL (ref 6.5–8.1)

## 2018-12-16 LAB — I-STAT BETA HCG BLOOD, ED (MC, WL, AP ONLY)

## 2018-12-16 LAB — LIPASE, BLOOD: Lipase: 51 U/L (ref 11–51)

## 2018-12-16 MED ORDER — SODIUM CHLORIDE 0.9% FLUSH
3.0000 mL | Freq: Once | INTRAVENOUS | Status: AC
  Administered 2018-12-17: 3 mL via INTRAVENOUS

## 2018-12-16 NOTE — ED Notes (Signed)
Pt given given labeled urine cup to provide urine sample when able

## 2018-12-16 NOTE — ED Triage Notes (Signed)
States since Friday she has been hiccupping and vomiting and unable to keep anything down chest soreness voiced from vomiting and hurts to breathe.

## 2018-12-17 ENCOUNTER — Emergency Department (HOSPITAL_COMMUNITY)
Admission: EM | Admit: 2018-12-17 | Discharge: 2018-12-17 | Disposition: A | Discharge status: Home / Self Care | Payer: Self-pay | Attending: Emergency Medicine | Admitting: Emergency Medicine

## 2018-12-17 DIAGNOSIS — R066 Hiccough: Secondary | ICD-10-CM

## 2018-12-17 DIAGNOSIS — R112 Nausea with vomiting, unspecified: Secondary | ICD-10-CM

## 2018-12-17 LAB — URINALYSIS, ROUTINE W REFLEX MICROSCOPIC
Bilirubin Urine: NEGATIVE
Glucose, UA: NEGATIVE mg/dL
HGB URINE DIPSTICK: NEGATIVE
Ketones, ur: NEGATIVE mg/dL
Leukocytes,Ua: NEGATIVE
Nitrite: NEGATIVE
PH: 5 (ref 5.0–8.0)
Protein, ur: NEGATIVE mg/dL
SPECIFIC GRAVITY, URINE: 1.023 (ref 1.005–1.030)

## 2018-12-17 MED ORDER — METOCLOPRAMIDE HCL 5 MG/ML IJ SOLN
10.0000 mg | Freq: Once | INTRAMUSCULAR | Status: AC
  Administered 2018-12-17: 10 mg via INTRAVENOUS
  Filled 2018-12-17: qty 2

## 2018-12-17 MED ORDER — METOCLOPRAMIDE HCL 10 MG PO TABS: 10.0000 mg | ORAL_TABLET | Freq: Four times a day (QID) | ORAL | 0 refills | Status: DC | PRN

## 2018-12-17 MED ORDER — DIPHENHYDRAMINE HCL 50 MG/ML IJ SOLN
25.0000 mg | Freq: Once | INTRAMUSCULAR | Status: AC
  Administered 2018-12-17: 25 mg via INTRAVENOUS
  Filled 2018-12-17: qty 1

## 2018-12-17 MED ORDER — SODIUM CHLORIDE 0.9 % IV BOLUS
1000.0000 mL | Freq: Once | INTRAVENOUS | Status: AC
  Administered 2018-12-17: 1000 mL via INTRAVENOUS

## 2018-12-17 NOTE — ED Provider Notes (Signed)
[facility] DEPT Provider Note   CSN: [id number] Arrival date & time: 12/16/18  2218    History   Chief Complaint Chief Complaint  Patient presents with  . Emesis    hiccups and not eating    HPI [name] is a 58 y.o. female.   The history is provided by the patient.  She has history of GERD, hyperlipidemia and comes in with complaints of vomiting and hiccups over the last 2 days.  She is vomiting approximately 6 times a day.  There is some burning discomfort in her chest associated with it.  She denies fever or chills and denies arthralgias or myalgias.  She denies fever or chills.  She is feeling generally weak.  She has taken antacids without relief.  Past Medical History:  Diagnosis Date  . Allergy    uses Albuterol inhaler during allergy season  . Diverticulosis 2013   Colonoscopy  . GERD (gastroesophageal reflux disease)   . Hiatal hernia    CT abdomen in August 2013  . Hx of colonic polyps 2013   Colonoscopy   . Osteoarthritis    b/l hip, seen on CT abd in 05/2012    Patient Active Problem List   Diagnosis Date Noted  . Rash and nonspecific skin eruption 05/01/2013  . DJD (degenerative joint disease) of knee 11/19/2012  . HLD (hyperlipidemia) 11/13/2012  . Osteoarthritis of left knee 07/26/2012  . GERD (gastroesophageal reflux disease) 06/15/2012  . Health care maintenance 06/15/2012    Past Surgical History:  Procedure Laterality Date  . TONSILLECTOMY     childhood  . TOTAL ABDOMINAL HYSTERECTOMY W/ BILATERAL SALPINGOOPHORECTOMY  2008   Fibroids, done by Dr. Clovia Cuff at [facility]     OB History    Gravida  3   Para  3   Term      Preterm      AB      Living        SAB      TAB      Ectopic      Multiple      Live Births               Home Medications    Prior to Admission medications   Medication Sig Start Date End Date Taking? Authorizing Provider  albuterol (PROVENTIL  HFA;VENTOLIN HFA) 108 (90 BASE) MCG/ACT inhaler Inhale 2 puffs into the lungs every 6 (six) hours as needed. For shortness of breath    [provider]  amoxicillin (AMOXIL) 500 MG capsule Take 1 capsule (500 mg total) by mouth 3 (three) times daily. 07/09/15   Delos Haring, PA-C  aspirin 81 MG chewable tablet Chew 81 mg by mouth daily.    [provider]  meloxicam (MOBIC) 15 MG tablet Take 1 tablet (15 mg total) by mouth daily as needed for pain. Take with food. 11/19/12   Thurman Coyer, DO  traMADol (ULTRAM) 50 MG tablet Take 1 tablet (50 mg total) by mouth every 6 (six) hours as needed. 07/09/15   Delos Haring, PA-C    Family History Family History  Problem Relation Age of Onset  . Stroke Father        passed away at 58 yo of cerebal brain hemorrhage   . Hypertension Mother   . Hyperlipidemia Mother   . Colon cancer Neg Hx   . Stomach cancer Neg Hx   . Colon polyps Neg Hx   .  Rectal cancer Neg Hx     Social History Social History   Tobacco Use  . Smoking status: Current Some Day Smoker    Packs/day: 0.50    Years: 31.00    Pack years: 15.50    Types: Cigarettes  . Smokeless tobacco: Never Used  Substance Use Topics  . Alcohol use: Yes    Alcohol/week: 2.0 standard drinks    Types: 2 Glasses of wine per week    Comment: 1-2 beers/week  . Drug use: No     Allergies   Patient has no known allergies.   Review of Systems Review of Systems  All other systems reviewed and are negative.    Physical Exam Updated Vital Signs BP 108/63 (BP Location: Left Arm)   Pulse (!) 48   Temp 98.2 F (36.8 C) (Oral)   Resp 17   Ht 5\' 10"  (1.778 m)   Wt 90.7 kg   SpO2 100%   BMI 28.70 kg/m   Physical Exam Vitals signs and nursing note reviewed.    58 year old female, resting comfortably and in no acute distress. Vital signs are normal. Oxygen saturation is 100%, which is normal. Head is normocephalic and atraumatic. PERRLA, EOMI. Oropharynx is  clear. Neck is nontender and supple without adenopathy or JVD. Back is nontender and there is no CVA tenderness. Lungs are clear without rales, wheezes, or rhonchi. Chest is nontender. Heart has regular rate and rhythm without murmur. Abdomen is soft, flat, nontender without masses or hepatosplenomegaly and peristalsis is hypoactive. Extremities have no cyanosis or edema, full range of motion is present. Skin is warm and dry without rash. Neurologic: Mental status is normal, cranial nerves are intact, there are no motor or sensory deficits.  ED Treatments / Results  Labs (all labs ordered are listed, but only abnormal results are displayed) Labs Reviewed  COMPREHENSIVE METABOLIC PANEL - Abnormal; Notable for the following components:      Result Value   Glucose, Bld 108 (*)    All other components within normal limits  URINALYSIS, ROUTINE W REFLEX MICROSCOPIC - Abnormal; Notable for the following components:   APPearance HAZY (*)    All other components within normal limits  LIPASE, BLOOD  CBC  I-STAT BETA HCG BLOOD, ED ([facility], [facility], [facility] ONLY)   Procedures Procedures   Medications Ordered in ED Medications  sodium chloride flush (NS) 0.9 % injection 3 mL (3 mLs Intravenous Given 12/17/18 0242)  sodium chloride 0.9 % bolus 1,000 mL (0 mLs Intravenous Stopped 12/17/18 0336)  metoCLOPramide (REGLAN) injection 10 mg (10 mg Intravenous Given 12/17/18 0241)  diphenhydrAMINE (BENADRYL) injection 25 mg (25 mg Intravenous Given 12/17/18 0241)     Initial Impression / Assessment and Plan / ED Course  I have reviewed the triage vital signs and the nursing notes.  Pertinent lab results that were available during my care of the patient were reviewed by me and considered in my medical decision making (see chart for details).  Nausea, vomiting, hiccups of uncertain cause.  Suspect viral gastritis.  No red flags to suggest more serious pathology.  Old records are reviewed, and she had an ED visit in  2013 for hiccups.  Given chest discomfort, will check ECG.  Other initial labs are normal.  We will give IV fluids, metoclopramide which should help both nausea and hiccups.  She noted considerable but not complete relief of symptoms with above-noted treatment.  She was felt to be stable for discharge.  Labs are  reassuring.  ECG shows no acute changes.  She is discharged with prescription for metoclopramide, follow-up with PCP.  Return precautions discussed.  Final Clinical Impressions(s) / ED Diagnoses   Final diagnoses:  Non-intractable vomiting with nausea, unspecified vomiting type  [place]    ED Discharge Orders         Ordered    metoCLOPramide (REGLAN) 10 MG tablet  Every 6 hours PRN     12/17/18 6440           Delora Fuel, MD 34/74/25 0400

## 2018-12-17 NOTE — Discharge Instructions (Addendum)
Return if symptoms are getting worse. °

## 2018-12-28 ENCOUNTER — Emergency Department (HOSPITAL_COMMUNITY)
Admission: EM | Admit: 2018-12-28 | Discharge: 2018-12-28 | Disposition: A | Discharge status: Home / Self Care | Payer: Self-pay | Attending: Emergency Medicine | Admitting: Emergency Medicine

## 2018-12-28 ENCOUNTER — Emergency Department (HOSPITAL_COMMUNITY): Payer: Self-pay

## 2018-12-28 ENCOUNTER — Encounter (HOSPITAL_COMMUNITY): Payer: Self-pay | Admitting: Emergency Medicine

## 2018-12-28 ENCOUNTER — Other Ambulatory Visit: Payer: Self-pay

## 2018-12-28 DIAGNOSIS — R066 Hiccough: Secondary | ICD-10-CM | POA: Insufficient documentation

## 2018-12-28 DIAGNOSIS — Z7982 Long term (current) use of aspirin: Secondary | ICD-10-CM | POA: Insufficient documentation

## 2018-12-28 DIAGNOSIS — F17210 Nicotine dependence, cigarettes, uncomplicated: Secondary | ICD-10-CM | POA: Insufficient documentation

## 2018-12-28 DIAGNOSIS — R1013 Epigastric pain: Secondary | ICD-10-CM

## 2018-12-28 DIAGNOSIS — R112 Nausea with vomiting, unspecified: Secondary | ICD-10-CM | POA: Insufficient documentation

## 2018-12-28 LAB — CBC WITH DIFFERENTIAL/PLATELET
Abs Immature Granulocytes: 0.02 10*3/uL (ref 0.00–0.07)
BASOS PCT: 1 %
Basophils Absolute: 0.1 10*3/uL (ref 0.0–0.1)
EOS ABS: 0.1 10*3/uL (ref 0.0–0.5)
Eosinophils Relative: 1 %
HCT: 44.9 % (ref 36.0–46.0)
Hemoglobin: 14.3 g/dL (ref 12.0–15.0)
Immature Granulocytes: 0 %
Lymphocytes Relative: 16 %
Lymphs Abs: 1.4 10*3/uL (ref 0.7–4.0)
MCH: 29 pg (ref 26.0–34.0)
MCHC: 31.8 g/dL (ref 30.0–36.0)
MCV: 91.1 fL (ref 80.0–100.0)
MONO ABS: 0.6 10*3/uL (ref 0.1–1.0)
MONOS PCT: 7 %
NEUTROS ABS: 6.8 10*3/uL (ref 1.7–7.7)
NEUTROS PCT: 75 %
PLATELETS: 268 10*3/uL (ref 150–400)
RBC: 4.93 MIL/uL (ref 3.87–5.11)
RDW: 12.4 % (ref 11.5–15.5)
WBC: 9.1 10*3/uL (ref 4.0–10.5)
nRBC: 0 % (ref 0.0–0.2)

## 2018-12-28 LAB — TSH: TSH: 0.472 u[IU]/mL (ref 0.350–4.500)

## 2018-12-28 LAB — URINALYSIS, ROUTINE W REFLEX MICROSCOPIC
Bilirubin Urine: NEGATIVE
Glucose, UA: NEGATIVE mg/dL
Hgb urine dipstick: NEGATIVE
Ketones, ur: 5 mg/dL — AB
Leukocytes,Ua: NEGATIVE
Nitrite: NEGATIVE
PH: 9 — AB (ref 5.0–8.0)
Protein, ur: NEGATIVE mg/dL
Specific Gravity, Urine: 1.031 — ABNORMAL HIGH (ref 1.005–1.030)

## 2018-12-28 LAB — LIPASE, BLOOD: LIPASE: 48 U/L (ref 11–51)

## 2018-12-28 LAB — COMPREHENSIVE METABOLIC PANEL
ALK PHOS: 88 U/L (ref 38–126)
ALT: 16 U/L (ref 0–44)
AST: 16 U/L (ref 15–41)
Albumin: 4.3 g/dL (ref 3.5–5.0)
Anion gap: 8 (ref 5–15)
BUN: 13 mg/dL (ref 6–20)
CALCIUM: 9.8 mg/dL (ref 8.9–10.3)
CO2: 26 mmol/L (ref 22–32)
CREATININE: 0.84 mg/dL (ref 0.44–1.00)
Chloride: 103 mmol/L (ref 98–111)
Glucose, Bld: 105 mg/dL — ABNORMAL HIGH (ref 70–99)
Potassium: 3.8 mmol/L (ref 3.5–5.1)
SODIUM: 137 mmol/L (ref 135–145)
Total Bilirubin: 1.1 mg/dL (ref 0.3–1.2)
Total Protein: 7.3 g/dL (ref 6.5–8.1)

## 2018-12-28 LAB — MAGNESIUM: MAGNESIUM: 2.5 mg/dL — AB (ref 1.7–2.4)

## 2018-12-28 LAB — I-STAT TROPONIN, ED
Troponin i, poc: 0 ng/mL (ref 0.00–0.08)
Troponin i, poc: 0 ng/mL (ref 0.00–0.08)

## 2018-12-28 LAB — LACTIC ACID, PLASMA: LACTIC ACID, VENOUS: 1.8 mmol/L (ref 0.5–1.9)

## 2018-12-28 MED ORDER — IOPAMIDOL (ISOVUE-300) INJECTION 61%
INTRAVENOUS | Status: AC
  Filled 2018-12-28: qty 100

## 2018-12-28 MED ORDER — IOPAMIDOL (ISOVUE-300) INJECTION 61%
100.0000 mL | Freq: Once | INTRAVENOUS | Status: AC | PRN
  Administered 2018-12-28: 100 mL via INTRAVENOUS

## 2018-12-28 MED ORDER — ONDANSETRON HCL 4 MG/2ML IJ SOLN: 4.0000 mg | Freq: Once | INTRAMUSCULAR | Status: DC

## 2018-12-28 MED ORDER — ONDANSETRON HCL 4 MG/2ML IJ SOLN
4.0000 mg | Freq: Once | INTRAMUSCULAR | Status: AC
  Administered 2018-12-28: 4 mg via INTRAVENOUS
  Filled 2018-12-28: qty 2

## 2018-12-28 MED ORDER — SODIUM CHLORIDE (PF) 0.9 % IJ SOLN
INTRAMUSCULAR | Status: AC
  Filled 2018-12-28: qty 50

## 2018-12-28 MED ORDER — RANITIDINE HCL 150 MG PO TABS: 150.0000 mg | ORAL_TABLET | Freq: Two times a day (BID) | ORAL | 0 refills | Status: DC

## 2018-12-28 MED ORDER — CHLORPROMAZINE HCL 25 MG PO TABS
25.0000 mg | ORAL_TABLET | Freq: Once | ORAL | Status: AC
  Administered 2018-12-28: 25 mg via ORAL
  Filled 2018-12-28: qty 1

## 2018-12-28 MED ORDER — BENZONATATE 100 MG PO CAPS: 100.0000 mg | ORAL_CAPSULE | Freq: Three times a day (TID) | ORAL | 0 refills | Status: DC

## 2018-12-28 MED ORDER — CHLORPROMAZINE HCL 25 MG PO TABS: 25.0000 mg | ORAL_TABLET | Freq: Three times a day (TID) | ORAL | 0 refills | Status: DC

## 2018-12-28 NOTE — ED Notes (Signed)
Patient transported to CT 

## 2018-12-28 NOTE — ED Notes (Signed)
ED Provider at bedside. 

## 2018-12-28 NOTE — ED Provider Notes (Signed)
[facility] DEPT Provider Note   CSN: [id number] Arrival date & time: 12/28/18  1127    History   Chief Complaint Chief Complaint  Patient presents with  . Hiccups  . Emesis    HPI [name] is a 58 y.o. female.     The history is provided by the patient and medical records. No language interpreter was used.  Abdominal Pain  Pain location:  Epigastric, LUQ and RUQ Pain quality: aching and cramping   Pain radiates to:  Does not radiate Pain severity:  Moderate Onset quality:  Gradual Duration:  3 weeks Timing:  Constant Progression:  Waxing and waning Chronicity:  New Relieved by:  Nothing Worsened by:  Nothing Ineffective treatments:  None tried Associated symptoms: belching, chills, constipation, fatigue, nausea and vomiting   Associated symptoms: no chest pain, no cough, no diarrhea, no dysuria, no fever, no flatus, no shortness of breath, no vaginal bleeding and no vaginal discharge     Past Medical History:  Diagnosis Date  . Allergy    uses Albuterol inhaler during allergy season  . Diverticulosis 2013   Colonoscopy  . GERD (gastroesophageal reflux disease)   . Hiatal hernia    CT abdomen in August 2013  . Hx of colonic polyps 2013   Colonoscopy   . Osteoarthritis    b/l hip, seen on CT abd in 05/2012    Patient Active Problem List   Diagnosis Date Noted  . Rash and nonspecific skin eruption 05/01/2013  . DJD (degenerative joint disease) of knee 11/19/2012  . HLD (hyperlipidemia) 11/13/2012  . Osteoarthritis of left knee 07/26/2012  . GERD (gastroesophageal reflux disease) 06/15/2012  . Health care maintenance 06/15/2012    Past Surgical History:  Procedure Laterality Date  . TONSILLECTOMY     childhood  . TOTAL ABDOMINAL HYSTERECTOMY W/ BILATERAL SALPINGOOPHORECTOMY  2008   Fibroids, done by Dr. Clovia Cuff at [facility]     OB History    Gravida  3   Para  3   Term      Preterm      AB      Living        SAB      TAB      Ectopic      Multiple      Live Births               Home Medications    Prior to Admission medications   Medication Sig Start Date End Date Taking? Authorizing Provider  albuterol (PROVENTIL HFA;VENTOLIN HFA) 108 (90 BASE) MCG/ACT inhaler Inhale 2 puffs into the lungs every 6 (six) hours as needed. For shortness of breath    [provider]  aspirin 81 MG chewable tablet Chew 81 mg by mouth daily.    [provider]  metoCLOPramide (REGLAN) 10 MG tablet Take 1 tablet (10 mg total) by mouth every 6 (six) hours as needed for nausea (or hiccups). 1/57/26   Delora Fuel, MD    Family History Family History  Problem Relation Age of Onset  . Stroke Father        passed away at 46 yo of cerebal brain hemorrhage   . Hypertension Mother   . Hyperlipidemia Mother   . Colon cancer Neg Hx   . Stomach cancer Neg Hx   . Colon polyps Neg Hx   . Rectal cancer Neg Hx     Social History Social History   Tobacco  Use  . Smoking status: Current Some Day Smoker    Packs/day: 0.50    Years: 31.00    Pack years: 15.50    Types: Cigarettes  . Smokeless tobacco: Never Used  Substance Use Topics  . Alcohol use: Yes    Alcohol/week: 2.0 standard drinks    Types: 2 Glasses of wine per week    Comment: 1-2 beers/week  . Drug use: No     Allergies   Patient has no known allergies.   Review of Systems Review of Systems  Constitutional: Positive for chills and fatigue. Negative for diaphoresis and fever.  HENT: Negative for congestion and rhinorrhea.   Eyes: Negative for visual disturbance.  Respiratory: Negative for cough, chest tightness, shortness of breath and wheezing.   Cardiovascular: Negative for chest pain, palpitations and leg swelling.  Gastrointestinal: Positive for abdominal pain, constipation, nausea and vomiting. Negative for abdominal distention, diarrhea and flatus.  Genitourinary: Negative for dysuria, flank  pain, frequency, vaginal bleeding and vaginal discharge.  Musculoskeletal: Negative for back pain, neck pain and neck stiffness.  Skin: Negative for rash and wound.  Neurological: Positive for light-headedness. Negative for dizziness, tremors, syncope, weakness and headaches.  Psychiatric/Behavioral: Negative for agitation.  All other systems reviewed and are negative.    Physical Exam Updated Vital Signs BP 136/90 (BP Location: Right Arm)   Pulse 76   Temp 98.7 F (37.1 C) (Oral)   Resp 20   SpO2 100%   Physical Exam Vitals signs and nursing note reviewed.  Constitutional:      General: She is not in acute distress.    Appearance: She is well-developed. She is not ill-appearing, toxic-appearing or diaphoretic.  HENT:     Head: Normocephalic and atraumatic.     Nose: No congestion or rhinorrhea.     Mouth/Throat:     Mouth: Mucous membranes are dry.     Pharynx: No oropharyngeal exudate or posterior oropharyngeal erythema.  Eyes:     Conjunctiva/sclera: Conjunctivae normal.     Pupils: Pupils are equal, round, and reactive to light.  Neck:     Musculoskeletal: Neck supple. No muscular tenderness.  Cardiovascular:     Rate and Rhythm: Regular rhythm. Bradycardia present.     Pulses: Normal pulses.     Heart sounds: No murmur.  Pulmonary:     Effort: Pulmonary effort is normal. No respiratory distress.     Breath sounds: Normal breath sounds. No stridor. No wheezing, rhonchi or rales.  Chest:     Chest wall: No tenderness.  Abdominal:     Palpations: Abdomen is soft. There is no mass.     Tenderness: There is abdominal tenderness. There is no right CVA tenderness or left CVA tenderness.  Musculoskeletal:        General: No tenderness.     Right lower leg: No edema.     Left lower leg: No edema.  Skin:    General: Skin is warm and dry.     Capillary Refill: Capillary refill takes less than 2 seconds.     Findings: No erythema.  Neurological:     General: No focal  deficit present.     Mental Status: She is alert and oriented to person, place, and time.     Sensory: No sensory deficit.     Motor: No weakness.      ED Treatments / Results  Labs (all labs ordered are listed, but only abnormal results are displayed) Labs Reviewed  MAGNESIUM -  Abnormal; Notable for the following components:      Result Value   Magnesium 2.5 (*)    All other components within normal limits  COMPREHENSIVE METABOLIC PANEL - Abnormal; Notable for the following components:   Glucose, Bld 105 (*)    All other components within normal limits  URINALYSIS, ROUTINE W REFLEX MICROSCOPIC - Abnormal; Notable for the following components:   APPearance HAZY (*)    Specific Gravity, Urine 1.031 (*)    pH 9.0 (*)    Ketones, ur 5 (*)    All other components within normal limits  URINE CULTURE  TSH  CBC WITH DIFFERENTIAL/PLATELET  LIPASE, BLOOD  LACTIC ACID, PLASMA  I-STAT TROPONIN, ED  I-STAT TROPONIN, ED    EKG EKG Interpretation  Date/Time:  Friday December 28 2018 12:20:46 EST Ventricular Rate:  42 PR Interval:    QRS Duration: 98 QT Interval:  497 QTC Calculation: 416 R Axis:   58 Text Interpretation:  Sinus bradycardia Minimal ST elevation, inferior leads Baseline wander in lead(s) V5 V6 When compared to prior, slower rate.  No STEMI Confirmed by Antony Blackbird [phone]) on 12/28/2018 12:43:35 PM   Radiology Ct Abdomen Pelvis W Contrast  Result Date: 12/28/2018 CLINICAL DATA:  Nausea, vomiting, abdominal pain, decreased bowel movements and decreased flatus. EXAM: CT ABDOMEN AND PELVIS WITH CONTRAST TECHNIQUE: Multidetector CT imaging of the abdomen and pelvis was performed using the standard protocol following bolus administration of intravenous contrast. CONTRAST:  159mL ISOVUE-300 IOPAMIDOL (ISOVUE-300) INJECTION 61% COMPARISON:  06/05/2012. FINDINGS: Lower chest: Minimal bilateral dependent atelectasis. Hepatobiliary: Tiny gallstones in the dependent portion of the  gallbladder. No gallbladder wall thickening or pericholecystic fluid. Unremarkable liver. Pancreas: Unremarkable. No pancreatic ductal dilatation or surrounding inflammatory changes. Spleen: Normal in size without focal abnormality. Adrenals/Urinary Tract: Normal appearing adrenal glands. Small bilateral renal cysts. Unremarkable urinary bladder and ureters. Stomach/Bowel: Small number of colonic diverticula without evidence of diverticulitis. Normal appearing stomach, small bowel and appendix. Vascular/Lymphatic: Atheromatous arterial calcifications without aneurysm. No enlarged lymph nodes. Reproductive: Status post hysterectomy. No adnexal masses. Other: No abdominal wall hernia or abnormality. No abdominopelvic ascites. Musculoskeletal: Multiple small bone islands. Mild bilateral hip degenerative changes. Lumbar and lower thoracic spine degenerative changes. IMPRESSION: 1. No acute abnormality. 2. Mild colonic diverticulosis. 3. Cholelithiasis. Electronically Signed   By: Claudie Revering M.D.   On: 12/28/2018 14:40    Procedures Procedures (including critical care time)  Medications Ordered in ED Medications  iopamidol (ISOVUE-300) 61 % injection (has no administration in time range)  sodium chloride (PF) 0.9 % injection (has no administration in time range)  ondansetron (ZOFRAN) injection 4 mg (4 mg Intravenous Not Given 12/28/18 1505)  chlorproMAZINE (THORAZINE) tablet 25 mg (has no administration in time range)  ondansetron (ZOFRAN) injection 4 mg (4 mg Intravenous Given 12/28/18 1230)  iopamidol (ISOVUE-300) 61 % injection 100 mL (100 mLs Intravenous Contrast Given 12/28/18 1354)  ondansetron (ZOFRAN) injection 4 mg (4 mg Intravenous Given 12/28/18 1458)     Initial Impression / Assessment and Plan / ED Course  I have reviewed the triage vital signs and the nursing notes.  Pertinent labs & imaging results that were available during my care of the patient were reviewed by me and considered in my  medical decision making (see chart for details).        [name] is a 58 y.o. female with a past medical history significant for GERD, diverticulosis, hiatal hernia who presents for nausea, vomiting, abdominal discomfort,  constipation, decreased flatus, lightheadedness, fatigue, and hiccups.  Patient reports that 3 weeks ago she was seen in the emergency department for similar symptoms with primarily nausea, vomiting, hiccuping and had a reassuring exam and work-up and was given Reglan and fluids and was able to go home.  She reports that after leaving she had return of the hiccuping and she has not had any relief in symptoms in the last 3 weeks.  She reports that she has had worsening abdominal pain over the last few days and has not had a bowel movement in the last 5 days.  She reports her nausea and vomiting is preventing her from staying hydrated with eating and drinking.  She reports she is having decreased flatus.  She reports he is doing very fatigued and tired.  She denies chest pain, palpitations or shortness of breath.  She denies any fevers, congestion, or cough.  She has been feeling like she cannot get warm.  She reports abdominal pain is moderate to severe in her upper abdomen.  No lower abdominal pain.  No diarrhea.  She does report a decreased urination.  No significant back pain headaches, or neck stiffness.  No recent trauma.  On arrival, patient's heart rate was found to be in the low 40s.  It appeared to be a sinus rhythm.  Oxygen saturations were normal on room air.  Patient was not hypotensive.  She was afebrile.  On exam, patient's lungs were clear and chest was nontender.  Patient did have upper abdominal tenderness.  No flank tenderness or lower abdominal tenderness.  No CVA tenderness.  Patient had no significant lower extremity edema.  Normal pulses in extremities.  No focal neurologic deficits on initial exam.  Clinically I am concerned that patient symptoms may be  related to diaphragmatic irritation from her hiatal hernia versus diverticulitis or other intra-abdominal pathology.  I am also concerned about the patient having new hypothyroidism with her inability to get warm, bradycardia, fatigue, and some GI symptoms.  Patient will have screening laboratory testing as well as imaging to look for obstruction or other abnormality in her abdomen.  She will be given fluids and nausea medicine and monitor.  Anticipate reassessment for work-up.  4:08 PM Patient's work-up was overall reassuring.  Patient had normal TSH.  Metabolic panel and lipase were normal.  Troponin normal.  Urinalysis showed no infection.  Mild ketones likely negative mild dehydration.  Magnesium just barely elevated.  No anemia.  CT scan showed no acute abnormalities.  Patient does have gallstones but no evidence of acute cholecystitis.  Patient reports he still having hiccups and some nausea after Zofran.  Patient will given a dose of Thorazine and will be p.o. challenge.  Patient is feeling better and she is able to tolerate p.o., anticipate discharge home since her work-up is otherwise been reassuring.  Care transferred to Dr. Tyrone Nine while waiting for reassessment with worsening.  Anticipate discharge with prescription with outpatient follow-up.   Final Clinical Impressions(s) / ED Diagnoses   Final diagnoses:  Hiccups  Epigastric pain  Nausea and vomiting, intractability of vomiting not specified, unspecified vomiting type    ED Discharge Orders    None      Clinical Impression: 1. Hiccups   2. Epigastric pain   3. Nausea and vomiting, intractability of vomiting not specified, unspecified vomiting type     Disposition: Care transferred to Dr. Tyrone Nine while awaiting reassessment after Thorazine.  Anticipate discharge with prescription.  This note was prepared  with assistance of Systems analyst. Occasional wrong-word or sound-a-like substitutions may have  occurred due to the inherent limitations of voice recognition software.      Tegeler, Gwenyth Allegra, MD 12/28/18 [phone]

## 2018-12-28 NOTE — ED Notes (Signed)
Pt requesting additional nausea medication. MD made aware.

## 2018-12-28 NOTE — ED Notes (Signed)
Patient given chicken broth.

## 2018-12-28 NOTE — Discharge Instructions (Signed)
Try zantac or pepcid twice a day.  Try to avoid things that may make this worse, most commonly these are spicy foods tomato based products fatty foods chocolate and peppermint.  Alcohol and tobacco can also make this worse.  Return to the emergency department for sudden worsening pain fever or inability to eat or drink. ° °

## 2018-12-28 NOTE — ED Provider Notes (Signed)
I received the patient in signout from Dr. Sherry Ruffing, briefly patient is a 58 year old female with recurrent hiccups and nausea vomiting and cough.  Very thorough work-up was performed here with a CT scan of the abdomen pelvis that was negative for acute pathology.  Gallstones were noted.  She is feeling better and able to tolerate p.o. on my reassessment.  Plan was if she is tolerating p.o. the discharge her home.  We will give her a prescription for Thorazine Tessalon Perles and Zantac.  Given general surgery follow-up if this were to be symptomatic gallstones.  PCP follow-up as well.    Deno Etienne, DO 12/28/18 1650

## 2018-12-28 NOTE — ED Notes (Signed)
Pt reports that she has had hiccups 4 times while she has been here.

## 2018-12-28 NOTE — ED Triage Notes (Signed)
Pt reports that she still been having hiccups and vomiting everything she eats or drinks since was here on 23rd.

## 2018-12-29 LAB — URINE CULTURE

## 2018-12-31 ENCOUNTER — Encounter: Payer: Self-pay | Admitting: Family Medicine

## 2018-12-31 ENCOUNTER — Ambulatory Visit (INDEPENDENT_AMBULATORY_CARE_PROVIDER_SITE_OTHER): Payer: Self-pay | Admitting: Family Medicine

## 2018-12-31 VITALS — BP 98/60 | HR 60 | Ht 67.0 in | Wt 196.0 lb

## 2018-12-31 DIAGNOSIS — K8020 Calculus of gallbladder without cholecystitis without obstruction: Secondary | ICD-10-CM

## 2018-12-31 DIAGNOSIS — Z87891 Personal history of nicotine dependence: Secondary | ICD-10-CM | POA: Insufficient documentation

## 2018-12-31 DIAGNOSIS — R066 Hiccough: Secondary | ICD-10-CM

## 2018-12-31 DIAGNOSIS — Z7689 Persons encountering health services in other specified circumstances: Secondary | ICD-10-CM

## 2018-12-31 DIAGNOSIS — Z720 Tobacco use: Secondary | ICD-10-CM | POA: Insufficient documentation

## 2018-12-31 DIAGNOSIS — R1310 Dysphagia, unspecified: Secondary | ICD-10-CM | POA: Insufficient documentation

## 2018-12-31 DIAGNOSIS — Z8709 Personal history of other diseases of the respiratory system: Secondary | ICD-10-CM

## 2018-12-31 DIAGNOSIS — K219 Gastro-esophageal reflux disease without esophagitis: Secondary | ICD-10-CM

## 2018-12-31 HISTORY — DX: Dysphagia, unspecified: R13.10

## 2018-12-31 HISTORY — DX: Hiccough: R06.6

## 2018-12-31 NOTE — Patient Instructions (Signed)
Cholecystitis  Cholecystitis is irritation and swelling (inflammation) of the gallbladder. The gallbladder is an organ that is shaped like a pear. It is under the liver on the right side of the body. This organ stores bile. Bile helps the body break down (digest) the fats in food. This condition can occur all of a sudden. It needs to be treated. What are the causes? This condition may be caused by stones or lumps that form in the gallbladder (gallstones). Gallstones can block the tube (duct) that carries bile out of your gallbladder. Other causes are:  Damage to the gallbladder due to less blood flow.  Germs in the bile ducts.  Scars or kinks in the bile ducts.  Abnormal growths (tumors) in the liver, pancreas, or gallbladder. What increases the risk? You are more likely to develop this condition if:  You have sickle cell disease.  You take birth control pills.  You use estrogen.  You have alcoholic liver disease.  You have liver cirrhosis.  You are being fed through a vein.  You are very ill.  You do not eat or drink for a long time. This is also called "fasting."  You are overweight (obese).  You lose weight too fast.  You are pregnant.  You have high levels of fat in the blood (triglycerides).  You have irritation and swelling of the pancreas (pancreatitis). What are the signs or symptoms? Symptoms of this condition include:  Pain in the belly (abdomen). Pain is often in the upper right area of the belly.  Tenderness or bloating in the belly.  Feeling sick to your stomach (nauseous).  Throwing up (vomiting).  Fever.  Chills. How is this diagnosed? This condition may be diagnosed with a medical history and exam. You may also have other tests, such as:  Imaging tests. This may include: ? Ultrasound. ? CT scan of the belly. ? Nuclear scan. This is also called a HIDA scan. This scan lets your doctor see the bile as it moves in your body. ? MRI.  Blood  tests. These are done to check: ? Your blood count. The white blood cell count may be higher than normal. ? How well your liver works. How is this treated? This condition may be treated with:  Surgery to take out your gallbladder.  Antibiotic medicines to treat illnesses caused by germs.  Going without food for some time.  Giving fluids through an IV tube.  Medicines to treat pain or throwing up. Follow these instructions at home:  If you had surgery, follow instructions from your doctor about how to care for yourself after you go home. Medicines   Take over-the-counter and prescription medicines only as told by your doctor.  If you were prescribed an antibiotic medicine, take it as told by your doctor. Do not stop taking it even if you start to feel better. General instructions  Follow instructions from your doctor about what to eat or drink. Do not eat or drink anything that makes you sick again.  Do not lift anything that is heavier than 10 lb (4.5 kg) until your doctor says that it is safe.  Do not use any products that contain nicotine or tobacco, such as cigarettes and e-cigarettes. If you need help quitting, ask your doctor.  Keep all follow-up visits as told by your doctor. This is important. Contact a doctor if:  You have pain and your medicine does not help.  You have a fever. Get help right away if:  Your pain moves to: ? Another part of your belly. ? Your back.  Your symptoms do not go away.  You have new symptoms. Summary  Cholecystitis is swelling and irritation of the gallbladder.  This condition may be caused by stones or lumps that form in the gallbladder (gallstones).  Common symptoms are pain in the belly. You may feel sick to your stomach and start throwing up. You may also have a fever and chills.  This condition may be treated with surgery to take out the gallbladder. It may also be treated with medicines, fasting, and fluids through an IV  tube.  Follow what you are told about eating and drinking. Do not eat things that make you sick again. This information is not intended to replace advice given to you by your health care provider. Make sure you discuss any questions you have with your health care provider. Document Released: 09/29/2011 Document Revised: 02/16/2018 Document Reviewed: 02/16/2018 Elsevier Interactive Patient Education  2019 [place].  Gastroesophageal Reflux Disease, Adult Gastroesophageal reflux (GER) happens when acid from the stomach flows up into the tube that connects the mouth and the stomach (esophagus). Normally, food travels down the esophagus and stays in the stomach to be digested. With GER, food and stomach acid sometimes move back up into the esophagus. You may have a disease called gastroesophageal reflux disease (GERD) if the reflux:  Happens often.  Causes frequent or very bad symptoms.  Causes problems such as damage to the esophagus. When this happens, the esophagus becomes sore and swollen (inflamed). Over time, GERD can make small holes (ulcers) in the lining of the esophagus. What are the causes? This condition is caused by a problem with the muscle between the esophagus and the stomach. When this muscle is weak or not normal, it does not close properly to keep food and acid from coming back up from the stomach. The muscle can be weak because of:  Tobacco use.  Pregnancy.  Having a certain type of hernia (hiatal hernia).  Alcohol use.  Certain foods and drinks, such as coffee, chocolate, onions, and peppermint. What increases the risk? You are more likely to develop this condition if you:  Are overweight.  Have a disease that affects your connective tissue.  Use NSAID medicines. What are the signs or symptoms? Symptoms of this condition include:  Heartburn.  Difficult or painful swallowing.  The feeling of having a lump in the throat.  A bitter taste in the  mouth.  Bad breath.  Having a lot of saliva.  Having an upset or bloated stomach.  Belching.  Chest pain. Different conditions can cause chest pain. Make sure you see your doctor if you have chest pain.  Shortness of breath or noisy breathing (wheezing).  Ongoing (chronic) cough or a cough at night.  Wearing away of the surface of teeth (tooth enamel).  Weight loss. How is this treated? Treatment will depend on how bad your symptoms are. Your doctor may suggest:  Changes to your diet.  Medicine.  Surgery. Follow these instructions at home: Eating and drinking   Follow a diet as told by your doctor. You may need to avoid foods and drinks such as: ? Coffee and tea (with or without caffeine). ? Drinks that contain alcohol. ? Energy drinks and sports drinks. ? Bubbly (carbonated) drinks or sodas. ? Chocolate and cocoa. ? Peppermint and mint flavorings. ? Garlic and onions. ? Horseradish. ? Spicy and acidic foods. These include peppers, chili powder,  curry powder, vinegar, hot sauces, and BBQ sauce. ? Citrus fruit juices and citrus fruits, such as oranges, lemons, and limes. ? Tomato-based foods. These include red sauce, chili, salsa, and pizza with red sauce. ? Fried and fatty foods. These include donuts, french fries, potato chips, and high-fat dressings. ? High-fat meats. These include hot dogs, rib eye steak, sausage, ham, and bacon. ? High-fat dairy items, such as whole milk, butter, and cream cheese.  Eat small meals often. Avoid eating large meals.  Avoid drinking large amounts of liquid with your meals.  Avoid eating meals during the 2-3 hours before bedtime.  Avoid lying down right after you eat.  Do not exercise right after you eat. Lifestyle   Do not use any products that contain nicotine or tobacco. These include cigarettes, e-cigarettes, and chewing tobacco. If you need help quitting, ask your doctor.  Try to lower your stress. If you need help  doing this, ask your doctor.  If you are overweight, lose an amount of weight that is healthy for you. Ask your doctor about a safe weight loss goal. General instructions  Pay attention to any changes in your symptoms.  Take over-the-counter and prescription medicines only as told by your doctor. Do not take aspirin, ibuprofen, or other NSAIDs unless your doctor says it is okay.  Wear loose clothes. Do not wear anything tight around your waist.  Raise (elevate) the head of your bed about 6 inches (15 cm).  Avoid bending over if this makes your symptoms worse.  Keep all follow-up visits as told by your doctor. This is important. Contact a doctor if:  You have new symptoms.  You lose weight and you do not know why.  You have trouble swallowing or it hurts to swallow.  You have wheezing or a cough that keeps happening.  Your symptoms do not get better with treatment.  You have a hoarse voice. Get help right away if:  You have pain in your arms, neck, jaw, teeth, or back.  You feel sweaty, dizzy, or light-headed.  You have chest pain or shortness of breath.  You throw up (vomit) and your throw-up looks like blood or coffee grounds.  You pass out (faint).  Your poop (stool) is bloody or black.  You cannot swallow, drink, or eat. Summary  If a person has gastroesophageal reflux disease (GERD), food and stomach acid move back up into the esophagus and cause symptoms or problems such as damage to the esophagus.  Treatment will depend on how bad your symptoms are.  Follow a diet as told by your doctor.  Take all medicines only as told by your doctor. This information is not intended to replace advice given to you by your health care provider. Make sure you discuss any questions you have with your health care provider. Document Released: 03/28/2008 Document Revised: 04/18/2018 Document Reviewed: 04/18/2018 Elsevier Interactive Patient Education  2019 [employer].  Diverticulosis  Diverticulosis is a condition that develops when small pouches (diverticula) form in the wall of the large intestine (colon). The colon is where water is absorbed and stool is formed. The pouches form when the inside layer of the colon pushes through weak spots in the outer layers of the colon. You may have a few pouches or many of them. What are the causes? The cause of this condition is not known. What increases the risk? The following factors may make you more likely to develop this condition:  Being older than age  24. Your risk for this condition increases with age. Diverticulosis is rare among people younger than age 42. By age 55, many people have it.  Eating a low-fiber diet.  Having frequent constipation.  Being overweight.  Not getting enough exercise.  Smoking.  Taking over-the-counter pain medicines, like aspirin and ibuprofen.  Having a family history of diverticulosis. What are the signs or symptoms? In most people, there are no symptoms of this condition. If you do have symptoms, they may include:  Bloating.  Cramps in the abdomen.  Constipation or diarrhea.  Pain in the lower left side of the abdomen. How is this diagnosed? This condition is most often diagnosed during an exam for other colon problems. Because diverticulosis usually has no symptoms, it often cannot be diagnosed independently. This condition may be diagnosed by:  Using a flexible scope to examine the colon (colonoscopy).  Taking an X-ray of the colon after dye has been put into the colon (barium enema).  Doing a CT scan. How is this treated? You may not need treatment for this condition if you have never developed an infection related to diverticulosis. If you have had an infection before, treatment may include:  Eating a high-fiber diet. This may include eating more fruits, vegetables, and grains.  Taking a fiber supplement.  Taking a live bacteria supplement  (probiotic).  Taking medicine to relax your colon.  Taking antibiotic medicines. Follow these instructions at home:  Drink 6-8 glasses of water or more each day to prevent constipation.  Try not to strain when you have a bowel movement.  If you have had an infection before: ? Eat more fiber as directed by your health care provider or your diet and nutrition specialist (dietitian). ? Take a fiber supplement or probiotic, if your health care provider approves.  Take over-the-counter and prescription medicines only as told by your health care provider.  If you were prescribed an antibiotic, take it as told by your health care provider. Do not stop taking the antibiotic even if you start to feel better.  Keep all follow-up visits as told by your health care provider. This is important. Contact a health care provider if:  You have pain in your abdomen.  You have bloating.  You have cramps.  You have not had a bowel movement in 3 days. Get help right away if:  Your pain gets worse.  Your bloating becomes very bad.  You have a fever or chills, and your symptoms suddenly get worse.  You vomit.  You have bowel movements that are bloody or black.  You have bleeding from your rectum. Summary  Diverticulosis is a condition that develops when small pouches (diverticula) form in the wall of the large intestine (colon).  You may have a few pouches or many of them.  This condition is most often diagnosed during an exam for other colon problems.  If you have had an infection related to diverticulosis, treatment may include increasing the fiber in your diet, taking supplements, or taking medicines. This information is not intended to replace advice given to you by your health care provider. Make sure you discuss any questions you have with your health care provider. Document Released: 07/07/2004 Document Revised: 08/29/2016 Document Reviewed: 08/29/2016 Elsevier Interactive Patient  Education  2019 [employer].

## 2018-12-31 NOTE — Progress Notes (Signed)
Patient presents to clinic today to establish care.  SUBJECTIVE: PMH:  Pt is a 58 yo female with pmh sig for GERD, hiccups, tobacco use. Pt previously seen at [facility].  Tobacco use: -pt quit smoking 12/10/18 -used the patch -was smoking less than 10 cigs per day  History of asthma: -Endorsed frequent symptoms as a child -States "outgrew it" -Endorses occasional wheezing for which she uses albuterol inhaler GI issues: -seen by the ED multiple times -endorses hiccups, gerd, nausea, feeling like food comes back up -extensive workup largely negative. -CT with cholelithiasis and diverticulosis. Pt asymptomatic -notes hiccups started 12/09/2018 -Pt tried Protonix, OTC medications including Tums, Ranantidine -Notes symptoms with spicy foods, but now water is causing problems  Allergies: NKDA  Past surgical history: Hysterectomy 2008 Tonsillectomy  Social history: Patient is single.  She currently works at [employer] in [place].  Patient is a former smoker.  She quit 12/10/2018.   Health Maintenance: Colonoscopy --2014 or 2015   Past Medical History:  Diagnosis Date  . Allergy    uses Albuterol inhaler during allergy season  . Diverticulosis 2013   Colonoscopy  . GERD (gastroesophageal reflux disease)   . Hiatal hernia    CT abdomen in August 2013  . Hx of colonic polyps 2013   Colonoscopy   . Osteoarthritis    b/l hip, seen on CT abd in 05/2012    Past Surgical History:  Procedure Laterality Date  . TONSILLECTOMY     childhood  . TOTAL ABDOMINAL HYSTERECTOMY W/ BILATERAL SALPINGOOPHORECTOMY  2008   Fibroids, done by Dr. Clovia Cuff at [facility]    Current Outpatient Medications on File Prior to Visit  Medication Sig Dispense Refill  . albuterol (PROVENTIL HFA;VENTOLIN HFA) 108 (90 BASE) MCG/ACT inhaler Inhale 2 puffs into the lungs every 6 (six) hours as needed. For shortness of breath    . aspirin 81 MG  chewable tablet Chew 81 mg by mouth daily.    . benzonatate (TESSALON) 100 MG capsule Take 1 capsule (100 mg total) by mouth every 8 (eight) hours. 21 capsule 0  . chlorproMAZINE (THORAZINE) 25 MG tablet Take 1 tablet (25 mg total) by mouth 3 (three) times daily. 30 tablet 0  . ibuprofen (ADVIL,MOTRIN) 200 MG tablet Take 800 mg by mouth every 6 (six) hours as needed for headache or moderate pain.    Marland Kitchen metoCLOPramide (REGLAN) 10 MG tablet Take 1 tablet (10 mg total) by mouth every 6 (six) hours as needed for nausea (or hiccups). 30 tablet 0  . Multiple Vitamin (MULTIVITAMIN WITH MINERALS) TABS tablet Take 1 tablet by mouth daily.    . ranitidine (ZANTAC) 150 MG tablet Take 1 tablet (150 mg total) by mouth 2 (two) times daily. 60 tablet 0   No current facility-administered medications on file prior to visit.     No Known Allergies  Family History  Problem Relation Age of Onset  . Stroke Father        passed away at 64 yo of cerebal brain hemorrhage   . Hypertension Mother   . Hyperlipidemia Mother   . Colon cancer Neg Hx   . Stomach cancer Neg Hx   . Colon polyps Neg Hx   . Rectal cancer Neg Hx     Social History   Socioeconomic History  . Marital status: Single    Spouse name: Not on file  . Number of children: 3  . Years of education:  12th  . Highest education level: Not on file  Occupational History  . Occupation: ASS. CATHETERS    Employer: [employer]    Comment: layed off  Social Needs  . Financial resource strain: Not on file  . Food insecurity:    Worry: Not on file    Inability: Not on file  . Transportation needs:    Medical: Not on file    Non-medical: Not on file  Tobacco Use  . Smoking status: Current Some Day Smoker    Packs/day: 0.50    Years: 31.00    Pack years: 15.50    Types: Cigarettes  . Smokeless tobacco: Never Used  Substance and Sexual Activity  . Alcohol use: Yes    Alcohol/week: 2.0 standard drinks    Types: 2 Glasses of wine per week     Comment: 1-2 beers/week  . Drug use: No  . Sexual activity: Yes    Partners: Male    Comment: Same partner for 11 years  Lifestyle  . Physical activity:    Days per week: Not on file    Minutes per session: Not on file  . Stress: Not on file  Relationships  . Social connections:    Talks on phone: Not on file    Gets together: Not on file    Attends religious service: Not on file    Active member of club or organization: Not on file    Attends meetings of clubs or organizations: Not on file    Relationship status: Not on file  . Intimate partner violence:    Fear of current or ex partner: Not on file    Emotionally abused: Not on file    Physically abused: Not on file    Forced sexual activity: Not on file  Other Topics Concern  . Not on file  Social History Narrative   Live with her mother   Has three kids - all grown - all in [place] area   4 grandchildren   [place] at home - 11 years    ROS General: Denies fever, chills, night sweats, changes in weight, changes in appetite HEENT: Denies headaches, ear pain, changes in vision, rhinorrhea, sore throat CV: Denies CP, palpitations, SOB, orthopnea Pulm: Denies SOB, cough, wheezing GI: Denies abdominal pain, nausea, vomiting, diarrhea, constipation  + reflux, hiccups, "feeling food getting stuck" GU: Denies dysuria, hematuria, frequency, vaginal discharge Msk: Denies muscle cramps, joint pains Neuro: Denies weakness, numbness, tingling Skin: Denies rashes, bruising Psych: Denies depression, anxiety, hallucinations   BP 98/60 (BP Location: Right Arm, Patient Position: Sitting, Cuff Size: Large)   Pulse 60   Ht 5\' 7"  (1.702 m)   Wt 196 lb (88.9 kg)   SpO2 98%   BMI 30.70 kg/m   Physical Exam Gen. Pleasant, well developed, well-nourished, in NAD HEENT - [place]/AT, PERRL, EOMI, no scleral icterus, no nasal drainage, pharynx without erythema or exudate.  TMs normal bilaterally. Lungs: no use of accessory muscles,  CTAB, no wheezes, rales or rhonchi Cardiovascular: RRR, No r/g/m, no peripheral edema Abdomen: BS present, soft, nontender,nondistended Musculoskeletal: No deformities, moves all four extremities, no cyanosis or clubbing, normal tone Neuro:  A&Ox3, CN II-XII intact, normal gait Skin:  Warm, dry, intact, no lesions  Recent Results (from the past 2160 hour(s))  Lipase, blood     Status: None   Collection Time: 12/16/18 10:46 PM  Result Value Ref Range   Lipase 51 11 - 51 U/L    Comment: Performed  at [facility], [place] [address], [place], [place] [address]  Comprehensive metabolic panel     Status: Abnormal   Collection Time: 12/16/18 10:46 PM  Result Value Ref Range   Sodium 139 135 - 145 mmol/L   Potassium 3.5 3.5 - 5.1 mmol/L   Chloride 101 98 - 111 mmol/L   CO2 31 22 - 32 mmol/L   Glucose, Bld 108 (H) 70 - 99 mg/dL   BUN 17 6 - 20 mg/dL   Creatinine, Ser 0.79 0.44 - 1.00 mg/dL   Calcium 9.3 8.9 - 10.3 mg/dL   Total Protein 6.8 6.5 - 8.1 g/dL   Albumin 4.0 3.5 - 5.0 g/dL   AST 15 15 - 41 U/L   ALT 14 0 - 44 U/L   Alkaline Phosphatase 82 38 - 126 U/L   Total Bilirubin 0.6 0.3 - 1.2 mg/dL   GFR calc non Af Amer >60 >60 mL/min   GFR calc Af Amer >60 >60 mL/min   Anion gap 7 5 - 15    Comment: Performed at [facility], [place] [address], [place], [place] [address]  CBC     Status: None   Collection Time: 12/16/18 10:46 PM  Result Value Ref Range   WBC 9.8 4.0 - 10.5 K/uL   RBC 4.48 3.87 - 5.11 MIL/uL   Hemoglobin 13.3 12.0 - 15.0 g/dL   HCT 40.9 36.0 - 46.0 %   MCV 91.3 80.0 - 100.0 fL   MCH 29.7 26.0 - 34.0 pg   MCHC 32.5 30.0 - 36.0 g/dL   RDW 12.4 11.5 - 15.5 %   Platelets 246 150 - 400 K/uL   nRBC 0.0 0.0 - 0.2 %    Comment: Performed at [facility], [place] [address] [place], [place] [address]  I-Stat beta hCG blood, ED     Status: None   Collection Time: 12/16/18 10:51 PM  Result Value Ref Range   I-stat hCG,  quantitative <5.0 <5 mIU/mL   Comment 3            Comment:   GEST. AGE      CONC.  (mIU/mL)   <=1 WEEK        5 - 50     2 WEEKS       50 - 500     3 WEEKS       100 - 10,000     4 WEEKS     1,000 - 30,000        FEMALE AND NON-PREGNANT FEMALE:     LESS THAN 5 mIU/mL   Urinalysis, Routine w reflex microscopic     Status: Abnormal   Collection Time: 12/17/18  1:00 AM  Result Value Ref Range   Color, Urine YELLOW YELLOW   APPearance HAZY (A) CLEAR   Specific Gravity, Urine 1.023 1.005 - 1.030   pH 5.0 5.0 - 8.0   Glucose, UA NEGATIVE NEGATIVE mg/dL   Hgb urine dipstick NEGATIVE NEGATIVE   Bilirubin Urine NEGATIVE NEGATIVE   Ketones, ur NEGATIVE NEGATIVE mg/dL   Protein, ur NEGATIVE NEGATIVE mg/dL   Nitrite NEGATIVE NEGATIVE   Leukocytes,Ua NEGATIVE NEGATIVE    Comment: Performed at [place] [address] [place], [place] [address]  Lactic acid, plasma     Status: None   Collection Time: 12/28/18 12:36 PM  Result Value Ref Range   Lactic Acid, Venous 1.8 0.5 - 1.9 mmol/L  Comment: Performed at [facility], [place] [address] [place], [place] [address]  I-stat troponin, ED     Status: None   Collection Time: 12/28/18 12:39 PM  Result Value Ref Range   Troponin i, poc 0.00 0.00 - 0.08 ng/mL   Comment 3            Comment: Due to the release kinetics of cTnI, a negative result within the first hours of the onset of symptoms does not rule out myocardial infarction with certainty. If myocardial infarction is still suspected, repeat the test at appropriate intervals.   TSH     Status: None   Collection Time: 12/28/18 12:41 PM  Result Value Ref Range   TSH 0.472 0.350 - 4.500 uIU/mL    Comment: Performed by a 3rd Generation assay with a functional sensitivity of <=0.01 uIU/mL. Performed at [facility], [place] [address] [place], [place] [address]   Magnesium     Status: Abnormal   Collection Time: 12/28/18 12:41 PM   Result Value Ref Range   Magnesium 2.5 (H) 1.7 - 2.4 mg/dL    Comment: Performed at [facility], [place] [address], [place], [place] [address]  CBC with Differential     Status: None   Collection Time: 12/28/18 12:41 PM  Result Value Ref Range   WBC 9.1 4.0 - 10.5 K/uL   RBC 4.93 3.87 - 5.11 MIL/uL   Hemoglobin 14.3 12.0 - 15.0 g/dL   HCT 44.9 36.0 - 46.0 %   MCV 91.1 80.0 - 100.0 fL   MCH 29.0 26.0 - 34.0 pg   MCHC 31.8 30.0 - 36.0 g/dL   RDW 12.4 11.5 - 15.5 %   Platelets 268 150 - 400 K/uL   nRBC 0.0 0.0 - 0.2 %   Neutrophils Relative % 75 %   Neutro Abs 6.8 1.7 - 7.7 K/uL   Lymphocytes Relative 16 %   Lymphs Abs 1.4 0.7 - 4.0 K/uL   Monocytes Relative 7 %   Monocytes Absolute 0.6 0.1 - 1.0 K/uL   Eosinophils Relative 1 %   Eosinophils Absolute 0.1 0.0 - 0.5 K/uL   Basophils Relative 1 %   Basophils Absolute 0.1 0.0 - 0.1 K/uL   Immature Granulocytes 0 %   Abs Immature Granulocytes 0.02 0.00 - 0.07 K/uL    Comment: Performed at [facility], [place] [address] [place] [address], [place], [place] [address]  Comprehensive metabolic panel     Status: Abnormal   Collection Time: 12/28/18 12:41 PM  Result Value Ref Range   Sodium 137 135 - 145 mmol/L   Potassium 3.8 3.5 - 5.1 mmol/L   Chloride 103 98 - 111 mmol/L   CO2 26 22 - 32 mmol/L   Glucose, Bld 105 (H) 70 - 99 mg/dL   BUN 13 6 - 20 mg/dL   Creatinine, Ser 0.84 0.44 - 1.00 mg/dL   Calcium 9.8 8.9 - 10.3 mg/dL   Total Protein 7.3 6.5 - 8.1 g/dL   Albumin 4.3 3.5 - 5.0 g/dL   AST 16 15 - 41 U/L   ALT 16 0 - 44 U/L   Alkaline Phosphatase 88 38 - 126 U/L   Total Bilirubin 1.1 0.3 - 1.2 mg/dL   GFR calc non Af Amer >60 >60 mL/min   GFR calc Af Amer >60 >60 mL/min   Anion gap 8 5 - 15    Comment: Performed at [facility], [place] [address]  [place], [place] [address]  Lipase, blood     Status: None   Collection Time: 12/28/18 12:41 PM  Result Value Ref Range   Lipase 48 11 - 51 U/L      Comment: Performed at [facility], [place] [address] [place], [place] [address]  Urinalysis, Routine w reflex microscopic     Status: Abnormal   Collection Time: 12/28/18  2:17 PM  Result Value Ref Range   Color, Urine YELLOW YELLOW   APPearance HAZY (A) CLEAR   Specific Gravity, Urine 1.031 (H) 1.005 - 1.030   pH 9.0 (H) 5.0 - 8.0   Glucose, UA NEGATIVE NEGATIVE mg/dL   Hgb urine dipstick NEGATIVE NEGATIVE   Bilirubin Urine NEGATIVE NEGATIVE   Ketones, ur 5 (A) NEGATIVE mg/dL   Protein, ur NEGATIVE NEGATIVE mg/dL   Nitrite NEGATIVE NEGATIVE   Leukocytes,Ua NEGATIVE NEGATIVE    Comment: Performed at [place] [address], [place], [place] [address]  Urine culture     Status: None   Collection Time: 12/28/18  2:17 PM  Result Value Ref Range   Specimen Description      URINE, RANDOM Performed at [facility], [place] [address], [place], [place] [address]    Special Requests      NONE Performed at [facility], [place] [address], [place],  [address]    Culture      Multiple bacterial morphotypes present, none predominant. Suggest appropriate recollection if clinically indicated.   Report Status 12/29/2018 FINAL   I-stat troponin, ED     Status: None   Collection Time: 12/28/18  3:24 PM  Result Value Ref Range   Troponin i, poc 0.00 0.00 - 0.08 ng/mL   Comment 3            Comment: Due to the release kinetics of cTnI, a negative result within the first hours of the onset of symptoms does not rule out myocardial infarction with certainty. If myocardial infarction is still suspected, repeat the test at appropriate intervals.     Assessment/Plan: Gastroesophageal reflux disease, esophagitis presence not specified -Encouraged avoids foods known to cause symptoms -Continue Zantac daily -Given handout - Plan: Ambulatory referral to Gastroenterology  Dysphagia, unspecified type - Plan:  Ambulatory referral to Gastroenterology  Hiccups  -Continue Thorazine as needed - Plan: Ambulatory referral to Gastroenterology  History of nicotine dependence -Smoking cessation counseling greater than 3 minutes, less than 10 minutes -Quit date 12/10/2018 using patches -Patient congratulated  History of asthma -Stable -Continue albuterol inhaler as needed  Calculus of gallbladder without cholecystitis without obstruction -Incidental finding seen on CT -We will continue to monitor as currently asymptomatic  Encounter to establish care -We reviewed the PMH, PSH, FH, SH, Meds and Allergies. -We provided refills for any medications we will prescribe as needed. -We addressed current concerns per orders and patient instructions. -We have asked for records for pertinent exams, studies, vaccines and notes from previous providers. -We have advised patient to follow up per instructions below.  F/u PRN in the next  Grier Mitts, MD

## 2019-01-07 ENCOUNTER — Ambulatory Visit: Payer: Self-pay | Admitting: Gastroenterology

## 2019-01-09 NOTE — Progress Notes (Signed)
Patient ID: [name], female   DOB: Aug 10, 1961, 58 y.o.   MRN: [id number]    [name], is a 58 y.o. female  [id number]  GYI:[id number]  DOB - 09-28-1961  Subjective:  Chief Complaint and HPI: [name] is a 58 y.o. female here today to establish care and for a follow up visit After being seen in the ED 12/28/2018 for hiccups and nausea/vomiting.  She is unable to keep much of anything down, even liquids.  Says she has been vomiting some "green stuff." feels awful and weak.  Some abdominal cramping.   Has gone from 200 pounds on 12/16/2018 to 183 today.  No fever.   From ED note: [name] is a 58 y.o. female with a past medical history significant for GERD, diverticulosis, hiatal hernia who presents for nausea, vomiting, abdominal discomfort, constipation, decreased flatus, lightheadedness, fatigue, and hiccups.  Patient reports that 3 weeks ago she was seen in the emergency department for similar symptoms with primarily nausea, vomiting, hiccuping and had a reassuring exam and work-up and was given Reglan and fluids and was able to go home.  She reports that after leaving she had return of the hiccuping and she has not had any relief in symptoms in the last 3 weeks.  She reports that she has had worsening abdominal pain over the last few days and has not had a bowel movement in the last 5 days.  She reports her nausea and vomiting is preventing her from staying hydrated with eating and drinking.  She reports she is having decreased flatus.  She reports he is doing very fatigued and tired.  She denies chest pain, palpitations or shortness of breath.  She denies any fevers, congestion, or cough.  She has been feeling like she cannot get warm.  She reports abdominal pain is moderate to severe in her upper abdomen.  No lower abdominal pain.  No diarrhea.  She does report a decreased urination.  No significant back pain headaches, or neck stiffness.  No recent trauma.  On arrival,  patient's heart rate was found to be in the low 40s.  It appeared to be a sinus rhythm.  Oxygen saturations were normal on room air.  Patient was not hypotensive.  She was afebrile.  On exam, patient's lungs were clear and chest was nontender.  Patient did have upper abdominal tenderness.  No flank tenderness or lower abdominal tenderness.  No CVA tenderness.  Patient had no significant lower extremity edema.  Normal pulses in extremities.  No focal neurologic deficits on initial exam.  Clinically I am concerned that patient symptoms Cheryl be related to diaphragmatic irritation from her hiatal hernia versus diverticulitis or other intra-abdominal pathology.  I am also concerned about the patient having new hypothyroidism with her inability to get warm, bradycardia, fatigue, and some GI symptoms.  Patient will have screening laboratory testing as well as imaging to look for obstruction or other abnormality in her abdomen.  She will be given fluids and nausea medicine and monitor.  Anticipate reassessment for work-up.  4:08 PM Patient's work-up was overall reassuring.  Patient had normal TSH.  Metabolic panel and lipase were normal.  Troponin normal.  Urinalysis showed no infection.  Mild ketones likely negative mild dehydration.  Magnesium just barely elevated.  No anemia.  CT scan showed no acute abnormalities.  Patient does have gallstones but no evidence of acute cholecystitis.  Patient reports he still having hiccups and some nausea after Zofran.  Patient  will given a dose of Thorazine and will be p.o. challenge.  Patient is feeling better and she is able to tolerate p.o., anticipate discharge home since her work-up is otherwise been reassuring.  Care transferred to Dr. Tyrone Nine while waiting for reassessment with worsening.  Anticipate discharge with prescription with outpatient follow-up.  ED/Hospital notes reviewed and summarized above  ROS:   Constitutional:  No f/c, No night sweats.  EENT:  No vision changes, No blurry vision, No hearing changes. No mouth, throat, or ear problems.  Respiratory: No cough, No SOB Cardiac: No CP, no palpitations GI:  + abd pain, + N/V no D. GU: No Urinary s/sx Musculoskeletal: +body aches Neuro: No headache, no dizziness, no motor weakness.  Skin: No rash Endocrine:  No polydipsia. No polyuria.  Psych: Denies SI/HI  No problems updated.  ALLERGIES: No Known Allergies  PAST MEDICAL HISTORY: Past Medical History:  Diagnosis Date  . Allergy    uses Albuterol inhaler during allergy season  . Diverticulosis 2013   Colonoscopy  . GERD (gastroesophageal reflux disease)   . Hiatal hernia    CT abdomen in August 2013  . Hx of colonic polyps 2013   Colonoscopy   . Osteoarthritis    b/l hip, seen on CT abd in 05/2012    MEDICATIONS AT HOME: Prior to Admission medications   Medication Sig Start Date End Date Taking? Authorizing Provider  albuterol (PROVENTIL HFA;VENTOLIN HFA) 108 (90 BASE) MCG/ACT inhaler Inhale 2 puffs into the lungs every 6 (six) hours as needed. For shortness of breath   Yes [provider]  aspirin 81 MG chewable tablet Chew 81 mg by mouth daily.   Yes [provider]  benzonatate (TESSALON) 100 MG capsule Take 1 capsule (100 mg total) by mouth every 8 (eight) hours. 12/28/18  Yes Deno Etienne, DO  ibuprofen (ADVIL,MOTRIN) 200 MG tablet Take 800 mg by mouth every 6 (six) hours as needed for headache or moderate pain.   Yes [provider]  metoCLOPramide (REGLAN) 10 MG tablet Take 1 tablet (10 mg total) by mouth every 6 (six) hours as needed for nausea (or hiccups). 3/41/93  Yes Delora Fuel, MD  Multiple Vitamin (MULTIVITAMIN WITH MINERALS) TABS tablet Take 1 tablet by mouth daily.   Yes [provider]  ranitidine (ZANTAC) 150 MG tablet Take 1 tablet (150 mg total) by mouth 2 (two) times daily. 12/28/18  Yes Deno Etienne, DO  chlorproMAZINE (THORAZINE) 25 MG tablet Take 1 tablet (25 mg  total) by mouth 3 (three) times daily. Patient not taking: Reported on 01/10/2019 12/28/18   Deno Etienne, DO     Objective:  EXAM:   Vitals:   01/10/19 1508  BP: (!) 96/57  Pulse: 89  Temp: 98.9 F (37.2 C)  TempSrc: Oral  SpO2: 96%  Weight: 183 lb (83 kg)  Height: 5\' 7"  (1.702 m)    General appearance : A&OX3. NAD. Non-toxic-appearing, but does appear weak and ill and is requiring a wheel chair which is not typical for her.  Also, holding a vomit bag HEENT: Atraumatic and Normocephalic.  PERRLA. EOM intact.   Neck: supple, no JVD. No cervical lymphadenopathy. No thyromegaly Chest/Lungs:  Breathing-non-labored, Good air entry bilaterally, breath sounds normal without rales, rhonchi, or wheezing  CVS: S1 S2 regular, no murmurs, gallops, rubs  Abdomen: Bowel sounds present, Non tender and not distended with no gaurding, rigidity or rebound. Extremities: Bilateral Lower Ext shows no edema, both legs are warm to touch with =  pulse throughout Neurology:  CN II-XII grossly intact, Non focal. Psych:  TP linear. J/I WNL. Normal speech. Appropriate eye contact and affect.  Skin:  No Rash  Data Review No results found for: HGBA1C   Assessment & Plan   1. Weight loss Unexplained and unable to keep anything down.  Needs further w/up and IVF.  Needs higher level of care than can be offered in our office.  To ED.  Family members with her and will take her straight to ED.    2. Nausea and vomiting, intractability of vomiting not specified, unspecified vomiting type See #1  3. Dehydration To ED;  See #1  4. Encounter for examination following treatment at hospital See #1     Patient have been counseled extensively about nutrition and exercise  Return in about 3 weeks (around 01/31/2019) for assign PCP; f/up.  The patient was given clear instructions to go to ER or return to medical center if symptoms don't improve, worsen or new problems develop. The patient verbalized  understanding. The patient was told to call to get lab results if they haven't heard anything in the next week.     Freeman Caldron, PA-C [facility] and [place], [place]   01/10/2019, 4:05 PM

## 2019-01-10 ENCOUNTER — Other Ambulatory Visit: Payer: Self-pay

## 2019-01-10 ENCOUNTER — Inpatient Hospital Stay (HOSPITAL_COMMUNITY)
Admission: EM | Admit: 2019-01-10 | Discharge: 2019-01-13 | DRG: 391 | Disposition: A | Discharge status: Home / Self Care | Payer: Self-pay | Attending: Internal Medicine | Admitting: Internal Medicine

## 2019-01-10 ENCOUNTER — Ambulatory Visit: Payer: Self-pay | Attending: Family Medicine | Admitting: Physician Assistant

## 2019-01-10 ENCOUNTER — Emergency Department (HOSPITAL_COMMUNITY): Payer: Self-pay

## 2019-01-10 ENCOUNTER — Encounter (HOSPITAL_COMMUNITY): Payer: Self-pay | Admitting: Emergency Medicine

## 2019-01-10 VITALS — BP 96/57 | HR 89 | Temp 98.9°F | Ht 67.0 in | Wt 183.0 lb

## 2019-01-10 DIAGNOSIS — E860 Dehydration: Secondary | ICD-10-CM

## 2019-01-10 DIAGNOSIS — R112 Nausea with vomiting, unspecified: Secondary | ICD-10-CM

## 2019-01-10 DIAGNOSIS — R1032 Left lower quadrant pain: Secondary | ICD-10-CM

## 2019-01-10 DIAGNOSIS — Z79899 Other long term (current) drug therapy: Secondary | ICD-10-CM

## 2019-01-10 DIAGNOSIS — E785 Hyperlipidemia, unspecified: Secondary | ICD-10-CM | POA: Diagnosis present

## 2019-01-10 DIAGNOSIS — Z91048 Other nonmedicinal substance allergy status: Secondary | ICD-10-CM

## 2019-01-10 DIAGNOSIS — K8020 Calculus of gallbladder without cholecystitis without obstruction: Secondary | ICD-10-CM | POA: Diagnosis present

## 2019-01-10 DIAGNOSIS — R001 Bradycardia, unspecified: Secondary | ICD-10-CM | POA: Diagnosis present

## 2019-01-10 DIAGNOSIS — N179 Acute kidney failure, unspecified: Secondary | ICD-10-CM | POA: Diagnosis present

## 2019-01-10 DIAGNOSIS — R739 Hyperglycemia, unspecified: Secondary | ICD-10-CM | POA: Diagnosis present

## 2019-01-10 DIAGNOSIS — K449 Diaphragmatic hernia without obstruction or gangrene: Secondary | ICD-10-CM | POA: Diagnosis present

## 2019-01-10 DIAGNOSIS — K219 Gastro-esophageal reflux disease without esophagitis: Secondary | ICD-10-CM | POA: Diagnosis present

## 2019-01-10 DIAGNOSIS — R1310 Dysphagia, unspecified: Secondary | ICD-10-CM

## 2019-01-10 DIAGNOSIS — K224 Dyskinesia of esophagus: Secondary | ICD-10-CM | POA: Diagnosis present

## 2019-01-10 DIAGNOSIS — Z7982 Long term (current) use of aspirin: Secondary | ICD-10-CM

## 2019-01-10 DIAGNOSIS — R002 Palpitations: Secondary | ICD-10-CM | POA: Diagnosis present

## 2019-01-10 DIAGNOSIS — K2990 Gastroduodenitis, unspecified, without bleeding: Principal | ICD-10-CM | POA: Diagnosis present

## 2019-01-10 DIAGNOSIS — K2980 Duodenitis without bleeding: Secondary | ICD-10-CM | POA: Diagnosis present

## 2019-01-10 DIAGNOSIS — Z6828 Body mass index (BMI) 28.0-28.9, adult: Secondary | ICD-10-CM

## 2019-01-10 DIAGNOSIS — Z09 Encounter for follow-up examination after completed treatment for conditions other than malignant neoplasm: Secondary | ICD-10-CM

## 2019-01-10 DIAGNOSIS — K5730 Diverticulosis of large intestine without perforation or abscess without bleeding: Secondary | ICD-10-CM | POA: Diagnosis present

## 2019-01-10 DIAGNOSIS — R634 Abnormal weight loss: Secondary | ICD-10-CM

## 2019-01-10 DIAGNOSIS — Z87891 Personal history of nicotine dependence: Secondary | ICD-10-CM

## 2019-01-10 DIAGNOSIS — K5900 Constipation, unspecified: Secondary | ICD-10-CM | POA: Diagnosis present

## 2019-01-10 DIAGNOSIS — E43 Unspecified severe protein-calorie malnutrition: Secondary | ICD-10-CM | POA: Diagnosis present

## 2019-01-10 DIAGNOSIS — R066 Hiccough: Secondary | ICD-10-CM | POA: Diagnosis present

## 2019-01-10 DIAGNOSIS — Z86010 Personal history of colonic polyps: Secondary | ICD-10-CM

## 2019-01-10 HISTORY — DX: Dehydration: E86.0

## 2019-01-10 HISTORY — DX: Dysphagia, unspecified: R13.10

## 2019-01-10 HISTORY — DX: Left lower quadrant pain: R10.32

## 2019-01-10 LAB — CBC WITH DIFFERENTIAL/PLATELET
Abs Immature Granulocytes: 0.04 10*3/uL (ref 0.00–0.07)
Basophils Absolute: 0.1 10*3/uL (ref 0.0–0.1)
Basophils Relative: 1 %
Eosinophils Absolute: 0.1 10*3/uL (ref 0.0–0.5)
Eosinophils Relative: 2 %
HCT: 46.7 % — ABNORMAL HIGH (ref 36.0–46.0)
Hemoglobin: 16.1 g/dL — ABNORMAL HIGH (ref 12.0–15.0)
Immature Granulocytes: 0 %
Lymphocytes Relative: 21 %
Lymphs Abs: 1.9 10*3/uL (ref 0.7–4.0)
MCH: 29.2 pg (ref 26.0–34.0)
MCHC: 34.5 g/dL (ref 30.0–36.0)
MCV: 84.6 fL (ref 80.0–100.0)
Monocytes Absolute: 0.8 10*3/uL (ref 0.1–1.0)
Monocytes Relative: 8 %
NRBC: 0 % (ref 0.0–0.2)
Neutro Abs: 6.5 10*3/uL (ref 1.7–7.7)
Neutrophils Relative %: 68 %
Platelets: 282 10*3/uL (ref 150–400)
RBC: 5.52 MIL/uL — AB (ref 3.87–5.11)
RDW: 12.1 % (ref 11.5–15.5)
WBC: 9.5 10*3/uL (ref 4.0–10.5)

## 2019-01-10 LAB — COMPREHENSIVE METABOLIC PANEL
ALT: 35 U/L (ref 0–44)
AST: 23 U/L (ref 15–41)
Albumin: 4.4 g/dL (ref 3.5–5.0)
Alkaline Phosphatase: 98 U/L (ref 38–126)
Anion gap: 16 — ABNORMAL HIGH (ref 5–15)
BUN: 23 mg/dL — ABNORMAL HIGH (ref 6–20)
CO2: 18 mmol/L — ABNORMAL LOW (ref 22–32)
Calcium: 10.6 mg/dL — ABNORMAL HIGH (ref 8.9–10.3)
Chloride: 103 mmol/L (ref 98–111)
Creatinine, Ser: 1.07 mg/dL — ABNORMAL HIGH (ref 0.44–1.00)
GFR calc non Af Amer: 58 mL/min — ABNORMAL LOW (ref >60)
Glucose, Bld: 109 mg/dL — ABNORMAL HIGH (ref 70–99)
POTASSIUM: 3.6 mmol/L (ref 3.5–5.1)
Sodium: 137 mmol/L (ref 135–145)
Total Bilirubin: 1.5 mg/dL — ABNORMAL HIGH (ref 0.3–1.2)
Total Protein: 7.4 g/dL (ref 6.5–8.1)

## 2019-01-10 LAB — LIPASE, BLOOD: Lipase: 82 U/L — ABNORMAL HIGH (ref 11–51)

## 2019-01-10 LAB — T4, FREE: FREE T4: 1.16 ng/dL (ref 0.82–1.77)

## 2019-01-10 LAB — TSH: TSH: 0.784 u[IU]/mL (ref 0.350–4.500)

## 2019-01-10 MED ORDER — SODIUM CHLORIDE 0.9 % IV BOLUS
1000.0000 mL | Freq: Once | INTRAVENOUS | Status: AC
  Administered 2019-01-10: 1000 mL via INTRAVENOUS

## 2019-01-10 MED ORDER — FAMOTIDINE 20 MG PO TABS: 20.0000 mg | ORAL_TABLET | Freq: Two times a day (BID) | ORAL | Status: DC

## 2019-01-10 MED ORDER — METOCLOPRAMIDE HCL 10 MG PO TABS
10.0000 mg | ORAL_TABLET | Freq: Three times a day (TID) | ORAL | Status: DC
  Administered 2019-01-12 – 2019-01-13 (×4): 10 mg via ORAL
  Filled 2019-01-10 (×4): qty 1

## 2019-01-10 MED ORDER — BENZONATATE 100 MG PO CAPS: 100.0000 mg | ORAL_CAPSULE | Freq: Three times a day (TID) | ORAL | Status: DC | PRN

## 2019-01-10 MED ORDER — ALBUTEROL SULFATE HFA 108 (90 BASE) MCG/ACT IN AERS: 2.0000 | INHALATION_SPRAY | Freq: Four times a day (QID) | RESPIRATORY_TRACT | Status: DC | PRN

## 2019-01-10 MED ORDER — SUCRALFATE 1 G PO TABS
1.0000 g | ORAL_TABLET | Freq: Once | ORAL | Status: AC
  Administered 2019-01-10: 1 g via ORAL
  Filled 2019-01-10: qty 1

## 2019-01-10 MED ORDER — ONDANSETRON HCL 4 MG PO TABS: 4.0000 mg | ORAL_TABLET | Freq: Four times a day (QID) | ORAL | Status: DC | PRN

## 2019-01-10 MED ORDER — ALBUTEROL SULFATE (2.5 MG/3ML) 0.083% IN NEBU: 2.5000 mg | INHALATION_SOLUTION | Freq: Four times a day (QID) | RESPIRATORY_TRACT | Status: DC | PRN

## 2019-01-10 MED ORDER — ONDANSETRON HCL 4 MG/2ML IJ SOLN
4.0000 mg | Freq: Once | INTRAMUSCULAR | Status: AC
  Administered 2019-01-10: 4 mg via INTRAVENOUS
  Filled 2019-01-10: qty 2

## 2019-01-10 MED ORDER — SODIUM CHLORIDE 0.9 % IV SOLN
INTRAVENOUS | Status: DC
  Administered 2019-01-10 – 2019-01-12 (×4): via INTRAVENOUS

## 2019-01-10 MED ORDER — DOCUSATE SODIUM 100 MG PO CAPS: 100.0000 mg | ORAL_CAPSULE | Freq: Two times a day (BID) | ORAL | Status: DC

## 2019-01-10 MED ORDER — ACETAMINOPHEN 650 MG RE SUPP: 650.0000 mg | Freq: Four times a day (QID) | RECTAL | Status: DC | PRN

## 2019-01-10 MED ORDER — ASPIRIN 81 MG PO CHEW
81.0000 mg | CHEWABLE_TABLET | Freq: Every day | ORAL | Status: DC
  Administered 2019-01-12 – 2019-01-13 (×2): 81 mg via ORAL
  Filled 2019-01-10 (×2): qty 1

## 2019-01-10 MED ORDER — ACETAMINOPHEN 325 MG PO TABS: 650.0000 mg | ORAL_TABLET | Freq: Four times a day (QID) | ORAL | Status: DC | PRN

## 2019-01-10 MED ORDER — ENOXAPARIN SODIUM 40 MG/0.4ML ~~LOC~~ SOLN
40.0000 mg | Freq: Every day | SUBCUTANEOUS | Status: DC
  Administered 2019-01-11 – 2019-01-12 (×2): 40 mg via SUBCUTANEOUS
  Filled 2019-01-10 (×2): qty 0.4

## 2019-01-10 MED ORDER — PANTOPRAZOLE SODIUM 40 MG PO TBEC: 40.0000 mg | DELAYED_RELEASE_TABLET | Freq: Two times a day (BID) | ORAL | Status: DC

## 2019-01-10 MED ORDER — SENNOSIDES-DOCUSATE SODIUM 8.6-50 MG PO TABS: 1.0000 | ORAL_TABLET | Freq: Every evening | ORAL | Status: DC | PRN

## 2019-01-10 MED ORDER — ONDANSETRON HCL 4 MG/2ML IJ SOLN: 4.0000 mg | Freq: Four times a day (QID) | INTRAMUSCULAR | Status: DC | PRN

## 2019-01-10 MED ORDER — SUCRALFATE 1 G PO TABS
1.0000 g | ORAL_TABLET | Freq: Two times a day (BID) | ORAL | Status: DC
  Administered 2019-01-11 – 2019-01-12 (×2): 1 g via ORAL
  Filled 2019-01-10 (×2): qty 1

## 2019-01-10 MED ORDER — ADULT MULTIVITAMIN W/MINERALS CH
1.0000 | ORAL_TABLET | Freq: Every day | ORAL | Status: DC
  Administered 2019-01-12 – 2019-01-13 (×2): 1 via ORAL
  Filled 2019-01-10 (×2): qty 1

## 2019-01-10 NOTE — H&P (Signed)
History and Physical    [name] FTD:[id number] DOB: [birth year] DOA: 01/10/2019  Referring MD/NP/PA:   PCP: Billie Ruddy, MD   Patient coming from:  The patient is coming from home.  At baseline, pt is independent for most of ADL.        Chief Complaint: Difficulty swallowing  HPI: [name] is a 58 y.o. female with medical history significant of GERD, diverticulosis, hyperlipidemia, who presents with difficulty swallowing.  Patient states that she has been having difficulty swallowing for more than 1 month.  She has difficulty swallowing to both liquid and solid food. States she feels that liquids and solids get stuck in her chest and then she vomits it back up. States she has lost 30 pounds in the last month. She also has chronic hiccups.  Patient states that she has had intermittent mild left lower quadrant abdominal pain.  No diarrhea.  She is mildly constipated.  Pt was seen twice in ED on 2/24/ and 3/6, and had CT of abdomen/pelvis on 3/6 which did not show any acute issues to explain her symptoms.  Pt was given prescription of Reglan and Thorazine.  States she stopped taking her Thorazine due to palpitations. Patient denies chest pain, shortness breath, cough.  No fever or chills.  No symptoms of UTI or unilateral weakness. Patient has  generalized weakness and lightheadedness. No fall.   # CT abdomen/pelvis on 12/28/2018: 1. No acute abnormality. 2. Mild colonic diverticulosis. 3. Cholelithiasis.  ED Course: pt was found to have WBC 9.5, lipase 82, creatinine 1.07, BUN 23, GFR >60, hypercalcemia with calcium of 10.6, soft blood pressure, heart rate 58, oxygen saturation 96% on room air.  Patient is placed on telemetry bed for observation.  Barium swallowing study showed:  1. Negative for stricture or mass 2. Decreased esophageal motility.  Possible esophagitis.  Review of Systems:   General: no fevers, chills, has body weight loss, has poor appetite, has fatigue  HEENT: no blurry vision, hearing changes or sore throat Respiratory: no dyspnea, coughing, wheezing CV: no chest pain, no palpitations GI: has nausea, vomiting, LLQ abdominal pain, constipation and difficulty swallowing GU: no dysuria, burning on urination, increased urinary frequency, hematuria  Ext: no leg edema Neuro: no unilateral weakness, numbness, or tingling, no vision change or hearing loss Skin: no rash, no skin tear. MSK: No muscle spasm, no deformity, no limitation of range of movement in spin Heme: No easy bruising.  Travel history: No recent long distant travel.  Allergy: No Known Allergies  Past Medical History:  Diagnosis Date  . Allergy    uses Albuterol inhaler during allergy season  . Diverticulosis 2013   Colonoscopy  . GERD (gastroesophageal reflux disease)   . Hiatal hernia    CT abdomen in August 2013  . Hx of colonic polyps 2013   Colonoscopy   . Osteoarthritis    b/l hip, seen on CT abd in 05/2012    Past Surgical History:  Procedure Laterality Date  . TONSILLECTOMY     childhood  . TOTAL ABDOMINAL HYSTERECTOMY W/ BILATERAL SALPINGOOPHORECTOMY  2008   Fibroids, done by Dr. Clovia Cuff at [facility] History:  reports that she has quit smoking. Her smoking use included cigarettes. She has a 15.50 pack-year smoking history. She has never used smokeless tobacco. She reports previous alcohol use of about 2.0 standard drinks of alcohol per week. She reports that she does not use drugs.  Family History:  Family History  Problem Relation Age of Onset  . Stroke Father        passed away at 25 yo of cerebal brain hemorrhage   . Hypertension Mother   . Hyperlipidemia Mother   . Colon cancer Neg Hx   . Stomach cancer Neg Hx   . Colon polyps Neg Hx   . Rectal cancer Neg Hx      Prior to Admission medications   Medication Sig Start Date End Date Taking? Authorizing Provider  albuterol (PROVENTIL HFA;VENTOLIN HFA) 108 (90 BASE) MCG/ACT  inhaler Inhale 2 puffs into the lungs every 6 (six) hours as needed. For shortness of breath   Yes [provider]  aspirin 81 MG chewable tablet Chew 81 mg by mouth daily.   Yes [provider]  benzonatate (TESSALON) 100 MG capsule Take 1 capsule (100 mg total) by mouth every 8 (eight) hours. 12/28/18  Yes Deno Etienne, DO  metoCLOPramide (REGLAN) 10 MG tablet Take 1 tablet (10 mg total) by mouth every 6 (six) hours as needed for nausea (or hiccups). 0/93/23  Yes Delora Fuel, MD  Multiple Vitamin (MULTIVITAMIN WITH MINERALS) TABS tablet Take 1 tablet by mouth daily.   Yes [provider]  ranitidine (ZANTAC) 150 MG tablet Take 1 tablet (150 mg total) by mouth 2 (two) times daily. 12/28/18  Yes Deno Etienne, DO  chlorproMAZINE (THORAZINE) 25 MG tablet Take 1 tablet (25 mg total) by mouth 3 (three) times daily. Patient not taking: Reported on 01/10/2019 12/28/18   Deno Etienne, DO    Physical Exam: Vitals:   01/10/19 1930 01/10/19 1945 01/10/19 2015 01/10/19 2030  BP: 98/63 (!) 92/57 101/70 106/66  Pulse: 63 (!) 58 64 62  Resp: (!) 24 19 18 16   Temp:      TempSrc:      SpO2: 100% 100% 100% 100%  Weight:       General: Not in acute distress.  Dry mucus and membrane HEENT:       Eyes: PERRL, EOMI, no scleral icterus.       ENT: No discharge from the ears and nose, no pharynx injection, no tonsillar enlargement.        Neck: No JVD, no bruit, no mass felt. Heme: No neck lymph node enlargement. Cardiac: S1/S2, RRR, No murmurs, No gallops or rubs. Respiratory: No rales, wheezing, rhonchi or rubs. GI: Soft, nondistended, nontender, no rebound pain, no organomegaly, BS present. GU: No hematuria Ext: No pitting leg edema bilaterally. 2+DP/PT pulse bilaterally. Musculoskeletal: No joint deformities, No joint redness or warmth, no limitation of ROM in spin. Skin: No rashes.  Neuro: Alert, oriented X3, cranial nerves II-XII grossly intact, moves all extremities normally. Psych:  Patient is not psychotic, no suicidal or hemocidal ideation.  Labs on Admission: I have personally reviewed following labs and imaging studies  CBC: Recent Labs  Lab 01/10/19 1640  WBC 9.5  NEUTROABS 6.5  HGB 16.1*  HCT 46.7*  MCV 84.6  PLT 557   Basic Metabolic Panel: Recent Labs  Lab 01/10/19 1640  NA 137  K 3.6  CL 103  CO2 18*  GLUCOSE 109*  BUN 23*  CREATININE 1.07*  CALCIUM 10.6*   GFR: Estimated Creatinine Clearance: 65.1 mL/min (A) (by C-G formula based on SCr of 1.07 mg/dL (H)). Liver Function Tests: Recent Labs  Lab 01/10/19 1640  AST 23  ALT 35  ALKPHOS 98  BILITOT 1.5*  PROT 7.4  ALBUMIN 4.4   Recent Labs  Lab  01/10/19 1640  LIPASE 82*   No results for input(s): AMMONIA in the last 168 hours. Coagulation Profile: No results for input(s): INR, PROTIME in the last 168 hours. Cardiac Enzymes: No results for input(s): CKTOTAL, CKMB, CKMBINDEX, TROPONINI in the last 168 hours. BNP (last 3 results) No results for input(s): PROBNP in the last 8760 hours. HbA1C: No results for input(s): HGBA1C in the last 72 hours. CBG: No results for input(s): GLUCAP in the last 168 hours. Lipid Profile: No results for input(s): CHOL, HDL, LDLCALC, TRIG, CHOLHDL, LDLDIRECT in the last 72 hours. Thyroid Function Tests: No results for input(s): TSH, T4TOTAL, FREET4, T3FREE, THYROIDAB in the last 72 hours. Anemia Panel: No results for input(s): VITAMINB12, FOLATE, FERRITIN, TIBC, IRON, RETICCTPCT in the last 72 hours. Urine analysis:    Component Value Date/Time   COLORURINE YELLOW 12/28/2018 1417   APPEARANCEUR HAZY (A) 12/28/2018 1417   LABSPEC 1.031 (H) 12/28/2018 1417   PHURINE 9.0 (H) 12/28/2018 1417   GLUCOSEU NEGATIVE 12/28/2018 1417   HGBUR NEGATIVE 12/28/2018 1417   BILIRUBINUR NEGATIVE 12/28/2018 1417   KETONESUR 5 (A) 12/28/2018 1417   PROTEINUR NEGATIVE 12/28/2018 1417   UROBILINOGEN 0.2 07/13/2015 2351   NITRITE NEGATIVE 12/28/2018 1417    LEUKOCYTESUR NEGATIVE 12/28/2018 1417   Sepsis Labs: @LABRCNTIP (procalcitonin:4,lacticidven:4) )No results found for this or any previous visit (from the past 240 hour(s)).   Radiological Exams on Admission: Dg Esophagus W Single Cm (sol Or Thin Ba)  Result Date: 01/10/2019 CLINICAL DATA:  Dysphagia, weight loss.  Vomiting after eating. EXAM: ESOPHOGRAM/BARIUM SWALLOW TECHNIQUE: Single contrast examination was performed using  thin barium. FLUOROSCOPY TIME:  Fluoroscopy Time:  1 minutes 0 seconds Radiation Exposure Index (if provided by the fluoroscopic device): Number of Acquired Spot Images: 0 COMPARISON:  None. FINDINGS: Decreased esophageal motility. Negative for stricture or mass. Mild mucosal irregularity may be due to small sips versus esophagitis. No ulceration. Small hiatal hernia Barium tablet passed into the stomach without delay. IMPRESSION: Negative for stricture or mass Decreased esophageal motility.  Possible esophagitis. Electronically Signed   By: Franchot Gallo M.D.   On: 01/10/2019 18:11     EKG: Independently reviewed.  Sinus bradycardia, QTC 416, LAE, no ischemic change.  Assessment/Plan Principal Problem:   Difficulty swallowing Active Problems:   GERD (gastroesophageal reflux disease)   Dehydration   Hypercalcemia   Sinus bradycardia   LLQ abdominal pain   Difficulty swallowing and dehydration: Barium swallowing study showed decreased esophageal motility and possible esophagitis.  Patient has dehydration and hemoconcentration with hemoglobin 16.1.  Hypercalcemia with calcium 10.6.  Also has significant weight loss.  -will place on tele bed for obs due to bradycardia -IVF: 2 L normal saline, followed by 125 cc/h -Scheduled Reglan, PRN Zofran -Start Carafate -Protonix 40 mg twice daily -may need to discuss with GI in the morning -N.p.o. after midnight in case patient needs EGD  GERD (gastroesophageal reflux disease): -Protonix  Hypercalcemia: Calcium 10.6.   This is due to dehydration -IV fluid as above  Sinus bradycardia: Heart rate 58 today.  Patient had bradycardia with heart rates 48 on 12/07/2018 when she was seen in the ED.  Patient does not have chest pain or shortness of breath.  Seems to be asymptomatic.  Patient had TSH 0.472. -Telemetry monitor. -ED physician ordered TSH and free T4--> will follow up  LLQ abdominal pain: Patient is very mild left lower quadrant abdominal pain. Lipase 82. Physical examination is benign.  CT abdomen/pelvis on 3/6 showed no  acute issues, which is reassuring. - Observe closely now.   DVT ppx: SQ Lovenox Code Status: Full code Family Communication:  Yes, patient's  husband at bed side Disposition Plan:  Anticipate discharge back to previous home environment Consults called:  none Admission status: Obs / tele        Date of Service 01/10/2019    [facility]   If 7PM-7AM, please contact night-coverage [email or web address] Password [facility] 01/10/2019, 9:42 PM

## 2019-01-10 NOTE — ED Triage Notes (Signed)
Pt in from [facility] for n/v LLQ abd pain x 1 mo. Denies any fevers or diarrhea. States she has emesis x 6 in past 24h and lost 30lbs in 1 mo. Has referral to GI doc, but unable to see d/t no insurance

## 2019-01-10 NOTE — ED Notes (Signed)
Patient transported to X-ray 

## 2019-01-10 NOTE — Patient Instructions (Signed)
Go to the emergency room to be evaluated for dehydration, nausea and vomiting, weight loss, and abdominal pain

## 2019-01-10 NOTE — ED Notes (Signed)
ED TO INPATIENT HANDOFF REPORT  ED Nurse Name and Phone #: Caprice Kluver [phone]  S Name/Age/Gender [name] 58 y.o. female Room/Bed: H021C/H021C  Code Status   Code Status: Full Code  Home/SNF/Other Home Patient oriented to: self, place, time and situation Is this baseline? Yes   Triage Complete: Triage complete  Chief Complaint Dehydration; N/V; Weight Loss; Abd Pain  Triage Note Pt in from [facility] and [facility] clinic for n/v LLQ abd pain x 1 mo. Denies any fevers or diarrhea. States she has emesis x 6 in past 24h and lost 30lbs in 1 mo. Has referral to GI doc, but unable to see d/t no insurance   Allergies No Known Allergies  Level of Care/Admitting Diagnosis ED Disposition    ED Disposition Condition [facility] Area: [place] [[id number]]  Level of Care: Telemetry Medical [104]  I expect the patient will be discharged within 24 hours: Yes  LOW acuity---Tx typically complete <24 hrs---ACUTE conditions typically can be evaluated <24 hours---LABS likely to return to acceptable levels <24 hours---IS near functional baseline---EXPECTED to return to current living arrangement---NOT newly hypoxic: Meets criteria for 5C-Observation unit  Diagnosis: Difficulty swallowing [315176]  Admitting Physician: Ivor Costa [[id number]]  Attending Physician: Ivor Costa [[id number]]  PT Class (Do Not Modify): Observation [104]  PT Acc Code (Do Not Modify): Observation [10022]       B Medical/Surgery History Past Medical History:  Diagnosis Date  . Allergy    uses Albuterol inhaler during allergy season  . Diverticulosis 2013   Colonoscopy  . GERD (gastroesophageal reflux disease)   . Hiatal hernia    CT abdomen in August 2013  . Hx of colonic polyps 2013   Colonoscopy   . Osteoarthritis    b/l hip, seen on CT abd in 05/2012   Past Surgical History:  Procedure Laterality Date  . TONSILLECTOMY     childhood  . TOTAL ABDOMINAL HYSTERECTOMY W/  BILATERAL SALPINGOOPHORECTOMY  2008   Fibroids, done by Dr. Clovia Cuff at [facility]     A IV Location/Drains/Wounds Patient Lines/Drains/Airways Status   Active Line/Drains/Airways    Name:   Placement date:   Placement time:   Site:   Days:   Peripheral IV 01/10/19 Left Antecubital   01/10/19    2145    Antecubital   less than 1          Intake/Output Last 24 hours  Intake/Output Summary (Last 24 hours) at 01/10/2019 2156 Last data filed at 01/10/2019 1814 Gross per 24 hour  Intake 1000 ml  Output -  Net 1000 ml    Labs/Imaging Results for orders placed or performed during the hospital encounter of 01/10/19 (from the past 48 hour(s))  CBC with Differential/Platelet     Status: Abnormal   Collection Time: 01/10/19  4:40 PM  Result Value Ref Range   WBC 9.5 4.0 - 10.5 K/uL   RBC 5.52 (H) 3.87 - 5.11 MIL/uL   Hemoglobin 16.1 (H) 12.0 - 15.0 g/dL   HCT 46.7 (H) 36.0 - 46.0 %   MCV 84.6 80.0 - 100.0 fL   MCH 29.2 26.0 - 34.0 pg   MCHC 34.5 30.0 - 36.0 g/dL   RDW 12.1 11.5 - 15.5 %   Platelets 282 150 - 400 K/uL   nRBC 0.0 0.0 - 0.2 %   Neutrophils Relative % 68 %   Neutro Abs 6.5 1.7 - 7.7 K/uL   Lymphocytes Relative 21 %  Lymphs Abs 1.9 0.7 - 4.0 K/uL   Monocytes Relative 8 %   Monocytes Absolute 0.8 0.1 - 1.0 K/uL   Eosinophils Relative 2 %   Eosinophils Absolute 0.1 0.0 - 0.5 K/uL   Basophils Relative 1 %   Basophils Absolute 0.1 0.0 - 0.1 K/uL   Immature Granulocytes 0 %   Abs Immature Granulocytes 0.04 0.00 - 0.07 K/uL    Comment: Performed at [facility], [place] [address] [place], [place] [address]  Comprehensive metabolic panel     Status: Abnormal   Collection Time: 01/10/19  4:40 PM  Result Value Ref Range   Sodium 137 135 - 145 mmol/L   Potassium 3.6 3.5 - 5.1 mmol/L   Chloride 103 98 - 111 mmol/L   CO2 18 (L) 22 - 32 mmol/L   Glucose, Bld 109 (H) 70 - 99 mg/dL   BUN 23 (H) 6 - 20 mg/dL   Creatinine, Ser 1.07 (H) 0.44 - 1.00 mg/dL    Calcium 10.6 (H) 8.9 - 10.3 mg/dL   Total Protein 7.4 6.5 - 8.1 g/dL   Albumin 4.4 3.5 - 5.0 g/dL   AST 23 15 - 41 U/L   ALT 35 0 - 44 U/L   Alkaline Phosphatase 98 38 - 126 U/L   Total Bilirubin 1.5 (H) 0.3 - 1.2 mg/dL   GFR calc non Af Amer 58 (L) >60 mL/min   GFR calc Af Amer >60 >60 mL/min   Anion gap 16 (H) 5 - 15    Comment: Performed at [facility], [place] [address], [place], [place] [address]  Lipase, blood     Status: Abnormal   Collection Time: 01/10/19  4:40 PM  Result Value Ref Range   Lipase 82 (H) 11 - 51 U/L    Comment: Performed at [facility] [address], [place], [place] [address]   Dg Esophagus W Single Cm (sol Or Thin Ba)  Result Date: 01/10/2019 CLINICAL DATA:  Dysphagia, weight loss.  Vomiting after eating. EXAM: ESOPHOGRAM/BARIUM SWALLOW TECHNIQUE: Single contrast examination was performed using  thin barium. FLUOROSCOPY TIME:  Fluoroscopy Time:  1 minutes 0 seconds Radiation Exposure Index (if provided by the fluoroscopic device): Number of Acquired Spot Images: 0 COMPARISON:  None. FINDINGS: Decreased esophageal motility. Negative for stricture or mass. Mild mucosal irregularity may be due to small sips versus esophagitis. No ulceration. Small hiatal hernia Barium tablet passed into the stomach without delay. IMPRESSION: Negative for stricture or mass Decreased esophageal motility.  Possible esophagitis. Electronically Signed   By: Franchot Gallo M.D.   On: 01/10/2019 18:11    Pending Labs Unresulted Labs (From admission, onward)    Start     Ordered   01/11/19 2458  Basic metabolic panel  Tomorrow morning,   R     01/10/19 2117   01/10/19 2116  HIV antibody (Routine Testing)  Once,   R     01/10/19 2117   01/10/19 2013  T4, free  ONCE - STAT,   STAT     01/10/19 2017   01/10/19 2013  TSH  ONCE - STAT,   STAT     01/10/19 2017          Vitals/Pain Today's Vitals   01/10/19 1945 01/10/19 1959 01/10/19 2015 01/10/19 2030  BP: (!) 92/57   101/70 106/66  Pulse: (!) 58  64 62  Resp: 19  18 16   Temp:      TempSrc:  SpO2: 100%  100% 100%  Weight:      PainSc:  6       Isolation Precautions No active isolations  Medications Medications  aspirin chewable tablet 81 mg (has no administration in time range)  metoCLOPramide (REGLAN) tablet 10 mg (has no administration in time range)  multivitamin with minerals tablet 1 tablet (has no administration in time range)  benzonatate (TESSALON) capsule 100 mg (has no administration in time range)  sucralfate (CARAFATE) tablet 1 g (has no administration in time range)  0.9 %  sodium chloride infusion (has no administration in time range)  enoxaparin (LOVENOX) injection 40 mg (has no administration in time range)  acetaminophen (TYLENOL) tablet 650 mg (has no administration in time range)    Or  acetaminophen (TYLENOL) suppository 650 mg (has no administration in time range)  ondansetron (ZOFRAN) tablet 4 mg (has no administration in time range)    Or  ondansetron (ZOFRAN) injection 4 mg (has no administration in time range)  senna-docusate (Senokot-S) tablet 1 tablet (has no administration in time range)  albuterol (PROVENTIL) (2.5 MG/3ML) 0.083% nebulizer solution 2.5 mg (has no administration in time range)  pantoprazole (PROTONIX) EC tablet 40 mg (has no administration in time range)  sodium chloride 0.9 % bolus 1,000 mL (0 mLs Intravenous Stopped 01/10/19 1814)  ondansetron (ZOFRAN) injection 4 mg (4 mg Intravenous Given 01/10/19 1655)  sodium chloride 0.9 % bolus 1,000 mL (1,000 mLs Intravenous New Bag/Given 01/10/19 1827)  sucralfate (CARAFATE) tablet 1 g (1 g Oral Given 01/10/19 1909)    Mobility walks Low fall risk   Focused Assessments C   R Recommendations: See Admitting Provider Note  Report given to:   Additional Notes:

## 2019-01-10 NOTE — ED Provider Notes (Signed)
[facility] EMERGENCY DEPARTMENT Provider Note   CSN: [id number] Arrival date & time: 01/10/19  1543    History   Chief Complaint Chief Complaint  Patient presents with  . Dehydration  . Weight Loss  . Abdominal Pain  . N/V    HPI [name] is a 58 y.o. female.     HPI Patient presents with several weeks of difficulty swallowing.  States she feels that liquids and solids get stuck in her chest and then she vomits it back up.  States she has lost 30 pounds in the last month.  She had decreased bowel movements which she attributes to decreased oral intake.  She also endorses chronic hiccups.  Mostly in the emergency department earlier this month had CT abdomen pelvis without acute findings.  Prescribed Reglan and Thorazine.  States she stopped taking her Thorazine due to palpitations.  Patient also endorses generalized weakness and lightheadedness. Past Medical History:  Diagnosis Date  . Allergy    uses Albuterol inhaler during allergy season  . Diverticulosis 2013   Colonoscopy  . GERD (gastroesophageal reflux disease)   . Hiatal hernia    CT abdomen in August 2013  . Hx of colonic polyps 2013   Colonoscopy   . Osteoarthritis    b/l hip, seen on CT abd in 05/2012    Patient Active Problem List   Diagnosis Date Noted  . Dehydration 01/10/2019  . Hypercalcemia 01/10/2019  . Sinus bradycardia 01/10/2019  . Difficulty swallowing 01/10/2019  . LLQ abdominal pain 01/10/2019  . Calculus of gallbladder without cholecystitis without obstruction 12/31/2018  . History of nicotine dependence 12/31/2018  . Hiccups 12/31/2018  . Dysphagia 12/31/2018  . History of asthma 12/31/2018  . Rash and nonspecific skin eruption 05/01/2013  . DJD (degenerative joint disease) of knee 11/19/2012  . HLD (hyperlipidemia) 11/13/2012  . Osteoarthritis of left knee 07/26/2012  . GERD (gastroesophageal reflux disease) 06/15/2012  . Health care maintenance 06/15/2012    Past Surgical History:  Procedure Laterality Date  . TONSILLECTOMY     childhood  . TOTAL ABDOMINAL HYSTERECTOMY W/ BILATERAL SALPINGOOPHORECTOMY  2008   Fibroids, done by Dr. Clovia Cuff at [facility]     OB History    Gravida  3   Para  3   Term      Preterm      AB      Living        SAB      TAB      Ectopic      Multiple      Live Births               Home Medications    Prior to Admission medications   Medication Sig Start Date End Date Taking? Authorizing Provider  albuterol (PROVENTIL HFA;VENTOLIN HFA) 108 (90 BASE) MCG/ACT inhaler Inhale 2 puffs into the lungs every 6 (six) hours as needed. For shortness of breath   Yes [provider]  aspirin 81 MG chewable tablet Chew 81 mg by mouth daily.   Yes [provider]  benzonatate (TESSALON) 100 MG capsule Take 1 capsule (100 mg total) by mouth every 8 (eight) hours. 12/28/18  Yes Deno Etienne, DO  metoCLOPramide (REGLAN) 10 MG tablet Take 1 tablet (10 mg total) by mouth every 6 (six) hours as needed for nausea (or hiccups). 0/63/01  Yes Delora Fuel, MD  Multiple Vitamin (MULTIVITAMIN WITH MINERALS) TABS tablet Take 1 tablet by mouth  daily.   Yes [provider]  ranitidine (ZANTAC) 150 MG tablet Take 1 tablet (150 mg total) by mouth 2 (two) times daily. 12/28/18  Yes Deno Etienne, DO  chlorproMAZINE (THORAZINE) 25 MG tablet Take 1 tablet (25 mg total) by mouth 3 (three) times daily. Patient not taking: Reported on 01/10/2019 12/28/18   Deno Etienne, DO    Family History Family History  Problem Relation Age of Onset  . Stroke Father        passed away at 75 yo of cerebal brain hemorrhage   . Hypertension Mother   . Hyperlipidemia Mother   . Colon cancer Neg Hx   . Stomach cancer Neg Hx   . Colon polyps Neg Hx   . Rectal cancer Neg Hx     Social History Social History   Tobacco Use  . Smoking status: Former Smoker    Packs/day: 0.50    Years: 31.00    Pack years: 15.50     Types: Cigarettes  . Smokeless tobacco: Never Used  Substance Use Topics  . Alcohol use: Not Currently    Alcohol/week: 2.0 standard drinks    Types: 2 Glasses of wine per week    Comment: 1-2 beers/week  . Drug use: No     Allergies   Patient has no known allergies.   Review of Systems Review of Systems  Constitutional: Positive for appetite change and fatigue. Negative for chills and fever.  HENT: Positive for trouble swallowing.   Eyes: Negative for photophobia and visual disturbance.  Respiratory: Negative for cough, shortness of breath and wheezing.   Cardiovascular: Negative for chest pain.  Gastrointestinal: Positive for abdominal pain, constipation, nausea and vomiting. Negative for blood in stool and diarrhea.  Genitourinary: Negative for dysuria, flank pain and frequency.  Musculoskeletal: Negative for back pain and myalgias.  Skin: Negative for rash and wound.  Neurological: Positive for dizziness and light-headedness. Negative for weakness, numbness and headaches.  All other systems reviewed and are negative.    Physical Exam Updated Vital Signs BP 106/66   Pulse 62   Temp 98.4 F (36.9 C) (Oral)   Resp 16   Wt 83 kg   SpO2 100%   BMI 28.66 kg/m   Physical Exam Vitals signs and nursing note reviewed.  Constitutional:      Appearance: Normal appearance. She is well-developed.  HENT:     Head: Normocephalic and atraumatic.     Mouth/Throat:     Mouth: Mucous membranes are dry.  Eyes:     Extraocular Movements: Extraocular movements intact.     Pupils: Pupils are equal, round, and reactive to light.  Neck:     Musculoskeletal: Normal range of motion and neck supple.  Cardiovascular:     Rate and Rhythm: Normal rate and regular rhythm.     Heart sounds: No murmur. No friction rub. No gallop.   Pulmonary:     Effort: Pulmonary effort is normal. No respiratory distress.     Breath sounds: Normal breath sounds. No stridor. No wheezing, rhonchi or  rales.  Chest:     Chest wall: No tenderness.  Abdominal:     General: Bowel sounds are normal. There is no distension.     Palpations: Abdomen is soft. There is no mass.     Tenderness: There is no abdominal tenderness. There is no right CVA tenderness, left CVA tenderness, guarding or rebound.     Hernia: No hernia is present.  Musculoskeletal: Normal range of  motion.        General: No swelling, tenderness, deformity or signs of injury.     Right lower leg: No edema.     Left lower leg: No edema.  Skin:    General: Skin is warm and dry.     Findings: No erythema or rash.  Neurological:     General: No focal deficit present.     Mental Status: She is alert and oriented to person, place, and time.  Psychiatric:        Behavior: Behavior normal.      ED Treatments / Results  Labs (all labs ordered are listed, but only abnormal results are displayed) Labs Reviewed  CBC WITH DIFFERENTIAL/PLATELET - Abnormal; Notable for the following components:      Result Value   RBC 5.52 (*)    Hemoglobin 16.1 (*)    HCT 46.7 (*)    All other components within normal limits  COMPREHENSIVE METABOLIC PANEL - Abnormal; Notable for the following components:   CO2 18 (*)    Glucose, Bld 109 (*)    BUN 23 (*)    Creatinine, Ser 1.07 (*)    Calcium 10.6 (*)    Total Bilirubin 1.5 (*)    GFR calc non Af Amer 58 (*)    Anion gap 16 (*)    All other components within normal limits  LIPASE, BLOOD - Abnormal; Notable for the following components:   Lipase 82 (*)    All other components within normal limits  T4, FREE  TSH  HIV ANTIBODY (ROUTINE TESTING W REFLEX)  BASIC METABOLIC PANEL    EKG EKG Interpretation  Date/Time:  Thursday January 10 2019 16:53:42 EDT Ventricular Rate:  59 PR Interval:    QRS Duration: 98 QT Interval:  420 QTC Calculation: 416 R Axis:   34 Text Interpretation:  Sinus rhythm Confirmed by Julianne Rice [phone]) on 01/10/2019 8:12:00 PM   Radiology Dg  Esophagus W Single Cm (sol Or Thin Ba)  Result Date: 01/10/2019 CLINICAL DATA:  Dysphagia, weight loss.  Vomiting after eating. EXAM: ESOPHOGRAM/BARIUM SWALLOW TECHNIQUE: Single contrast examination was performed using  thin barium. FLUOROSCOPY TIME:  Fluoroscopy Time:  1 minutes 0 seconds Radiation Exposure Index (if provided by the fluoroscopic device): Number of Acquired Spot Images: 0 COMPARISON:  None. FINDINGS: Decreased esophageal motility. Negative for stricture or mass. Mild mucosal irregularity may be due to small sips versus esophagitis. No ulceration. Small hiatal hernia Barium tablet passed into the stomach without delay. IMPRESSION: Negative for stricture or mass Decreased esophageal motility.  Possible esophagitis. Electronically Signed   By: Franchot Gallo M.D.   On: 01/10/2019 18:11    Procedures Procedures (including critical care time)  Medications Ordered in ED Medications  aspirin chewable tablet 81 mg (has no administration in time range)  metoCLOPramide (REGLAN) tablet 10 mg (has no administration in time range)  multivitamin with minerals tablet 1 tablet (has no administration in time range)  benzonatate (TESSALON) capsule 100 mg (has no administration in time range)  sucralfate (CARAFATE) tablet 1 g (has no administration in time range)  0.9 %  sodium chloride infusion (has no administration in time range)  enoxaparin (LOVENOX) injection 40 mg (has no administration in time range)  acetaminophen (TYLENOL) tablet 650 mg (has no administration in time range)    Or  acetaminophen (TYLENOL) suppository 650 mg (has no administration in time range)  ondansetron (ZOFRAN) tablet 4 mg (has no administration in time range)  Or  ondansetron (ZOFRAN) injection 4 mg (has no administration in time range)  senna-docusate (Senokot-S) tablet 1 tablet (has no administration in time range)  albuterol (PROVENTIL) (2.5 MG/3ML) 0.083% nebulizer solution 2.5 mg (has no administration in  time range)  pantoprazole (PROTONIX) EC tablet 40 mg (has no administration in time range)  sodium chloride 0.9 % bolus 1,000 mL (0 mLs Intravenous Stopped 01/10/19 1814)  ondansetron (ZOFRAN) injection 4 mg (4 mg Intravenous Given 01/10/19 1655)  sodium chloride 0.9 % bolus 1,000 mL (1,000 mLs Intravenous New Bag/Given 01/10/19 1827)  sucralfate (CARAFATE) tablet 1 g (1 g Oral Given 01/10/19 1909)     Initial Impression / Assessment and Plan / ED Course  I have reviewed the triage vital signs and the nursing notes.  Pertinent labs & imaging results that were available during my care of the patient were reviewed by me and considered in my medical decision making (see chart for details).        Patient states she is feeling better after IV fluids.  Esophagram demonstrated dysmotility and questionable esophagitis.  Mild bump in creatinine likely associated with dehydration.  Patient has been mildly tensive and bradycardic.  She is not on rate altering medication.  Some of this may be contributing to her symptomatology.  EKG with sinus rhythm.  Will check thyroid studies.  Discussed with hospitalist who will admit for observation, IV fluids.  Final Clinical Impressions(s) / ED Diagnoses   Final diagnoses:  Dysphagia  Dehydration  Bradycardia    ED Discharge Orders    None       Julianne Rice, MD 01/10/19 2143

## 2019-01-11 DIAGNOSIS — R1032 Left lower quadrant pain: Secondary | ICD-10-CM

## 2019-01-11 DIAGNOSIS — E860 Dehydration: Secondary | ICD-10-CM

## 2019-01-11 DIAGNOSIS — R112 Nausea with vomiting, unspecified: Secondary | ICD-10-CM

## 2019-01-11 LAB — BASIC METABOLIC PANEL
Anion gap: 9 (ref 5–15)
BUN: 21 mg/dL — ABNORMAL HIGH (ref 6–20)
CO2: 20 mmol/L — ABNORMAL LOW (ref 22–32)
Calcium: 9.3 mg/dL (ref 8.9–10.3)
Chloride: 106 mmol/L (ref 98–111)
Creatinine, Ser: 0.86 mg/dL (ref 0.44–1.00)
GFR calc Af Amer: >60 mL/min (ref >60)
GFR calc non Af Amer: >60 mL/min (ref >60)
Glucose, Bld: 103 mg/dL — ABNORMAL HIGH (ref 70–99)
Potassium: 3.6 mmol/L (ref 3.5–5.1)
Sodium: 135 mmol/L (ref 135–145)

## 2019-01-11 LAB — HIV ANTIBODY (ROUTINE TESTING W REFLEX): HIV Screen 4th Generation wRfx: NONREACTIVE

## 2019-01-11 MED ORDER — PANTOPRAZOLE SODIUM 40 MG IV SOLR
40.0000 mg | Freq: Two times a day (BID) | INTRAVENOUS | Status: DC
  Administered 2019-01-11 – 2019-01-12 (×4): 40 mg via INTRAVENOUS
  Filled 2019-01-11 (×4): qty 40

## 2019-01-11 MED ORDER — ONDANSETRON HCL 4 MG/2ML IJ SOLN
4.0000 mg | Freq: Four times a day (QID) | INTRAMUSCULAR | Status: DC
  Administered 2019-01-11 – 2019-01-13 (×6): 4 mg via INTRAVENOUS
  Filled 2019-01-11 (×6): qty 2

## 2019-01-11 NOTE — Progress Notes (Addendum)
Progress Note    [name]  VOZ:[id number] DOB: [birth year]  DOA: 01/10/2019 PCP: Billie Ruddy, MD    Brief Narrative:   Chief complaint: Difficulty swallowing  Medical records reviewed and are as summarized below:  [name] is an 58 y.o. female past medical history significant for GERD, diverticulosis, and hyperlipidemia; who presents for difficulty swallowing over the last month.  Assessment/Plan:   Principal Problem:   Difficulty swallowing Active Problems:   GERD (gastroesophageal reflux disease)   Dehydration   Hypercalcemia   Sinus bradycardia   LLQ abdominal pain  Dysphagia: Patient reports symptoms for more than 1 month of both liquids and solids.  CT of the abdomen and pelvis from 3/6, did not show any acute abnormalities that may explain patient's symptoms.  Barium swallow was negative for stricture or mass but did show decreased esophageal motility and possible esophagitis. -Clear liquid diet as tolerated, and n.p.o. after midnight -Continue normal saline IV fluids -Changed Protonix to IV -Continue Carafate when able -Consulted gastroenterology, plan on EGD in a.m.  GERD: -Continue Protonix  Left lower quadrant abdominal pain secondary possibly to constipation: Patient reports being constipated without bowel movement and several days, but does not want to try anything until after EGD. -Continue stool softeners -Consider enema following EGD  Sinus bradycardia: Heart rates 48-56.  Patient is not on any rate controlling medications andotherwise asymptomatic. -Continue to monitor  Hypercalcemia: Resolved.  Secondary most likely to dehydration, but also may consider malignancy given significant weight loss.  Weight loss: Patient reports approximately 30 pound weight loss in last month.  Most likely related with dysphagia.    Body mass index is 27.93 kg/m.   Family Communication/Anticipated D/C date and plan/Code Status   DVT prophylaxis:  Lovenox ordered. Code Status: Full Code.  Family Communication: Discussed plan of care with the patient family present at bedside Disposition Plan: Possible discharge following EGD   Medical Consultants:    [facility]   Anti-Infectives:    None  Subjective:   Patient reports that she has been dealing with this for the last month, unable to even stand smell of food at this point.  She reports regurgitating up food and liquids.  States that she is lost approximately 30 pounds during this time period.  Objective:    Vitals:   01/10/19 2258 01/11/19 0545 01/11/19 0953 01/11/19 1712  BP:  98/64 (!) 102/55 100/68  Pulse:  (!) 57 (!) 54 (!) 52  Resp:  17 16 20   Temp:  98.2 F (36.8 C) 98.1 F (36.7 C) 98.2 F (36.8 C)  TempSrc:  Oral Oral Oral  SpO2:  95% 98% 99%  Weight:      Height: 5\' 7"  (1.702 m)       Intake/Output Summary (Last 24 hours) at 01/11/2019 1836 Last data filed at 01/11/2019 0500 Gross per 24 hour  Intake 629.17 ml  Output 200 ml  Net 429.17 ml   Filed Weights   01/10/19 1602 01/10/19 2239  Weight: 83 kg 80.9 kg    Exam: Constitutional: Middle-aged female NAD, calm, comfortable Eyes: PERRL, lids and conjunctivae normal ENMT: Mucous membranes are moist. Posterior pharynx clear of any exudate or lesions.  Neck: normal, supple, no masses, no thyromegaly Respiratory: clear to auscultation bilaterally, no wheezing, no crackles. Normal respiratory effort. No accessory muscle use.  Cardiovascular: Bradycardic, no murmurs / rubs / gallops. No extremity edema. 2+ pedal pulses. No carotid bruits.  Abdomen: no significant tenderness  to the left lower quadrant, no masses palpated. No hepatosplenomegaly. Bowel sounds positive.  Musculoskeletal: no clubbing / cyanosis. No joint deformity upper and lower extremities. Good ROM, no contractures. Normal muscle tone.  Skin: no rashes, lesions, ulcers. No induration Neurologic: CN 2-12 grossly intact.  Sensation intact, DTR normal. Strength 5/5 in all 4.  Psychiatric: Normal judgment and insight. Alert and oriented x 3. Normal mood.    Data Reviewed:   I have personally reviewed following labs and imaging studies:  Labs: Labs show the following:   Basic Metabolic Panel: Recent Labs  Lab 01/10/19 1640 01/11/19 0218  NA 137 135  K 3.6 3.6  CL 103 106  CO2 18* 20*  GLUCOSE 109* 103*  BUN 23* 21*  CREATININE 1.07* 0.86  CALCIUM 10.6* 9.3   GFR Estimated Creatinine Clearance: 79.9 mL/min (by C-G formula based on SCr of 0.86 mg/dL). Liver Function Tests: Recent Labs  Lab 01/10/19 1640  AST 23  ALT 35  ALKPHOS 98  BILITOT 1.5*  PROT 7.4  ALBUMIN 4.4   Recent Labs  Lab 01/10/19 1640  LIPASE 82*   No results for input(s): AMMONIA in the last 168 hours. Coagulation profile No results for input(s): INR, PROTIME in the last 168 hours.  CBC: Recent Labs  Lab 01/10/19 1640  WBC 9.5  NEUTROABS 6.5  HGB 16.1*  HCT 46.7*  MCV 84.6  PLT 282   Cardiac Enzymes: No results for input(s): CKTOTAL, CKMB, CKMBINDEX, TROPONINI in the last 168 hours. BNP (last 3 results) No results for input(s): PROBNP in the last 8760 hours. CBG: No results for input(s): GLUCAP in the last 168 hours. D-Dimer: No results for input(s): DDIMER in the last 72 hours. Hgb A1c: No results for input(s): HGBA1C in the last 72 hours. Lipid Profile: No results for input(s): CHOL, HDL, LDLCALC, TRIG, CHOLHDL, LDLDIRECT in the last 72 hours. Thyroid function studies: Recent Labs    01/10/19 10-Feb-2012  TSH 0.784   Anemia work up: No results for input(s): VITAMINB12, FOLATE, FERRITIN, TIBC, IRON, RETICCTPCT in the last 72 hours. Sepsis Labs: Recent Labs  Lab 01/10/19 1640  WBC 9.5    Microbiology No results found for this or any previous visit (from the past 240 hour(s)).  Procedures and diagnostic studies:  Dg Esophagus W Single Cm (sol Or Thin Ba)  Result Date: 01/10/2019 CLINICAL  DATA:  Dysphagia, weight loss.  Vomiting after eating. EXAM: ESOPHOGRAM/BARIUM SWALLOW TECHNIQUE: Single contrast examination was performed using  thin barium. FLUOROSCOPY TIME:  Fluoroscopy Time:  1 minutes 0 seconds Radiation Exposure Index (if provided by the fluoroscopic device): Number of Acquired Spot Images: 0 COMPARISON:  None. FINDINGS: Decreased esophageal motility. Negative for stricture or mass. Mild mucosal irregularity may be due to small sips versus esophagitis. No ulceration. Small hiatal hernia Barium tablet passed into the stomach without delay. IMPRESSION: Negative for stricture or mass Decreased esophageal motility.  Possible esophagitis. Electronically Signed   By: Franchot Gallo M.D.   On: 01/10/2019 18:11    Medications:   . aspirin  81 mg Oral Daily  . enoxaparin (LOVENOX) injection  40 mg Subcutaneous QHS  . metoCLOPramide  10 mg Oral TID AC  . multivitamin with minerals  1 tablet Oral Daily  . ondansetron  4 mg Intravenous Q6H  . pantoprazole (PROTONIX) IV  40 mg Intravenous Q12H  . sucralfate  1 g Oral BID   Continuous Infusions: . sodium chloride 125 mL/hr at 01/10/19 09-Feb-2257  LOS: 0 days    A   [facility]   *Please refer to [email or web address], password [email or web address] to get updated schedule on who will round on this patient, as hospitalists switch teams weekly. If 7PM-7AM, please contact night-coverage at [email or web address], password [email or web address] for any overnight needs.

## 2019-01-11 NOTE — H&P (View-Only) (Signed)
[facility] Consult: 12:29 PM 01/11/2019  LOS: 0 days    Referring Provider: Dr Fuller Plan  Primary Care Physician:  Billie Ruddy, MD Primary Gastroenterologist:  Dr. Verl Blalock    Reason for Consultation: Nausea, vomiting, difficulty swallowing.   HPI: [name] is a 58 y.o. female.  Past history of GERD, colon polyps, diverticulosis.   In 2013 she had similar GI problems and underwent 06/2012 EGD.  For hiccups and reflux symptoms.  Normal study.  5 cm hiatal hernia. The symptoms subsided with Protonix. 9/13 colonoscopy.  Descending and sigmoid diverticulosis, mild.  2, diminutive, sessile polyps in the sigmoid.  Otherwise normal study.  Pathology from the polyps was that of polypoid colorectal mucosa.  10-year follow-up colonoscopy suggested.  Patient takes OTC Zantac for heartburn.  May be 14 out of 28 days in a month.   She will get heartburn if she smells certain foods.  Sometimes the heartburn is triggered by foods. 4 weeks ago patient had sudden, acute onset of nausea, vomiting, hiccups and an element of dysphagia to both solids and liquids.  She is unable to keep anything down, vomits either foamy white or pale green watery liquid several times daily.  Neither the Zantac or antacids are helping.  She reports 30 pound weight loss.  Previously would have bowel movements daily, often with rapid bowel movements immediately after eating.  Lately she is having BMs every few days.  Her abdomen feels sore but she has been vomiting a lot, no deep visceral abdominal pain. Seen in the ED on 12/17/2018 and 12/28/2018 In February she was discharged on Reglan for possible viral gastroenteritis related nausea 12/28/2018 she was discharged on Thorazine for the hiccups, Tessalon Perles and Zantac.  There was  some mention of possible general surgery follow-up given gallstones found on CT abdomen pelvis but has not seen general surgery given that the gallstones were uncomplicated. [facility] and was sent over to the ED for further work-up and evaluation  Recent studies as follows 12/28/2018 CT abdomen pelvis with contrast which showed uncomplicated tiny gallstones, normal stomach, liver, pancreas, small bowel.  Colonic diverticulosis noted.. 01/10/2019 esophagram shows decreased esophageal motility but no stricture or mass.  There is mild mucosal irregularity.  No masses.  Small hiatal hernia.  Barium tablet passed into the stomach with no delay. Labs dating back to 3/6 to now show lipase going from 48 >> 82.  Bilirubin 1.1 >> 1.5.  Alkaline phosphatase and examination is normal. Lipase 48 >> 82.   Mild acute kidney injury yesterday, improved this morning. UBC is normal.  Hgb 16.1.   Past Medical History:  Diagnosis Date  . Allergy    uses Albuterol inhaler during allergy season  . Diverticulosis 2013   Colonoscopy  . GERD (gastroesophageal reflux disease)   . Hiatal hernia    CT abdomen in August 2013  . Hx of colonic polyps 2013   Colonoscopy   . Osteoarthritis    b/l hip, seen on  CT abd in 05/2012    Past Surgical History:  Procedure Laterality Date  . TONSILLECTOMY     childhood  . TOTAL ABDOMINAL HYSTERECTOMY W/ BILATERAL SALPINGOOPHORECTOMY  2008   Fibroids, done by Dr. Clovia Cuff at [facility]    Prior to Admission medications   Medication Sig Start Date End Date Taking? Authorizing Provider  albuterol (PROVENTIL HFA;VENTOLIN HFA) 108 (90 BASE) MCG/ACT inhaler Inhale 2 puffs into the lungs every 6 (six) hours as needed. For shortness of breath   Yes [provider]  aspirin 81 MG chewable tablet Chew 81 mg by mouth daily.   Yes [provider]  benzonatate (TESSALON) 100 MG capsule Take 1 capsule (100  mg total) by mouth every 8 (eight) hours. 12/28/18  Yes Deno Etienne, DO  metoCLOPramide (REGLAN) 10 MG tablet Take 1 tablet (10 mg total) by mouth every 6 (six) hours as needed for nausea (or hiccups). 1/82/99  Yes Delora Fuel, MD  Multiple Vitamin (MULTIVITAMIN WITH MINERALS) TABS tablet Take 1 tablet by mouth daily.   Yes [provider]  ranitidine (ZANTAC) 150 MG tablet Take 1 tablet (150 mg total) by mouth 2 (two) times daily. 12/28/18  Yes Deno Etienne, DO  chlorproMAZINE (THORAZINE) 25 MG tablet Take 1 tablet (25 mg total) by mouth 3 (three) times daily. Patient not taking: Reported on 01/10/2019 12/28/18   Deno Etienne, DO    Scheduled Meds: . aspirin  81 mg Oral Daily  . enoxaparin (LOVENOX) injection  40 mg Subcutaneous QHS  . metoCLOPramide  10 mg Oral TID AC  . multivitamin with minerals  1 tablet Oral Daily  . pantoprazole (PROTONIX) IV  40 mg Intravenous Q12H  . sucralfate  1 g Oral BID   Infusions: . sodium chloride 125 mL/hr at 01/10/19 2258   PRN Meds: acetaminophen **OR** acetaminophen, albuterol, benzonatate, ondansetron **OR** ondansetron (ZOFRAN) IV, senna-docusate   Allergies as of 01/10/2019  . (No Known Allergies)    Family History  Problem Relation Age of Onset  . Stroke Father        passed away at 32 yo of cerebal brain hemorrhage   . Hypertension Mother   . Hyperlipidemia Mother   . Colon cancer Neg Hx   . Stomach cancer Neg Hx   . Colon polyps Neg Hx   . Rectal cancer Neg Hx     Social History   Socioeconomic History  . Marital status: Single    Spouse name: Not on file  . Number of children: 3  . Years of education: 12th  . Highest education level: Not on file  Occupational History  . Occupation: ASS. CATHETERS    Employer: [employer]    Comment: layed off  Social Needs  . Financial resource strain: Not on file  . Food insecurity:    Worry: Not on file    Inability: Not on file  . Transportation needs:    Medical: Not on file     Non-medical: Not on file  Tobacco Use  . Smoking status: Former Smoker    Packs/day: 0.50    Years: 31.00    Pack years: 15.50    Types: Cigarettes  . Smokeless tobacco: Never Used  Substance and Sexual Activity  . Alcohol use: Not Currently    Alcohol/week: 2.0 standard drinks    Types: 2 Glasses of wine per week    Comment: 1-2 beers/week  . Drug use: No  . Sexual activity: Yes    Partners: Male  Comment: Same partner for 11 years  Lifestyle  . Physical activity:    Days per week: Not on file    Minutes per session: Not on file  . Stress: Not on file  Relationships  . Social connections:    Talks on phone: Not on file    Gets together: Not on file    Attends religious service: Not on file    Active member of club or organization: Not on file    Attends meetings of clubs or organizations: Not on file    Relationship status: Not on file  . Intimate partner violence:    Fear of current or ex partner: Not on file    Emotionally abused: Not on file    Physically abused: Not on file    Forced sexual activity: Not on file  Other Topics Concern  . Not on file  Social History Narrative   Live with her mother   Has three kids - all grown - all in [place] area   4 grandchildren   [place] at home - 11 years    REVIEW OF SYSTEMS: Constitutional: Feels tired and weak ENT:  No nose bleeds Pulm: No trouble breathing or cough. CV:  No palpitations, no LE edema.  No chest pain GU:  No hematuria, no frequency GI: See HPI Heme: Denies excessive or unusual bleeding or bruising. Transfusions: None Neuro:  No headaches, no peripheral tingling or numbness.   No syncope Derm:  No itching, no rash or sores.  Endocrine:  No sweats or chills.  No polyuria or dysuria Immunization: Immunization record review.  She had her Tdap vaccination in 2013 and that is the last vaccination that has been recorded Travel:  None beyond local counties in last few months.    PHYSICAL EXAM:  Vital signs in last 24 hours: Vitals:   01/11/19 0545 01/11/19 0953  BP: 98/64 (!) 102/55  Pulse: (!) 57 (!) 54  Resp: 17 16  Temp: 98.2 F (36.8 C) 98.1 F (36.7 C)  SpO2: 95% 98%   Wt Readings from Last 3 Encounters:  01/10/19 80.9 kg  01/10/19 83 kg  12/31/18 88.9 kg    General: Pleasant, well-appearing, alert AAF Head: No facial asymmetry or swelling.  No signs of head Eyes: No scleral icterus.  No conjunctival pallor. Ears: Not hard of hearing Nose: No congestion or discharge. Mouth: Oropharynx moist, pink, clear.  Tongue midline. Neck: No JVD, no masses, no thyromegaly. Lungs: Clear bilaterally.  No labored breathing or cough Heart: RRR.  No MRG.  S1, S2 present Abdomen: Soft.  Not tender or distended.  No HSM, masses, bruits, hernias..   Rectal: Deferred Musc/Skeltl: No joint swelling, redness, significant deformities Extremities: No CCE.  Feet are warm. Neurologic: Alert.  Oriented x3.  Moves all 4 limbs.  No tremor, no limb weakness. Skin: No rash, no sores, no suspicious lesions. Nodes: No cervical adenopathy. Psych: Pleasant, cooperative, calm.  Intake/Output from previous day: 03/19 0701 - 03/20 0700 In: 1629.2 [I.V.:629.2; IV Piggyback:1000] Out: 200 [Urine:200] Intake/Output this shift: No intake/output data recorded.  LAB RESULTS: Recent Labs    01/10/19 1640  WBC 9.5  HGB 16.1*  HCT 46.7*  PLT 282   BMET Lab Results  Component Value Date   NA 135 01/11/2019   NA 137 01/10/2019   NA 137 12/28/2018   K 3.6 01/11/2019   K 3.6 01/10/2019   K 3.8 12/28/2018   CL 106 01/11/2019   CL 103  01/10/2019   CL 103 12/28/2018   CO2 20 (L) 01/11/2019   CO2 18 (L) 01/10/2019   CO2 26 12/28/2018   GLUCOSE 103 (H) 01/11/2019   GLUCOSE 109 (H) 01/10/2019   GLUCOSE 105 (H) 12/28/2018   BUN 21 (H) 01/11/2019   BUN 23 (H) 01/10/2019   BUN 13 12/28/2018   CREATININE 0.86 01/11/2019   CREATININE 1.07 (H) 01/10/2019   CREATININE 0.84 12/28/2018    CALCIUM 9.3 01/11/2019   CALCIUM 10.6 (H) 01/10/2019   CALCIUM 9.8 12/28/2018   LFT Recent Labs    01/10/19 1640  PROT 7.4  ALBUMIN 4.4  AST 23  ALT 35  ALKPHOS 98  BILITOT 1.5*   PT/INR No results found for: INR, PROTIME Hepatitis Panel No results for input(s): HEPBSAG, HCVAB, HEPAIGM, HEPBIGM in the last 72 hours. C-Diff No components found for: CDIFF Lipase     Component Value Date/Time   LIPASE 82 (H) 01/10/2019 1640    Drugs of Abuse  No results found for: LABOPIA, COCAINSCRNUR, LABBENZ, AMPHETMU, THCU, LABBARB   RADIOLOGY STUDIES: Dg Esophagus W Single Cm (sol Or Thin Ba)  Result Date: 01/10/2019 CLINICAL DATA:  Dysphagia, weight loss.  Vomiting after eating. EXAM: ESOPHOGRAM/BARIUM SWALLOW TECHNIQUE: Single contrast examination was performed using  thin barium. FLUOROSCOPY TIME:  Fluoroscopy Time:  1 minutes 0 seconds Radiation Exposure Index (if provided by the fluoroscopic device): Number of Acquired Spot Images: 0 COMPARISON:  None. FINDINGS: Decreased esophageal motility. Negative for stricture or mass. Mild mucosal irregularity may be due to small sips versus esophagitis. No ulceration. Small hiatal hernia Barium tablet passed into the stomach without delay. IMPRESSION: Negative for stricture or mass Decreased esophageal motility.  Possible esophagitis. Electronically Signed   By: Franchot Gallo M.D.   On: 01/10/2019 18:11     IMPRESSION:   *     4 weeks of nausea, vomiting, unable to tolerate p.o., hiccups, dysphagia. Barium esophagram shows decreased esophageal motility but no stricture or obstructing mass. ?  Is all this related to GER?  Esophageal dysmotility should not cause nausea. The duration of the symptoms and her weight loss are worrisome.  However CT scan performed 2 weeks ago did not show any malignancies etc. Lipase is very mildly elevated, LFTs are normal, simple gallstone but no ductal abnormalities on the CT, could this be symptomatic  gallbladder disease with esophageal dysmotility represents an incidental finding/"red herring"?     PLAN:     *    EGD is set up for tomorrow morning. Continue scheduled protonix 40 IV bid, scheduled carafate.  Added scheduled Zofran IV q 6 hours   Azucena Freed  01/11/2019, 12:29 PM Phone [phone]

## 2019-01-11 NOTE — Consult Note (Addendum)
[facility] Consult: 12:29 PM 01/11/2019  LOS: 0 days    Referring Provider: Dr Fuller Plan  Primary Care Physician:  Billie Ruddy, MD Primary Gastroenterologist:  Dr. Verl Blalock    Reason for Consultation: Nausea, vomiting, difficulty swallowing.   HPI: [name] is a 58 y.o. female.  Past history of GERD, colon polyps, diverticulosis.   In 2013 she had similar GI problems and underwent 06/2012 EGD.  For hiccups and reflux symptoms.  Normal study.  5 cm hiatal hernia. The symptoms subsided with Protonix. 9/13 colonoscopy.  Descending and sigmoid diverticulosis, mild.  2, diminutive, sessile polyps in the sigmoid.  Otherwise normal study.  Pathology from the polyps was that of polypoid colorectal mucosa.  10-year follow-up colonoscopy suggested.  Patient takes OTC Zantac for heartburn.  May be 14 out of 28 days in a month.   She will get heartburn if she smells certain foods.  Sometimes the heartburn is triggered by foods. 4 weeks ago patient had sudden, acute onset of nausea, vomiting, hiccups and an element of dysphagia to both solids and liquids.  She is unable to keep anything down, vomits either foamy white or pale green watery liquid several times daily.  Neither the Zantac or antacids are helping.  She reports 30 pound weight loss.  Previously would have bowel movements daily, often with rapid bowel movements immediately after eating.  Lately she is having BMs every few days.  Her abdomen feels sore but she has been vomiting a lot, no deep visceral abdominal pain. Seen in the ED on 12/17/2018 and 12/28/2018 In February she was discharged on Reglan for possible viral gastroenteritis related nausea 12/28/2018 she was discharged on Thorazine for the hiccups, Tessalon Perles and Zantac.  There was  some mention of possible general surgery follow-up given gallstones found on CT abdomen pelvis but has not seen general surgery given that the gallstones were uncomplicated. [facility] and was sent over to the ED for further work-up and evaluation  Recent studies as follows 12/28/2018 CT abdomen pelvis with contrast which showed uncomplicated tiny gallstones, normal stomach, liver, pancreas, small bowel.  Colonic diverticulosis noted.. 01/10/2019 esophagram shows decreased esophageal motility but no stricture or mass.  There is mild mucosal irregularity.  No masses.  Small hiatal hernia.  Barium tablet passed into the stomach with no delay. Labs dating back to 3/6 to now show lipase going from 48 >> 82.  Bilirubin 1.1 >> 1.5.  Alkaline phosphatase and examination is normal. Lipase 48 >> 82.   Mild acute kidney injury yesterday, improved this morning. UBC is normal.  Hgb 16.1.   Past Medical History:  Diagnosis Date  . Allergy    uses Albuterol inhaler during allergy season  . Diverticulosis 2013   Colonoscopy  . GERD (gastroesophageal reflux disease)   . Hiatal hernia    CT abdomen in August 2013  . Hx of colonic polyps 2013   Colonoscopy   . Osteoarthritis    b/l hip, seen on  CT abd in 05/2012    Past Surgical History:  Procedure Laterality Date  . TONSILLECTOMY     childhood  . TOTAL ABDOMINAL HYSTERECTOMY W/ BILATERAL SALPINGOOPHORECTOMY  2008   Fibroids, done by Dr. Clovia Cuff at [facility]    Prior to Admission medications   Medication Sig Start Date End Date Taking? Authorizing Provider  albuterol (PROVENTIL HFA;VENTOLIN HFA) 108 (90 BASE) MCG/ACT inhaler Inhale 2 puffs into the lungs every 6 (six) hours as needed. For shortness of breath   Yes [provider]  aspirin 81 MG chewable tablet Chew 81 mg by mouth daily.   Yes [provider]  benzonatate (TESSALON) 100 MG capsule Take 1 capsule (100  mg total) by mouth every 8 (eight) hours. 12/28/18  Yes Deno Etienne, DO  metoCLOPramide (REGLAN) 10 MG tablet Take 1 tablet (10 mg total) by mouth every 6 (six) hours as needed for nausea (or hiccups). 5/57/32  Yes Delora Fuel, MD  Multiple Vitamin (MULTIVITAMIN WITH MINERALS) TABS tablet Take 1 tablet by mouth daily.   Yes [provider]  ranitidine (ZANTAC) 150 MG tablet Take 1 tablet (150 mg total) by mouth 2 (two) times daily. 12/28/18  Yes Deno Etienne, DO  chlorproMAZINE (THORAZINE) 25 MG tablet Take 1 tablet (25 mg total) by mouth 3 (three) times daily. Patient not taking: Reported on 01/10/2019 12/28/18   Deno Etienne, DO    Scheduled Meds: . aspirin  81 mg Oral Daily  . enoxaparin (LOVENOX) injection  40 mg Subcutaneous QHS  . metoCLOPramide  10 mg Oral TID AC  . multivitamin with minerals  1 tablet Oral Daily  . pantoprazole (PROTONIX) IV  40 mg Intravenous Q12H  . sucralfate  1 g Oral BID   Infusions: . sodium chloride 125 mL/hr at 01/10/19 2258   PRN Meds: acetaminophen **OR** acetaminophen, albuterol, benzonatate, ondansetron **OR** ondansetron (ZOFRAN) IV, senna-docusate   Allergies as of 01/10/2019  . (No Known Allergies)    Family History  Problem Relation Age of Onset  . Stroke Father        passed away at 48 yo of cerebal brain hemorrhage   . Hypertension Mother   . Hyperlipidemia Mother   . Colon cancer Neg Hx   . Stomach cancer Neg Hx   . Colon polyps Neg Hx   . Rectal cancer Neg Hx     Social History   Socioeconomic History  . Marital status: Single    Spouse name: Not on file  . Number of children: 3  . Years of education: 12th  . Highest education level: Not on file  Occupational History  . Occupation: ASS. CATHETERS    Employer: [employer]    Comment: layed off  Social Needs  . Financial resource strain: Not on file  . Food insecurity:    Worry: Not on file    Inability: Not on file  . Transportation needs:    Medical: Not on file     Non-medical: Not on file  Tobacco Use  . Smoking status: Former Smoker    Packs/day: 0.50    Years: 31.00    Pack years: 15.50    Types: Cigarettes  . Smokeless tobacco: Never Used  Substance and Sexual Activity  . Alcohol use: Not Currently    Alcohol/week: 2.0 standard drinks    Types: 2 Glasses of wine per week    Comment: 1-2 beers/week  . Drug use: No  . Sexual activity: Yes    Partners: Male  Comment: Same partner for 11 years  Lifestyle  . Physical activity:    Days per week: Not on file    Minutes per session: Not on file  . Stress: Not on file  Relationships  . Social connections:    Talks on phone: Not on file    Gets together: Not on file    Attends religious service: Not on file    Active member of club or organization: Not on file    Attends meetings of clubs or organizations: Not on file    Relationship status: Not on file  . Intimate partner violence:    Fear of current or ex partner: Not on file    Emotionally abused: Not on file    Physically abused: Not on file    Forced sexual activity: Not on file  Other Topics Concern  . Not on file  Social History Narrative   Live with her mother   Has three kids - all grown - all in [place] area   4 grandchildren   [place] at home - 11 years    REVIEW OF SYSTEMS: Constitutional: Feels tired and weak ENT:  No nose bleeds Pulm: No trouble breathing or cough. CV:  No palpitations, no LE edema.  No chest pain GU:  No hematuria, no frequency GI: See HPI Heme: Denies excessive or unusual bleeding or bruising. Transfusions: None Neuro:  No headaches, no peripheral tingling or numbness.   No syncope Derm:  No itching, no rash or sores.  Endocrine:  No sweats or chills.  No polyuria or dysuria Immunization: Immunization record review.  She had her Tdap vaccination in 2013 and that is the last vaccination that has been recorded Travel:  None beyond local counties in last few months.    PHYSICAL EXAM:  Vital signs in last 24 hours: Vitals:   01/11/19 0545 01/11/19 0953  BP: 98/64 (!) 102/55  Pulse: (!) 57 (!) 54  Resp: 17 16  Temp: 98.2 F (36.8 C) 98.1 F (36.7 C)  SpO2: 95% 98%   Wt Readings from Last 3 Encounters:  01/10/19 80.9 kg  01/10/19 83 kg  12/31/18 88.9 kg    General: Pleasant, well-appearing, alert AAF Head: No facial asymmetry or swelling.  No signs of head Eyes: No scleral icterus.  No conjunctival pallor. Ears: Not hard of hearing Nose: No congestion or discharge. Mouth: Oropharynx moist, pink, clear.  Tongue midline. Neck: No JVD, no masses, no thyromegaly. Lungs: Clear bilaterally.  No labored breathing or cough Heart: RRR.  No MRG.  S1, S2 present Abdomen: Soft.  Not tender or distended.  No HSM, masses, bruits, hernias..   Rectal: Deferred Musc/Skeltl: No joint swelling, redness, significant deformities Extremities: No CCE.  Feet are warm. Neurologic: Alert.  Oriented x3.  Moves all 4 limbs.  No tremor, no limb weakness. Skin: No rash, no sores, no suspicious lesions. Nodes: No cervical adenopathy. Psych: Pleasant, cooperative, calm.  Intake/Output from previous day: 03/19 0701 - 03/20 0700 In: 1629.2 [I.V.:629.2; IV Piggyback:1000] Out: 200 [Urine:200] Intake/Output this shift: No intake/output data recorded.  LAB RESULTS: Recent Labs    01/10/19 1640  WBC 9.5  HGB 16.1*  HCT 46.7*  PLT 282   BMET Lab Results  Component Value Date   NA 135 01/11/2019   NA 137 01/10/2019   NA 137 12/28/2018   K 3.6 01/11/2019   K 3.6 01/10/2019   K 3.8 12/28/2018   CL 106 01/11/2019   CL 103  01/10/2019   CL 103 12/28/2018   CO2 20 (L) 01/11/2019   CO2 18 (L) 01/10/2019   CO2 26 12/28/2018   GLUCOSE 103 (H) 01/11/2019   GLUCOSE 109 (H) 01/10/2019   GLUCOSE 105 (H) 12/28/2018   BUN 21 (H) 01/11/2019   BUN 23 (H) 01/10/2019   BUN 13 12/28/2018   CREATININE 0.86 01/11/2019   CREATININE 1.07 (H) 01/10/2019   CREATININE 0.84 12/28/2018    CALCIUM 9.3 01/11/2019   CALCIUM 10.6 (H) 01/10/2019   CALCIUM 9.8 12/28/2018   LFT Recent Labs    01/10/19 1640  PROT 7.4  ALBUMIN 4.4  AST 23  ALT 35  ALKPHOS 98  BILITOT 1.5*   PT/INR No results found for: INR, PROTIME Hepatitis Panel No results for input(s): HEPBSAG, HCVAB, HEPAIGM, HEPBIGM in the last 72 hours. C-Diff No components found for: CDIFF Lipase     Component Value Date/Time   LIPASE 82 (H) 01/10/2019 1640    Drugs of Abuse  No results found for: LABOPIA, COCAINSCRNUR, LABBENZ, AMPHETMU, THCU, LABBARB   RADIOLOGY STUDIES: Dg Esophagus W Single Cm (sol Or Thin Ba)  Result Date: 01/10/2019 CLINICAL DATA:  Dysphagia, weight loss.  Vomiting after eating. EXAM: ESOPHOGRAM/BARIUM SWALLOW TECHNIQUE: Single contrast examination was performed using  thin barium. FLUOROSCOPY TIME:  Fluoroscopy Time:  1 minutes 0 seconds Radiation Exposure Index (if provided by the fluoroscopic device): Number of Acquired Spot Images: 0 COMPARISON:  None. FINDINGS: Decreased esophageal motility. Negative for stricture or mass. Mild mucosal irregularity may be due to small sips versus esophagitis. No ulceration. Small hiatal hernia Barium tablet passed into the stomach without delay. IMPRESSION: Negative for stricture or mass Decreased esophageal motility.  Possible esophagitis. Electronically Signed   By: Franchot Gallo M.D.   On: 01/10/2019 18:11     IMPRESSION:   *     4 weeks of nausea, vomiting, unable to tolerate p.o., hiccups, dysphagia. Barium esophagram shows decreased esophageal motility but no stricture or obstructing mass. ?  Is all this related to GER?  Esophageal dysmotility should not cause nausea. The duration of the symptoms and her weight loss are worrisome.  However CT scan performed 2 weeks ago did not show any malignancies etc. Lipase is very mildly elevated, LFTs are normal, simple gallstone but no ductal abnormalities on the CT, could this be symptomatic  gallbladder disease with esophageal dysmotility represents an incidental finding/"red herring"?     PLAN:     *    EGD is set up for tomorrow morning. Continue scheduled protonix 40 IV bid, scheduled carafate.  Added scheduled Zofran IV q 6 hours   Azucena Freed  01/11/2019, 12:29 PM Phone [phone]

## 2019-01-11 NOTE — Care Management (Signed)
Patient follows at [facility] with Dr. Billie Ruddy, MD. Patient can utilize [facility] for her Rx needs with discounted Rx $4-$10.   Midge Minium RN, BSN, NCM-BC, ACM-RN [phone]

## 2019-01-12 ENCOUNTER — Inpatient Hospital Stay (HOSPITAL_COMMUNITY): Payer: Self-pay | Admitting: Anesthesiology

## 2019-01-12 ENCOUNTER — Encounter (HOSPITAL_COMMUNITY): Payer: Self-pay | Admitting: Gastroenterology

## 2019-01-12 ENCOUNTER — Encounter (HOSPITAL_COMMUNITY)
Admission: EM | Disposition: A | Discharge status: Home / Self Care | Payer: Self-pay | Source: Home / Self Care | Attending: Internal Medicine

## 2019-01-12 DIAGNOSIS — K449 Diaphragmatic hernia without obstruction or gangrene: Secondary | ICD-10-CM

## 2019-01-12 DIAGNOSIS — K2970 Gastritis, unspecified, without bleeding: Secondary | ICD-10-CM

## 2019-01-12 HISTORY — PX: ESOPHAGOGASTRODUODENOSCOPY (EGD) WITH PROPOFOL: SHX5813

## 2019-01-12 HISTORY — PX: BIOPSY: SHX5522

## 2019-01-12 HISTORY — PX: MALONEY DILATION: SHX5535

## 2019-01-12 SURGERY — ESOPHAGOGASTRODUODENOSCOPY (EGD) WITH PROPOFOL
Anesthesia: Monitor Anesthesia Care

## 2019-01-12 MED ORDER — PROPOFOL 500 MG/50ML IV EMUL
INTRAVENOUS | Status: DC | PRN
  Administered 2019-01-12: 100 ug/kg/min via INTRAVENOUS

## 2019-01-12 MED ORDER — BISACODYL 10 MG RE SUPP: 10.0000 mg | Freq: Every day | RECTAL | Status: DC | PRN

## 2019-01-12 MED ORDER — POLYETHYLENE GLYCOL 3350 17 G PO PACK
17.0000 g | PACK | Freq: Two times a day (BID) | ORAL | Status: DC
  Administered 2019-01-12: 17 g via ORAL
  Filled 2019-01-12 (×2): qty 1

## 2019-01-12 MED ORDER — SENNA 8.6 MG PO TABS
2.0000 | ORAL_TABLET | Freq: Every day | ORAL | Status: DC
  Administered 2019-01-12: 17.2 mg via ORAL
  Filled 2019-01-12 (×2): qty 2

## 2019-01-12 MED ORDER — BOOST / RESOURCE BREEZE PO LIQD CUSTOM
1.0000 | Freq: Two times a day (BID) | ORAL | Status: DC
  Administered 2019-01-12 – 2019-01-13 (×2): 1 via ORAL
  Filled 2019-01-12 (×3): qty 1

## 2019-01-12 MED ORDER — PROPOFOL 10 MG/ML IV BOLUS
INTRAVENOUS | Status: DC | PRN
  Administered 2019-01-12 (×2): 20 mg via INTRAVENOUS
  Administered 2019-01-12 (×2): 10 mg via INTRAVENOUS

## 2019-01-12 MED ORDER — ENSURE MAX PROTEIN PO LIQD
11.0000 [oz_av] | Freq: Two times a day (BID) | ORAL | Status: DC
  Administered 2019-01-12 – 2019-01-13 (×2): 11 [oz_av] via ORAL
  Filled 2019-01-12 (×2): qty 330

## 2019-01-12 SURGICAL SUPPLY — 15 items

## 2019-01-12 NOTE — Interval H&P Note (Signed)
History and Physical Interval Note:  01/12/2019 9:39 AM  [name]  has presented today for surgery, with the diagnosis of dysphagia, nausea, vomiting.  The various methods of treatment have been discussed with the patient and family. After consideration of risks, benefits and other options for treatment, the patient has consented to  Procedure(s): ESOPHAGOGASTRODUODENOSCOPY (EGD) WITH PROPOFOL (N/A) SAVORY DILATION (N/A) as a surgical intervention.  The patient's history has been reviewed, patient examined, no change in status, stable for surgery.  I have reviewed the patient's chart and labs.  Questions were answered to the patient's satisfaction.     Jackquline Denmark

## 2019-01-12 NOTE — Anesthesia Preprocedure Evaluation (Addendum)
Anesthesia Evaluation  Patient identified by MRN, date of birth, ID band Patient awake    Reviewed: Allergy & Precautions, H&P , NPO status , Patient's Chart, lab work & pertinent test results  Airway Mallampati: II  TM Distance: >3 FB Neck ROM: Full    Dental no notable dental hx. (+) Teeth Intact, Dental Advisory Given   Pulmonary neg pulmonary ROS, former smoker,    Pulmonary exam normal breath sounds clear to auscultation       Cardiovascular negative cardio ROS   Rhythm:Regular Rate:Normal     Neuro/Psych negative neurological ROS  negative psych ROS   GI/Hepatic Neg liver ROS, hiatal hernia, GERD  Medicated,  Endo/Other  negative endocrine ROS  Renal/GU negative Renal ROS  negative genitourinary   Musculoskeletal  (+) Arthritis ,   Abdominal   Peds  Hematology negative hematology ROS (+)   Anesthesia Other Findings   Reproductive/Obstetrics negative OB ROS                            Anesthesia Physical Anesthesia Plan  ASA: II  Anesthesia Plan: MAC   Post-op Pain Management:    Induction: Intravenous  PONV Risk Score and Plan: 2 and Propofol infusion and Ondansetron  Airway Management Planned: Nasal Cannula  Additional Equipment:   Intra-op Plan:   Post-operative Plan:   Informed Consent: I have reviewed the patients History and Physical, chart, labs and discussed the procedure including the risks, benefits and alternatives for the proposed anesthesia with the patient or authorized representative who has indicated his/her understanding and acceptance.     Dental advisory given  Plan Discussed with: CRNA  Anesthesia Plan Comments:         Anesthesia Quick Evaluation

## 2019-01-12 NOTE — Transfer of Care (Signed)
Immediate Anesthesia Transfer of Care Note  Patient: [name]  Procedure(s) Performed: ESOPHAGOGASTRODUODENOSCOPY (EGD) WITH PROPOFOL (N/A ) BIOPSY MALONEY DILATION  Patient Location: Endoscopy Unit  Anesthesia Type:MAC  Level of Consciousness: awake, alert  and oriented  Airway & Oxygen Therapy: Patient Spontanous Breathing  Post-op Assessment: Report given to RN and Post -op Vital signs reviewed and stable  Post vital signs: Reviewed and stable  Last Vitals:  Vitals Value Taken Time  BP    Temp    Pulse 55 01/12/2019 10:10 AM  Resp 17 01/12/2019 10:10 AM  SpO2 100 % 01/12/2019 10:10 AM  Vitals shown include unvalidated device data.  Last Pain:  Vitals:   01/12/19 0842  TempSrc: Oral  PainSc: 6          Complications: No apparent anesthesia complications

## 2019-01-12 NOTE — Op Note (Addendum)
[facility] Patient Name: [name] Procedure Date : 01/12/2019 MRN: [id number] Attending MD: Jackquline Denmark , MD Date of Birth: [birth year] CSN: [id number] Age: 58 Admit Type: Inpatient Procedure:                Upper GI endoscopy Indications:              Dysphagia, nausea/vomiting, weight loss with                            negative CT Abdo/pelvis. Providers:                Jackquline Denmark, MD, Elna Breslow, RN, Elspeth Cho                            Tech., Technician, Clearnce Sorrel, CRNA Referring MD:              Medicines:                Monitored Anesthesia Care Complications:            No immediate complications. Estimated Blood Loss:     Estimated blood loss: none. Procedure:                Pre-Anesthesia Assessment:                           - Prior to the procedure, a History and Physical                            was performed, and patient medications and                            allergies were reviewed. The patient's tolerance of                            previous anesthesia was also reviewed. The risks                            and benefits of the procedure and the sedation                            options and risks were discussed with the patient.                            All questions were answered, and informed consent                            was obtained. Prior Anticoagulants: The patient has                            taken no previous anticoagulant or antiplatelet                            agents. ASA Grade Assessment: II - A patient with  mild systemic disease. After reviewing the risks                            and benefits, the patient was deemed in                            satisfactory condition to undergo the procedure.                           After obtaining informed consent, the endoscope was                            passed under direct vision. Throughout the                            procedure,  the patient's blood pressure, pulse, and                            oxygen saturations were monitored continuously. The                            GIF-H190 ([id number]) Olympus gastroscope was                            introduced through the mouth, and advanced to the                            second part of duodenum. The upper GI endoscopy was                            accomplished without difficulty. The patient                            tolerated the procedure well. Scope In: Scope Out: Findings:      The examined esophagus was mildly tortuous but normal with Z-line at 38       cm. Biopsies were obtained from the proximal and distal esophagus with       cold forceps for histology of suspected eosinophilic esophagitis.       Estimated blood loss: none. The scope was withdrawn. Dilation was       performed with a Maloney dilator with mild resistance at 50 Fr.       Estimated blood loss: none.      A small hiatal hernia was present.      Localized mild inflammation characterized by erythema was found in the       gastric antrum. Biopsies were taken with a cold forceps for histology.       Estimated blood loss: none.      Mild duodenitis in the first portion of the duodenum. It was otherwise       unremarkable. Biopsies for histology were taken with a cold forceps for       evaluation of celiac disease. Estimated blood loss: none. Impression:               - Small hiatal hernia.                           -  Mild gastroduodenitis.                           - S/p empiric esophageal dilatation. Moderate Sedation:      none Recommendation:           - Patient has a contact number available for                            emergencies. The signs and symptoms of potential                            delayed complications were discussed with the                            patient. Return to normal activities tomorrow.                            Written discharge instructions were provided to the                             patient.                           - Soft diet today.                           - Protonix 40 mg p.o. every morning x 12 weeks,                            Zantac 150 milligrams p.o. nightly x 4 weeks.                           - Can resume Reglan as she has been doing                            previously.                           - Await pathology results.                           - Monitor weight. Check her weight every week and                            record it. She is to report if there is any further                            weight loss.                           - Return to GI clinic in 12 weeks.                           - Discussed above with the patient and patient's  family.                           - Will sign off for now. Procedure Code(s):        --- Professional ---                           [phone], Esophagogastroduodenoscopy, flexible,                            transoral; with biopsy, single or multiple                           43450, Dilation of esophagus, by unguided sound or                            bougie, single or multiple passes Diagnosis Code(s):        --- Professional ---                           K44.9, Diaphragmatic hernia without obstruction or                            gangrene                           K29.70, Gastritis, unspecified, without bleeding                           R13.10, Dysphagia, unspecified CPT copyright 2018 American Medical Association. All rights reserved. The codes documented in this report are preliminary and upon coder review may  be revised to meet current compliance requirements. Jackquline Denmark, MD 01/12/2019 10:13:04 AM This report has been signed electronically. Number of Addenda: 0

## 2019-01-12 NOTE — Progress Notes (Signed)
Initial Nutrition Assessment  DOCUMENTATION CODES:  Severe malnutrition in context of acute illness/injury  INTERVENTION:  Ensure Max protein supplement BID, each supplement provides 150kcal and 30g of protein.  Boost Breeze po BID, each supplement provides 250 kcal and 9 grams of protein   NUTRITION DIAGNOSIS:  Severe Malnutrition related to dysphagia, nausea, vomiting as evidenced by a loss of >5% bw in <1 month and an estimated oral intake that has met </= to 50% of needs for >/= to 5 days.   GOAL:  Patient will meet greater than or equal to 90% of their needs  MONITOR:  PO intake, Supplement acceptance, Labs, I & O's, Skin, Diet advancement  REASON FOR ASSESSMENT:  Malnutrition Screening Tool    ASSESSMENT:  58 y/o female PMHx GERD, hld. Presents with difficulty swallowing x1 month and associated nausea, vomiting. Reports loss of 30 lbs x1 month. In ED, was found to be clinically dehydrated. Admitted for iv hydration and further investigation of dysphagia  Patient reports general dysphagia x1 month. There are no specific textures or consistencies she does better or worse with. No matter what she eats, she nearly always experiences n/v shortly after. She says her sense of taste has intensified to the point that food isnt edible. She has had associated constipation- likely d/t dehydration. Her bowels usually are quite regular.  In terms of supplements, she says she was drinking 1 Boost supplement a day, but struggled to keep these down d/t there "chalkiness". She takes a 1-a-day mvi.   Weight wise, she reports a UBW of 214 lbs. She was weighed at 196 at her family medicine doctor earlier this month. She is currently listed as 178.3 lbs, a loss of 15-20 lbs in 2-3 weeks. This degree of loss meets malnutrition criteria.   At this time, she is s/p EGD w/ esoph dilation. Unfortunately, she ate lunch after her procedure and reports still suffering from the same issues. She has only been  able to eat some applesauce and vanilla pudding. RD recommended some alternative oral supplements. Recommended Ensure max as it contains less fat and should be less "chalky". Similiarly, Boost breeze should be better tolerated . She was agreeable to these.   Labs: Unremarkable after rehydration Meds: Reglan, Zofran, MVI with minerals, Miralax, ppi, IVF, senokot  Recent Labs  Lab 01/10/19 1640 01/11/19 0218  NA 137 135  K 3.6 3.6  CL 103 106  CO2 18* 20*  BUN 23* 21*  CREATININE 1.07* 0.86  CALCIUM 10.6* 9.3  GLUCOSE 109* 103*   NUTRITION - FOCUSED PHYSICAL EXAM:   Most Recent Value  Orbital Region  Mild depletion  Upper Arm Region  No depletion  Thoracic and Lumbar Region  No depletion  Buccal Region  Moderate depletion  Temple Region  Mild depletion  Clavicle Bone Region  Mild depletion  Clavicle and Acromion Bone Region  Mild depletion  Dorsal Hand  No depletion  Patellar Region  No depletion  Anterior Thigh Region  Unable to assess  Posterior Calf Region  Mild depletion  Edema (RD Assessment)  None  Hair  Reviewed  Eyes  Reviewed  Mouth  Reviewed  Skin  Reviewed  Nails  Reviewed     Diet Order:   Diet Order            DIET SOFT Room service appropriate? Yes; Fluid consistency: Thin  Diet effective now             EDUCATION NEEDS:  No education needs  have been identified at this time  Skin:  Skin Assessment: Reviewed RN Assessment  Last BM:  3/18  Height:  Ht Readings from Last 1 Encounters:  01/12/19 _0  (1.702 m)   Weight:  Wt Readings from Last 1 Encounters:  01/12/19 86.2 kg   Wt Readings from Last 10 Encounters:  01/12/19 86.2 kg  01/10/19 83 kg  12/31/18 88.9 kg  12/16/18 90.7 kg  07/13/15 109.1 kg  07/09/15 106.1 kg  05/01/13 102.8 kg  04/24/13 90.7 kg  11/19/12 95.3 kg  11/13/12 104.6 kg    Ideal Body Weight:  63.64 kg  BMI:  Body mass index is 29.76 kg/m.  Estimated Nutritional Needs:  Kcal:  1800-2000 kcals (21-23 kcal/kg  bw) Protein:  83-100 (1-1.2 g/kg bw) Fluid:  1.8-2L fluid (58m/kcal)  NBurtis JunesRD, LDN, CNSC Clinical Nutrition Available Tues-Sat via Pager: [phone]/21/2020 5:57 PM

## 2019-01-12 NOTE — Progress Notes (Signed)
PROGRESS NOTE   [name]  VQQ:[id number]    DOB: 1961-02-22    DOA: 01/10/2019  PCP: Billie Ruddy, MD   I have briefly reviewed patients previous medical records in [facility].  Brief Narrative:  58 year old married female, PMH of GERD, diverticulosis, HLD presented to ED with approximately 1 month history of difficulty swallowing to both solids and liquids, nausea and vomiting, unable to tolerate p.o., 30 pound weight loss, chronic hiccups, seen twice in the ED on 2/24 and 3/6, underwent CT of the abdomen and pelvis on 3/6 that showed no acute issues to explain her symptoms, prescribed Reglan and Thorazine but stopped taking Thorazine due to palpitations, admitted for further evaluation and management.  Barium swallow showed no stricture or mass but showed decreased esophageal motility, possible esophagitis.  [facility] consulted, underwent EGD 3/21 that showed small hiatal hernia, mild gastroduodenitis and underwent empiric esophageal dilatation.   Assessment & Plan:   Principal Problem:   Difficulty swallowing Active Problems:   GERD (gastroesophageal reflux disease)   Dehydration   Hypercalcemia   Sinus bradycardia   LLQ abdominal pain   1. Dysphagia/nausea and vomiting: CT abdomen, barium swallow results as above.   [facility] consulted, underwent EGD 3/21 that showed small hiatal hernia, mild gastroduodenitis and underwent empiric esophageal dilatation.  They recommend PPI and Zantac as below, soft diet.  Monitor.  Still no obvious etiology for her symptoms, not sure if mild gastroduodenitis could explain her symptoms. 2. GERD/small hiatal hernia: Continue PPI and H2 blocker. 3. Constipation: Aggressive bowel regimen and monitor. 4. Asymptomatic sinus bradycardia TSH normal, free T4: 1.16.  Not on negative chronotropic medicines. 5. Hyperglycemia: Mild, probably related to dehydration, resolved. 6. Profound weight loss: Possibly related to problem #1.  Close  outpatient monitoring.  Underwent EGD as above.  Last colonoscopy in 2013 and due in 2023, may need to repeat earlier.   DVT prophylaxis: Lovenox Code Status: Full Family Communication: Discussed with spouse at bedside Disposition: Home pending clinical improvement and tolerating diet, hopefully 3/28   Consultants:  [facility]  Procedures:  EGD 3/21 Impression Small hiatal hernia, mild gastroduodenitis and underwent empiric esophageal dilatation.  Recommendation Soft diet Protonix 40 mg daily for 12 weeks, Zantac 150 mg nightly for 4 weeks Can resume Reglan as she was previously doing Await pathology results Weekly weight check to monitor for further weight loss. Return to GI clinic in 12 weeks.  Antimicrobials:  None   Subjective: Seen this morning after procedure.  Denied complaints.  No abdominal pain.  Reports constipation to no BM in the last 3 to 4 days.  ROS: As above, otherwise negative  Objective:  Vitals:   01/12/19 0702 01/12/19 0842 01/12/19 1011 01/12/19 1020  BP: 112/69 122/67 123/71 135/70  Pulse: (!) 48 (!) 48 (!) 55 (!) 50  Resp: 18 15 17 12   Temp: 98.2 F (36.8 C) 98.5 F (36.9 C) 98.1 F (36.7 C)   TempSrc: Oral Oral Oral   SpO2: 99% 99% 100% 100%  Weight: 86.2 kg 86.2 kg    Height:  5\' 7"  (1.702 m)      Examination:  General exam: Pleasant young female, moderately built and nourished lying comfortably propped up in bed.  Oral mucosa moist. Respiratory system: Clear to auscultation. Respiratory effort normal. Cardiovascular system: S1 & S2 heard, RRR. No JVD, murmurs, rubs, gallops or clicks. No pedal edema. Gastrointestinal system: Abdomen is nondistended, soft and nontender. No organomegaly or masses felt.  Normal bowel sounds heard. Central nervous system: Alert and oriented. No focal neurological deficits. Extremities: Symmetric 5 x 5 power. Skin: No rashes, lesions or ulcers Psychiatry: Judgement and insight appear normal. Mood &  affect appropriate.     Data Reviewed: I have personally reviewed following labs and imaging studies  CBC: Recent Labs  Lab 01/10/19 1640  WBC 9.5  NEUTROABS 6.5  HGB 16.1*  HCT 46.7*  MCV 84.6  PLT 476   Basic Metabolic Panel: Recent Labs  Lab 01/10/19 1640 01/11/19 0218  NA 137 135  K 3.6 3.6  CL 103 106  CO2 18* 20*  GLUCOSE 109* 103*  BUN 23* 21*  CREATININE 1.07* 0.86  CALCIUM 10.6* 9.3   Liver Function Tests: Recent Labs  Lab 01/10/19 1640  AST 23  ALT 35  ALKPHOS 98  BILITOT 1.5*  PROT 7.4  ALBUMIN 4.4      Radiology Studies: Dg Esophagus W Single Cm (sol Or Thin Ba)  Result Date: 01/10/2019 CLINICAL DATA:  Dysphagia, weight loss.  Vomiting after eating. EXAM: ESOPHOGRAM/BARIUM SWALLOW TECHNIQUE: Single contrast examination was performed using  thin barium. FLUOROSCOPY TIME:  Fluoroscopy Time:  1 minutes 0 seconds Radiation Exposure Index (if provided by the fluoroscopic device): Number of Acquired Spot Images: 0 COMPARISON:  None. FINDINGS: Decreased esophageal motility. Negative for stricture or mass. Mild mucosal irregularity may be due to small sips versus esophagitis. No ulceration. Small hiatal hernia Barium tablet passed into the stomach without delay. IMPRESSION: Negative for stricture or mass Decreased esophageal motility.  Possible esophagitis. Electronically Signed   By: Franchot Gallo M.D.   On: 01/10/2019 18:11        Scheduled Meds: . aspirin  81 mg Oral Daily  . enoxaparin (LOVENOX) injection  40 mg Subcutaneous QHS  . metoCLOPramide  10 mg Oral TID AC  . multivitamin with minerals  1 tablet Oral Daily  . ondansetron  4 mg Intravenous Q6H  . pantoprazole (PROTONIX) IV  40 mg Intravenous Q12H  . sucralfate  1 g Oral BID   Continuous Infusions: . sodium chloride 125 mL/hr at 01/12/19 1129     LOS: 1 day     Vernell Leep, MD, [place], [facility]. [facility]  To contact the attending provider between 7A-7P or the covering  provider during after hours 7P-7A, please log into the web site [email or web address] and access using universal [place] password for that web site. If you do not have the password, please call the hospital operator.  01/12/2019, 4:14 PM

## 2019-01-12 NOTE — Anesthesia Postprocedure Evaluation (Signed)
Anesthesia Post Note  Patient: [name]  Procedure(s) Performed: ESOPHAGOGASTRODUODENOSCOPY (EGD) WITH PROPOFOL (N/A ) [place]     Patient location during evaluation: Endoscopy Anesthesia Type: MAC Level of consciousness: awake and alert Pain management: pain level controlled Vital Signs Assessment: post-procedure vital signs reviewed and stable Respiratory status: spontaneous breathing, nonlabored ventilation and respiratory function stable Cardiovascular status: stable and blood pressure returned to baseline Postop Assessment: no apparent nausea or vomiting Anesthetic complications: no    Last Vitals:  Vitals:   01/12/19 1011 01/12/19 1020  BP: 123/71 135/70  Pulse: (!) 55 (!) 50  Resp: 17 12  Temp: 36.7 C   SpO2: 100% 100%    Last Pain:  Vitals:   01/12/19 1020  TempSrc:   PainSc: 0-No pain                 Morene Cecilio,W. EDMOND

## 2019-01-13 DIAGNOSIS — E43 Unspecified severe protein-calorie malnutrition: Secondary | ICD-10-CM

## 2019-01-13 LAB — CBC
HCT: 36.9 % (ref 36.0–46.0)
Hemoglobin: 12 g/dL (ref 12.0–15.0)
MCH: 28.1 pg (ref 26.0–34.0)
MCHC: 32.5 g/dL (ref 30.0–36.0)
MCV: 86.4 fL (ref 80.0–100.0)
Platelets: 203 10*3/uL (ref 150–400)
RBC: 4.27 MIL/uL (ref 3.87–5.11)
RDW: 12.4 % (ref 11.5–15.5)
WBC: 7.9 10*3/uL (ref 4.0–10.5)
nRBC: 0 % (ref 0.0–0.2)

## 2019-01-13 MED ORDER — POLYETHYLENE GLYCOL 3350 17 G PO PACK: 17.0000 g | PACK | Freq: Every day | ORAL | 0 refills | Status: DC

## 2019-01-13 MED ORDER — PANTOPRAZOLE SODIUM 40 MG PO TBEC
40.0000 mg | DELAYED_RELEASE_TABLET | Freq: Every day | ORAL | Status: DC
  Administered 2019-01-13: 40 mg via ORAL
  Filled 2019-01-13: qty 1

## 2019-01-13 MED ORDER — ONDANSETRON HCL 4 MG PO TABS: 4.0000 mg | ORAL_TABLET | Freq: Three times a day (TID) | ORAL | 0 refills | Status: DC | PRN

## 2019-01-13 MED ORDER — PANTOPRAZOLE SODIUM 40 MG PO TBEC: 40.0000 mg | DELAYED_RELEASE_TABLET | Freq: Every day | ORAL | 0 refills | Status: DC

## 2019-01-13 MED ORDER — SENNA 8.6 MG PO TABS: 1.0000 | ORAL_TABLET | Freq: Every day | ORAL | 0 refills | Status: DC | PRN

## 2019-01-13 MED ORDER — RANITIDINE HCL 150 MG PO TABS: 150.0000 mg | ORAL_TABLET | Freq: Every day | ORAL | 0 refills | Status: DC

## 2019-01-13 MED ORDER — FAMOTIDINE 20 MG PO TABS: 20.0000 mg | ORAL_TABLET | Freq: Every day | ORAL | Status: DC

## 2019-01-13 MED ORDER — BENZONATATE 100 MG PO CAPS: 100.0000 mg | ORAL_CAPSULE | Freq: Three times a day (TID) | ORAL | Status: DC | PRN

## 2019-01-13 NOTE — Discharge Instructions (Signed)

## 2019-01-13 NOTE — Discharge Summary (Addendum)
Physician Discharge Summary  [name] QQI:[id number] DOB: October 08, 1961  PCP: Billie Ruddy, MD  Admit date: 01/10/2019 Discharge date: 01/13/2019  Recommendations for Outpatient Follow-up:  1. Dr. Grier Mitts, PCP in 1 week with repeat labs (CBC & BMP). 2. Dr. Brimson Cellar, [facility] in 12 weeks.  Home Health: None Equipment/Devices: None  Discharge Condition: Improved and stable CODE STATUS: Full Diet recommendation: Heart healthy diet.  Discharge Diagnoses:  Principal Problem:   Difficulty swallowing Active Problems:   GERD (gastroesophageal reflux disease)   Dehydration   Hypercalcemia   Sinus bradycardia   LLQ abdominal pain   Protein-calorie malnutrition, severe   Brief Summary: 58 year old married female, PMH of GERD, diverticulosis, HLD presented to ED with approximately 1 month history of difficulty swallowing to both solids and liquids, nausea and vomiting, unable to tolerate p.o., 30 pound weight loss, chronic hiccups, seen twice in the ED on 2/24 and 3/6, underwent CT of the abdomen and pelvis on 3/6 that showed no acute issues to explain her symptoms, prescribed Reglan and Thorazine but stopped taking Thorazine due to palpitations, admitted for further evaluation and management.  Barium swallow showed no stricture or mass but showed decreased esophageal motility, possible esophagitis.  [facility] consulted, underwent EGD 3/21 that showed small hiatal hernia, mild gastroduodenitis and underwent empiric esophageal dilatation.   Assessment & Plan:   1. Dysphagia/nausea and vomiting: CT abdomen, barium swallow results as above.   [facility] consulted, underwent EGD 3/21 that showed small hiatal hernia, mild gastroduodenitis and underwent empiric esophageal dilatation.  They recommend PPI and Zantac as below, soft diet. Still no obvious etiology for her symptoms, not sure if mild gastroduodenitis could explain her symptoms.  Clinically improved.  No further  vomiting.  Occasional nausea.  Tolerating diet.  I discussed with Dr. Lyndel Safe, recommends continuing Protonix 40 mg daily indefinitely, Zantac 150 at bedtime for 4 weeks, Zofran as needed for nausea, discontinue Reglan at discharge.  GI to follow pathology results. 2. GERD/small hiatal hernia: Continue PPI and H2 blocker. 3. Constipation:  Started bowel regimen and patient has had BMs last night.  Continue bowel regimen at discharge. 4. Asymptomatic sinus bradycardia TSH normal, free T4: 1.16.  Not on negative chronotropic medicines. 5. Hypercalcemia: Mild, probably related to dehydration, resolved. 6. Profound weight loss: Possibly related to problem #1.  Close outpatient monitoring.  Underwent EGD as above.  Last colonoscopy in 2013 and due in 2023, may need to repeat earlier. 7. Cholelithiasis: Seen on CT abdomen.  Asymptomatic.  However if patient has recurrent GI symptoms that brought her to the hospital, may need to consider further evaluation of this as cause. 8. Severe malnutrition in context of acute illness/injury: Seen by dietitian in the hospital.  Diet as tolerated at home she is symptomatically better.    Consultants:  [facility]  Procedures:  EGD 3/21 Impression Small hiatal hernia, mild gastroduodenitis and underwent empiric esophageal dilatation.  Recommendation Soft diet Protonix 40 mg daily for 12 weeks, Zantac 150 mg nightly for 4 weeks Can resume Reglan as she was previously doing Await pathology results Weekly weight check to monitor for further weight loss. Return to GI clinic in 12 weeks.   Discharge Instructions  Discharge Instructions    Call MD for:  extreme fatigue   Complete by:  As directed    Call MD for:  persistant dizziness or light-headedness   Complete by:  As directed    Call MD for:  persistant nausea and  vomiting   Complete by:  As directed    Call MD for:  severe uncontrolled pain   Complete by:  As directed    Diet - low sodium heart  healthy   Complete by:  As directed    Increase activity slowly   Complete by:  As directed        Medication List    STOP taking these medications   chlorproMAZINE 25 MG tablet Commonly known as:  THORAZINE   metoCLOPramide 10 MG tablet Commonly known as:  REGLAN     TAKE these medications   albuterol 108 (90 Base) MCG/ACT inhaler Commonly known as:  PROVENTIL HFA;VENTOLIN HFA Inhale 2 puffs into the lungs every 6 (six) hours as needed. For shortness of breath   aspirin 81 MG chewable tablet Chew 81 mg by mouth daily.   benzonatate 100 MG capsule Commonly known as:  TESSALON Take 1 capsule (100 mg total) by mouth 3 (three) times daily as needed for cough. What changed:    when to take this  reasons to take this   multivitamin with minerals Tabs tablet Take 1 tablet by mouth daily.   ondansetron 4 MG tablet Commonly known as:  ZOFRAN Take 1 tablet (4 mg total) by mouth every 8 (eight) hours as needed for nausea or vomiting.   pantoprazole 40 MG tablet Commonly known as:  PROTONIX Take 1 tablet (40 mg total) by mouth daily. Start taking on:  January 14, 2019   polyethylene glycol packet Commonly known as:  MIRALAX / GLYCOLAX Take 17 g by mouth daily.   ranitidine 150 MG tablet Commonly known as:  ZANTAC Take 1 tablet (150 mg total) by mouth at bedtime for 28 days. What changed:  when to take this   senna 8.6 MG Tabs tablet Commonly known as:  SENOKOT Take 1 tablet (8.6 mg total) by mouth daily as needed for mild constipation or moderate constipation.      Follow-up Information    Billie Ruddy, MD. Schedule an appointment as soon as possible for a visit in 1 week(s).   Specialty:  Family Medicine Why:  To be seen with repeat labs (CBC & BMP). Contact information: [place] [address] [phone]        Yetta Flock, MD. Schedule an appointment as soon as possible for a visit in 3 month(s).   Specialty:   Gastroenterology Contact information: [address] [address] [phone]          No Known Allergies    Procedures/Studies: Ct Abdomen Pelvis W Contrast  Result Date: 12/28/2018 CLINICAL DATA:  Nausea, vomiting, abdominal pain, decreased bowel movements and decreased flatus. EXAM: CT ABDOMEN AND PELVIS WITH CONTRAST TECHNIQUE: Multidetector CT imaging of the abdomen and pelvis was performed using the standard protocol following bolus administration of intravenous contrast. CONTRAST:  128mL ISOVUE-300 IOPAMIDOL (ISOVUE-300) INJECTION 61% COMPARISON:  06/05/2012. FINDINGS: Lower chest: Minimal bilateral dependent atelectasis. Hepatobiliary: Tiny gallstones in the dependent portion of the gallbladder. No gallbladder wall thickening or pericholecystic fluid. Unremarkable liver. Pancreas: Unremarkable. No pancreatic ductal dilatation or surrounding inflammatory changes. Spleen: Normal in size without focal abnormality. Adrenals/Urinary Tract: Normal appearing adrenal glands. Small bilateral renal cysts. Unremarkable urinary bladder and ureters. Stomach/Bowel: Small number of colonic diverticula without evidence of diverticulitis. Normal appearing stomach, small bowel and appendix. Vascular/Lymphatic: Atheromatous arterial calcifications without aneurysm. No enlarged lymph nodes. Reproductive: Status post hysterectomy. No adnexal masses. Other: No abdominal  wall hernia or abnormality. No abdominopelvic ascites. Musculoskeletal: Multiple small bone islands. Mild bilateral hip degenerative changes. Lumbar and lower thoracic spine degenerative changes. IMPRESSION: 1. No acute abnormality. 2. Mild colonic diverticulosis. 3. Cholelithiasis. Electronically Signed   By: Claudie Revering M.D.   On: 12/28/2018 14:40   Dg Esophagus W Single Cm (sol Or Thin Ba)  Result Date: 01/10/2019 CLINICAL DATA:  Dysphagia, weight loss.  Vomiting after eating. EXAM: ESOPHOGRAM/BARIUM SWALLOW TECHNIQUE:  Single contrast examination was performed using  thin barium. FLUOROSCOPY TIME:  Fluoroscopy Time:  1 minutes 0 seconds Radiation Exposure Index (if provided by the fluoroscopic device): Number of Acquired Spot Images: 0 COMPARISON:  None. FINDINGS: Decreased esophageal motility. Negative for stricture or mass. Mild mucosal irregularity may be due to small sips versus esophagitis. No ulceration. Small hiatal hernia Barium tablet passed into the stomach without delay. IMPRESSION: Negative for stricture or mass Decreased esophageal motility.  Possible esophagitis. Electronically Signed   By: Franchot Gallo M.D.   On: 01/10/2019 18:11      Subjective: Patient interviewed and examined along with spouse at bedside.  Feels much better.  No vomiting since prior to procedure yesterday.  Tolerating diet well.  Mild intermittent nausea.  Had multiple BMs last night.  Anxious to go home.  Discharge Exam:  Vitals:   01/12/19 1714 01/12/19 1938 01/13/19 0438 01/13/19 0933  BP: 123/72 102/61 111/66 108/73  Pulse: 60 (!) 55 (!) 57 64  Resp: 18 17 18 19   Temp:  98.8 F (37.1 C) 98.7 F (37.1 C) 98 F (36.7 C)  TempSrc:  Oral Oral Oral  SpO2: 100% 98% 99% 100%  Weight:  88.2 kg    Height:        General exam: Pleasant young female, moderately built and nourished sitting up comfortably in bed.  Oral mucosa moist. Respiratory system: Clear to auscultation. Respiratory effort normal. Cardiovascular system: S1 & S2 heard, RRR. No JVD, murmurs, rubs, gallops or clicks. No pedal edema. Gastrointestinal system: Abdomen is nondistended, soft and nontender. No organomegaly or masses felt. Normal bowel sounds heard. Central nervous system: Alert and oriented. No focal neurological deficits. Extremities: Symmetric 5 x 5 power. Skin: No rashes, lesions or ulcers Psychiatry: Judgement and insight appear normal. Mood & affect appropriate.     The results of significant diagnostics from this hospitalization  (including imaging, microbiology, ancillary and laboratory) are listed below for reference.      Labs: CBC: Recent Labs  Lab 01/10/19 1640 01/13/19 0429  WBC 9.5 7.9  NEUTROABS 6.5  --   HGB 16.1* 12.0  HCT 46.7* 36.9  MCV 84.6 86.4  PLT 282 175   Basic Metabolic Panel: Recent Labs  Lab 01/10/19 1640 01/11/19 0218  NA 137 135  K 3.6 3.6  CL 103 106  CO2 18* 20*  GLUCOSE 109* 103*  BUN 23* 21*  CREATININE 1.07* 0.86  CALCIUM 10.6* 9.3   Liver Function Tests: Recent Labs  Lab 01/10/19 1640  AST 23  ALT 35  ALKPHOS 98  BILITOT 1.5*  PROT 7.4  ALBUMIN 4.4   Thyroid function studies Recent Labs    01/10/19 2013  TSH 0.784   Urinalysis    Component Value Date/Time   COLORURINE YELLOW 12/28/2018 1417   APPEARANCEUR HAZY (A) 12/28/2018 1417   LABSPEC 1.031 (H) 12/28/2018 1417   PHURINE 9.0 (H) 12/28/2018 1417   GLUCOSEU NEGATIVE 12/28/2018 1417   HGBUR NEGATIVE 12/28/2018 1417   [place] 12/28/2018 1417  KETONESUR 5 (A) 12/28/2018 1417   PROTEINUR NEGATIVE 12/28/2018 1417   UROBILINOGEN 0.2 07/13/2015 2351   NITRITE NEGATIVE 12/28/2018 1417   LEUKOCYTESUR NEGATIVE 12/28/2018 1417   Discussed in detail with patient's spouse at bedside.  Updated care and answered questions.   Time coordinating discharge: 35 minutes  SIGNED:  Vernell Leep, MD, FACP, [facility]. [facility]  To contact the attending provider between 7A-7P or the covering provider during after hours 7P-7A, please log into the web site [email or web address] and access using universal Lee password for that web site. If you do not have the password, please call the hospital operator.

## 2019-01-16 ENCOUNTER — Other Ambulatory Visit: Payer: Self-pay

## 2019-01-16 ENCOUNTER — Telehealth: Payer: Self-pay | Admitting: Gastroenterology

## 2019-01-16 DIAGNOSIS — A048 Other specified bacterial intestinal infections: Secondary | ICD-10-CM

## 2019-01-16 MED ORDER — AMOXICILLIN 500 MG PO CAPS: 1000.0000 mg | ORAL_CAPSULE | Freq: Two times a day (BID) | ORAL | 0 refills | Status: AC

## 2019-01-16 MED ORDER — CLARITHROMYCIN 500 MG PO TABS: 500.0000 mg | ORAL_TABLET | Freq: Two times a day (BID) | ORAL | 0 refills | Status: DC

## 2019-01-16 MED ORDER — METRONIDAZOLE 500 MG PO TABS: 500.0000 mg | ORAL_TABLET | Freq: Two times a day (BID) | ORAL | 0 refills | Status: DC

## 2019-01-16 NOTE — Telephone Encounter (Signed)
Reviewed the results with the patient again and her mother

## 2019-01-16 NOTE — Telephone Encounter (Signed)
Pls call pt, she has some questions regarding her results.

## 2019-01-17 ENCOUNTER — Encounter (HOSPITAL_COMMUNITY): Payer: Self-pay | Admitting: Physician Assistant

## 2019-01-30 ENCOUNTER — Telehealth: Payer: Self-pay | Admitting: Gastroenterology

## 2019-01-30 NOTE — Telephone Encounter (Signed)
Spoke with pt regarding virtual visit, she stated that she does not have a smartphone or devise for virtual visit. However, her son does. Pt will have her son to call to arrange virtual visit with him. Pls offer either webex or zoom with Dr. Havery Moros.

## 2019-01-30 NOTE — Telephone Encounter (Signed)
Per the discharge summary see below  Recommendations for Outpatient Follow-up:  1. Dr. Grier Mitts, PCP in 1 week with repeat labs (CBC & BMP). 2. Dr. Upham Cellar, [facility] in 12 weeks.

## 2019-01-30 NOTE — Telephone Encounter (Signed)
Please offer pt a zoom or telehealth appt to discuss.  Thanks

## 2019-01-30 NOTE — Telephone Encounter (Signed)
Pt called looking to schedule a f/u appt with Dr. Lyndel Safe, she stated that her d/c papers from hospital indicate that she needs a f/u. She also needs rf on some medications and is looking for Dr. Lyndel Safe to sign on his disability papers, not sure if this is possible since pt has never been seen. Pls call her.

## 2019-02-04 ENCOUNTER — Ambulatory Visit: Payer: Self-pay | Admitting: Family Medicine

## 2019-02-04 ENCOUNTER — Encounter: Payer: Self-pay | Admitting: Primary Care

## 2019-02-04 ENCOUNTER — Ambulatory Visit: Payer: Self-pay | Attending: Family Medicine | Admitting: Primary Care

## 2019-02-04 ENCOUNTER — Other Ambulatory Visit: Payer: Self-pay

## 2019-02-04 DIAGNOSIS — K2100 Gastro-esophageal reflux disease with esophagitis, without bleeding: Secondary | ICD-10-CM

## 2019-02-04 DIAGNOSIS — B9681 Helicobacter pylori [H. pylori] as the cause of diseases classified elsewhere: Secondary | ICD-10-CM

## 2019-02-04 DIAGNOSIS — K279 Peptic ulcer, site unspecified, unspecified as acute or chronic, without hemorrhage or perforation: Secondary | ICD-10-CM

## 2019-02-04 DIAGNOSIS — Z8709 Personal history of other diseases of the respiratory system: Secondary | ICD-10-CM

## 2019-02-04 MED ORDER — PANTOPRAZOLE SODIUM 40 MG PO TBEC: 40.0000 mg | DELAYED_RELEASE_TABLET | Freq: Two times a day (BID) | ORAL | 0 refills | Status: DC

## 2019-02-04 MED ORDER — ONDANSETRON HCL 4 MG PO TABS: 4.0000 mg | ORAL_TABLET | Freq: Three times a day (TID) | ORAL | 0 refills | Status: DC | PRN

## 2019-02-04 NOTE — Progress Notes (Signed)
[facility].  Per patient she is taking Biaxin 2 tabs BID

## 2019-02-04 NOTE — Progress Notes (Signed)
New Patient Office Visit  Subjective:  Patient ID: [name], female    DOB: 02/23/1961  Age: 58 y.o. MRN: [id number]  CC: No chief complaint on file.   HPI [name] presents for after hospital follow up. She has a past medical history significant of GERD, diverticulosis, hyperlipidemia, and  difficulty swallowing. Patient states that she has been having difficulty swallowing for more than 2 month.  She underwent endoscopy and esophageal was stretch. This has made some improvement but not completely.he has difficulty swallowing to both liquid and solid food. N/V conts but controlled withZoloft.   Past Medical History:  Diagnosis Date  . Allergy    uses Albuterol inhaler during allergy season  . Diverticulosis 2013   Colonoscopy  . GERD (gastroesophageal reflux disease)   . Hiatal hernia    CT abdomen in August 2013  . Hx of colonic polyps 2013   Colonoscopy   . Osteoarthritis    b/l hip, seen on CT abd in 05/2012    Past Surgical History:  Procedure Laterality Date  . BIOPSY  01/12/2019   Procedure: BIOPSY;  Surgeon: Jackquline Denmark, MD;  Location: [facility] ENDOSCOPY;  Service: Endoscopy;;  . ESOPHAGOGASTRODUODENOSCOPY (EGD) WITH PROPOFOL N/A 01/12/2019   Procedure: ESOPHAGOGASTRODUODENOSCOPY (EGD) WITH PROPOFOL;  Surgeon: Jackquline Denmark, MD;  Location: [facility] ENDOSCOPY;  Service: Endoscopy;  Laterality: N/A;  Venia Minks DILATION  01/12/2019   Procedure: Venia Minks DILATION;  Surgeon: Jackquline Denmark, MD;  Location: [facility] ENDOSCOPY;  Service: Endoscopy;;  . TONSILLECTOMY     childhood  . TOTAL ABDOMINAL HYSTERECTOMY W/ BILATERAL SALPINGOOPHORECTOMY  2008   Fibroids, done by Dr. Clovia Cuff at [facility]    Family History  Problem Relation Age of Onset  . Stroke Father        passed away at 55 yo of cerebal brain hemorrhage   . Hypertension Mother   . Hyperlipidemia Mother   . Colon cancer Neg Hx   . Stomach cancer Neg Hx   . Colon polyps Neg Hx   . Rectal cancer Neg Hx      Social History   Socioeconomic History  . Marital status: Single    Spouse name: Not on file  . Number of children: 3  . Years of education: 12th  . Highest education level: Not on file  Occupational History  . Occupation: ASS. CATHETERS    Employer: [employer]    Comment: layed off  Social Needs  . Financial resource strain: Not on file  . Food insecurity:    Worry: Not on file    Inability: Not on file  . Transportation needs:    Medical: Not on file    Non-medical: Not on file  Tobacco Use  . Smoking status: Former Smoker    Packs/day: 0.50    Years: 31.00    Pack years: 15.50    Types: Cigarettes  . Smokeless tobacco: Never Used  Substance and Sexual Activity  . Alcohol use: Not Currently    Alcohol/week: 2.0 standard drinks    Types: 2 Glasses of wine per week    Comment: 1-2 beers/week  . Drug use: No  . Sexual activity: Yes    Partners: Male    Comment: Same partner for 11 years  Lifestyle  . Physical activity:    Days per week: Not on file    Minutes per session: Not on file  . Stress: Not on file  Relationships  . Social connections:    Talks  on phone: Not on file    Gets together: Not on file    Attends religious service: Not on file    Active member of club or organization: Not on file    Attends meetings of clubs or organizations: Not on file    Relationship status: Not on file  . Intimate partner violence:    Fear of current or ex partner: Not on file    Emotionally abused: Not on file    Physically abused: Not on file    Forced sexual activity: Not on file  Other Topics Concern  . Not on file  Social History Narrative   Live with her mother   Has three kids - all grown - all in [place] area   4 grandchildren   [place] at home - 11 years    ROS Review of Systems  Constitutional: Positive for appetite change and fatigue.  HENT: Positive for dental problem and postnasal drip.        Swallowing difficulty  Eyes: Negative.    Respiratory: Positive for choking.   Cardiovascular: Negative.   Gastrointestinal: Positive for nausea and vomiting.  Endocrine: Negative.   Genitourinary: Negative.   Musculoskeletal:       Shoulder hurts right no decrease ROM  Skin: Negative.   Neurological: Positive for weakness and headaches.  Hematological: Negative.   Psychiatric/Behavioral: Negative.     Objective:   Today's Vitals: BP 110/74 (BP Location: Right Arm, Patient Position: Sitting, Cuff Size: Normal)   Pulse 88   Temp 98.4 F (36.9 C) (Oral)   Ht 5\' 7"  (1.702 m)   Wt 187 lb 6.4 oz (85 kg)   SpO2 96%   BMI 29.35 kg/m   Physical Exam Constitutional:      Appearance: Normal appearance.  HENT:     Head: Normocephalic and atraumatic.     Nose: Congestion and rhinorrhea present.     Mouth/Throat:     Mouth: Mucous membranes are moist.  Eyes:     Extraocular Movements: Extraocular movements intact.  Neck:     Musculoskeletal: Normal range of motion.  Cardiovascular:     Rate and Rhythm: Bradycardia present.     Pulses: Normal pulses.  Pulmonary:     Effort: Pulmonary effort is normal.     Breath sounds: Normal breath sounds.  Abdominal:     General: Bowel sounds are normal.     Palpations: Abdomen is soft.  Musculoskeletal: Normal range of motion.  Skin:    General: Skin is warm.  Neurological:     General: No focal deficit present.     Mental Status: She is alert.     Assessment & Plan:   Problem List Items Addressed This Visit    GERD (gastroesophageal reflux disease)   Relevant Medications   pantoprazole (PROTONIX) 40 MG tablet   ondansetron (ZOFRAN) 4 MG tablet   History of asthma - Primary    Other Visit Diagnoses    H pylori ulcer          Outpatient Encounter Medications as of 02/04/2019  Medication Sig  . albuterol (PROVENTIL HFA;VENTOLIN HFA) 108 (90 BASE) MCG/ACT inhaler Inhale 2 puffs into the lungs every 6 (six) hours as needed. For shortness of breath  . aspirin 81 MG  chewable tablet Chew 81 mg by mouth daily.  . benzonatate (TESSALON) 100 MG capsule Take 1 capsule (100 mg total) by mouth 3 (three) times daily as needed for cough.  . clarithromycin (BIAXIN) 500  MG tablet Take 1 tablet (500 mg total) by mouth 2 (two) times daily.  . metroNIDAZOLE (FLAGYL) 500 MG tablet Take 1 tablet (500 mg total) by mouth 2 (two) times daily.  . Multiple Vitamin (MULTIVITAMIN WITH MINERALS) TABS tablet Take 1 tablet by mouth daily.  . ondansetron (ZOFRAN) 4 MG tablet Take 1 tablet (4 mg total) by mouth every 8 (eight) hours as needed for nausea or vomiting.  . pantoprazole (PROTONIX) 40 MG tablet Take 1 tablet (40 mg total) by mouth 2 (two) times daily for 30 days.  . polyethylene glycol (MIRALAX / GLYCOLAX) packet Take 17 g by mouth daily.  . ranitidine (ZANTAC) 150 MG tablet Take 1 tablet (150 mg total) by mouth at bedtime for 28 days.  Marland Kitchen senna (SENOKOT) 8.6 MG TABS tablet Take 1 tablet (8.6 mg total) by mouth daily as needed for mild constipation or moderate constipation.  . [DISCONTINUED] ondansetron (ZOFRAN) 4 MG tablet Take 1 tablet (4 mg total) by mouth every 8 (eight) hours as needed for nausea or vomiting.  . [DISCONTINUED] pantoprazole (PROTONIX) 40 MG tablet Take 1 tablet (40 mg total) by mouth daily. (Patient taking differently: Take 40 mg by mouth 2 (two) times daily. )   No facility-administered encounter medications on file as of 02/04/2019.   Diagnoses and all orders for this visit:  History of asthma Well controlled on albuterol HFA    H pylori ulcer- tx during hospitalization and d/c on medication  Gastroesophageal reflux disease with esophagitis  Followed by GI and tx with Protonix but 40mg  BID.  Other orders -     pantoprazole (PROTONIX) 40 MG tablet; Take 1 tablet (40 mg total) by mouth 2 (two) times daily for 30 days. -     ondansetron (ZOFRAN) 4 MG tablet; Take 1 tablet (4 mg total) by mouth every 8 (eight) hours as needed for nausea or vomiting.     Follow-up: Return in about 6 months (around 08/06/2019) for GERD, weight loss.   Kerin Perna, NP

## 2019-02-06 NOTE — Progress Notes (Signed)
Prescreened pt for 4-16 appt with Dr. Havery Moros. Pt confirmed Doximity on daughter's phone: [phone]

## 2019-02-07 ENCOUNTER — Other Ambulatory Visit: Payer: Self-pay

## 2019-02-07 ENCOUNTER — Ambulatory Visit (INDEPENDENT_AMBULATORY_CARE_PROVIDER_SITE_OTHER): Payer: Self-pay | Admitting: Gastroenterology

## 2019-02-07 VITALS — Ht 70.0 in | Wt 187.0 lb

## 2019-02-07 DIAGNOSIS — K2970 Gastritis, unspecified, without bleeding: Secondary | ICD-10-CM

## 2019-02-07 DIAGNOSIS — B9681 Helicobacter pylori [H. pylori] as the cause of diseases classified elsewhere: Secondary | ICD-10-CM

## 2019-02-07 DIAGNOSIS — R112 Nausea with vomiting, unspecified: Secondary | ICD-10-CM

## 2019-02-07 DIAGNOSIS — K2100 Gastro-esophageal reflux disease with esophagitis, without bleeding: Secondary | ICD-10-CM

## 2019-02-07 DIAGNOSIS — R1310 Dysphagia, unspecified: Secondary | ICD-10-CM

## 2019-02-07 DIAGNOSIS — K5900 Constipation, unspecified: Secondary | ICD-10-CM

## 2019-02-07 DIAGNOSIS — R634 Abnormal weight loss: Secondary | ICD-10-CM

## 2019-02-07 MED ORDER — FAMOTIDINE 20 MG PO TABS: ORAL_TABLET | ORAL | 5 refills | Status: DC

## 2019-02-07 MED ORDER — PANTOPRAZOLE SODIUM 40 MG PO TBEC: DELAYED_RELEASE_TABLET | ORAL | Status: DC

## 2019-02-07 MED ORDER — POLYETHYLENE GLYCOL 3350 17 G PO PACK: 17.0000 g | PACK | Freq: Two times a day (BID) | ORAL | 0 refills | Status: AC | PRN

## 2019-02-07 NOTE — Progress Notes (Signed)
THIS ENCOUNTER IS A VIRTUAL VISIT DUE TO COVID-19 - PATIENT WAS NOT SEEN IN THE OFFICE. PATIENT HAS CONSENTED TO VIRTUAL VISIT / TELEMEDICINE VISIT. ATTEMPTED TO USE DOXIMITY APP BUT PATIENT COULD NOT GET ACCESS TO VISUAL ACCESS, SO ONLY AUDIO WAS USED   Location of patient: home Location of provider: office Persons participating: myself, patient  HPI :  58 y/o female here for a follow up visit. I met her when she was hospitalized on 3/20 for 4 weeks worth of nausea / vomiting, poor PO intake associated with 30 lb weight loss. No pain. CT scan 3/6 had showed some gallstones but otherwise no pathology. Normal pancreas. She had an EGD by Dr. Lyndel Safe on 01/12/19 showing a normal esophagus which was dilated to 50Fr Maloney and biopsies taken to rule out EoE. Mild gastric erythema and duodenitis, biopsies taken. She was instructed to take Protonix 50m once daily and Zantac 1529m and resume Reglan PRN. Biopsies were positive for H pylori, she was given amoxicillin 1gm BID, clarithromycin 50060mID, flagyl 500m54mD, and protonix 40mg80m - she has 2 days left of the antibiotics. Instructions were given to complete the course, wait a few weeks and hold PPI during that time, and come back for a stool antigen test, which has not been done.  She reports so far she is doing okay, but will run out of protonix in 2 days. She is having some nausea a bit and using Zofran PRN which helps. Heartburn better controlled. She is not having any vomiting. No significant abdominal pains. She has been staying on soft diet. Still having some dysphagia, only mild improvement with dilation. She weighs 188 lbs, she is starting to regain weight. She has been using senna and prune juice / miralax PRN. She is having a BM every 1-3 days. She is not having any blood in the stools. Overall better but not back to her prior baseline.   She works at a company that [employer] does not feel ready to go back just yet, says she  feels she is at "70%" of prior baseline. Remains fatigued  EGD 01/12/19 - normal esophagus, empiric dilation to 50 Fr Maloney, biopsies taken to rule out EoE, small hiatal hernia noted, mild gastric erythema and mild duodenitis in bulb - biopsies taken.   06/2012 EGD.  For hiccups and reflux symptoms.  Normal study.  5 cm hiatal hernia. The symptoms subsided with Protonix. 9/13 colonoscopy.  Descending and sigmoid diverticulosis, mild.  2, diminutive, sessile polyps in the sigmoid.  Otherwise normal study.  Pathology from the polyps was that of polypoid colorectal mucosa.  10-year follow-up colonoscopy suggested.  12/28/2018 CT abdomen pelvis with contrast which showed uncomplicated tiny gallstones, normal stomach, liver, pancreas, small bowel.  Colonic diverticulosis noted.. 01/10/2019 esophagram shows decreased esophageal motility but no stricture or mass.  There is mild mucosal irregularity.  No masses.  Small hiatal hernia.  Barium tablet passed into the stomach with no delay.  Past Medical History:  Diagnosis Date  . Allergy    uses Albuterol inhaler during allergy season  . Diverticulosis 2013   Colonoscopy  . GERD (gastroesophageal reflux disease)   . Hiatal hernia    CT abdomen in August 2013  . Hx of colonic polyps 2013   Colonoscopy   . Osteoarthritis    b/l hip, seen on CT abd in 05/2012     Past Surgical History:  Procedure Laterality Date  . BIOPSY  01/12/2019  Procedure: BIOPSY;  Surgeon: Jackquline Denmark, MD;  Location: [facility] ENDOSCOPY;  Service: Endoscopy;;  . ESOPHAGOGASTRODUODENOSCOPY (EGD) WITH PROPOFOL N/A 01/12/2019   Procedure: ESOPHAGOGASTRODUODENOSCOPY (EGD) WITH PROPOFOL;  Surgeon: Jackquline Denmark, MD;  Location: [facility] ENDOSCOPY;  Service: Endoscopy;  Laterality: N/A;  Venia Minks DILATION  01/12/2019   Procedure: Venia Minks DILATION;  Surgeon: Jackquline Denmark, MD;  Location: [facility] ENDOSCOPY;  Service: Endoscopy;;  . TONSILLECTOMY     childhood  . TOTAL ABDOMINAL HYSTERECTOMY W/  BILATERAL SALPINGOOPHORECTOMY  2008   Fibroids, done by Dr. Clovia Cuff at [facility]   Family History  Problem Relation Age of Onset  . Stroke Father        passed away at 74 yo of cerebal brain hemorrhage   . Hypertension Mother   . Hyperlipidemia Mother   . Colon cancer Neg Hx   . Stomach cancer Neg Hx   . Colon polyps Neg Hx   . Rectal cancer Neg Hx    Social History   Tobacco Use  . Smoking status: Former Smoker    Packs/day: 0.50    Years: 31.00    Pack years: 15.50    Types: Cigarettes  . Smokeless tobacco: Never Used  Substance Use Topics  . Alcohol use: Not Currently    Alcohol/week: 2.0 standard drinks    Types: 2 Glasses of wine per week    Comment: 1-2 beers/week  . Drug use: No   Current Outpatient Medications  Medication Sig Dispense Refill  . albuterol (PROVENTIL HFA;VENTOLIN HFA) 108 (90 BASE) MCG/ACT inhaler Inhale 2 puffs into the lungs every 6 (six) hours as needed. For shortness of breath    . amoxicillin (AMOXIL) 500 MG capsule Take 1,000 mg by mouth 2 (two) times daily. For 14 days    . aspirin 81 MG chewable tablet Chew 81 mg by mouth. Pt taking "some" days    . benzonatate (TESSALON) 100 MG capsule Take 1 capsule (100 mg total) by mouth 3 (three) times daily as needed for cough.    . clarithromycin (BIAXIN) 500 MG tablet Take 1 tablet (500 mg total) by mouth 2 (two) times daily. 28 tablet 0  . metroNIDAZOLE (FLAGYL) 500 MG tablet Take 1 tablet (500 mg total) by mouth 2 (two) times daily. 28 tablet 0  . Multiple Vitamin (MULTIVITAMIN WITH MINERALS) TABS tablet Take 1 tablet by mouth daily.    . ondansetron (ZOFRAN) 4 MG tablet Take 1 tablet (4 mg total) by mouth every 8 (eight) hours as needed for nausea or vomiting. 15 tablet 0  . pantoprazole (PROTONIX) 40 MG tablet Take 1 tablet (40 mg total) by mouth 2 (two) times daily for 30 days. 60 tablet 0  . polyethylene glycol (MIRALAX / GLYCOLAX) packet Take 17 g by mouth daily. 14 each 0  . ranitidine  (ZANTAC) 150 MG tablet Take 1 tablet (150 mg total) by mouth at bedtime for 28 days. 28 tablet 0  . senna (SENOKOT) 8.6 MG TABS tablet Take 1 tablet (8.6 mg total) by mouth daily as needed for mild constipation or moderate constipation. 30 each 0   No current facility-administered medications for this visit.    No Known Allergies   Review of Systems: All systems reviewed and negative except where noted in HPI.    Dg Esophagus W Single Cm (sol Or Thin Ba)  Result Date: 01/10/2019 CLINICAL DATA:  Dysphagia, weight loss.  Vomiting after eating. EXAM: ESOPHOGRAM/BARIUM SWALLOW TECHNIQUE: Single contrast examination was performed using  thin barium.  FLUOROSCOPY TIME:  Fluoroscopy Time:  1 minutes 0 seconds Radiation Exposure Index (if provided by the fluoroscopic device): Number of Acquired Spot Images: 0 COMPARISON:  None. FINDINGS: Decreased esophageal motility. Negative for stricture or mass. Mild mucosal irregularity may be due to small sips versus esophagitis. No ulceration. Small hiatal hernia Barium tablet passed into the stomach without delay. IMPRESSION: Negative for stricture or mass Decreased esophageal motility.  Possible esophagitis. Electronically Signed   By: Franchot Gallo M.D.   On: 01/10/2019 18:11   Lab Results  Component Value Date   WBC 7.9 01/13/2019   HGB 12.0 01/13/2019   HCT 36.9 01/13/2019   MCV 86.4 01/13/2019   PLT 203 01/13/2019    Lab Results  Component Value Date   CREATININE 0.86 01/11/2019   BUN 21 (H) 01/11/2019   NA 135 01/11/2019   K 3.6 01/11/2019   CL 106 01/11/2019   CO2 20 (L) 01/11/2019    Lab Results  Component Value Date   ALT 35 01/10/2019   AST 23 01/10/2019   ALKPHOS 98 01/10/2019   BILITOT 1.5 (H) 01/10/2019    Physical Exam: NA  ASSESSMENT AND PLAN:  58 y/o female here for a follow up visit to address the following:  H pylori gastritis / nausea with vomiting / weight loss / dysphagia / constipation - CT imaging negative,  labs reassuring. EGD done and remarkable for H pylori gastritis. She has been treated as above and has had improvement in symptoms, eating much better, regaining some weight. She is due to finish her antibiotics in 2 days. About a month after finishing this, I would like her to submit an H pylori antigen test. In order to make this accurate, she will need to hold the protonix for 2 weeks prior to submitting that. She can use H2 blocker in the interim, recommend she stop Zantac given the FDA recall and use Pepcid 60m once to twice daily. She can also continue Zofran PRN. We discussed her dysphagia which is mild. Empiric dilation did not help much. 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 For her constipation, recommend Miralax daily to BID, she is taking it only PRN right now. She otherwise asks about disability paperwork, we will contact her and let her know where to submit this so we can fill out our portion. She is cleared to go back to work whenever she is ready to. She agreed. Due for screening colonoscopy in 2023  SCarolina Cellar MD [facility]

## 2019-02-07 NOTE — Patient Instructions (Signed)
If you are age 57 or older, your body mass index should be between 23-30. Your Body mass index is 26.83 kg/m. If this is out of the aforementioned range listed, please consider follow up with your Primary Care Provider.  If you are age 28 or younger, your body mass index should be between 19-25. Your Body mass index is 26.83 kg/m. If this is out of the aformentioned range listed, please consider follow up with your Primary Care Provider.   We have sent the following medications to your pharmacy for you to pick up at your convenience: Protonix 40mg : Take once to twice daily Pepcid 20mg : Take once to twice daily  Please go to the lab in the basement of our building to get the collection materials needed to do a H. Pylori Stool test.  Hit "B" for basement when you get on the elevator.  When the doors open, the lab is on your left.    Take Protonix for 2 weeks. Then stop Protonix for 2 weeks. Then submit the stool sample for the H. Pylori stool test.    Discontinue Zantac.  Please purchase the following medications over the counter and take as directed: Take Miralax as directed once to twice daily.  Contact [place] at [phone] for help with disability paperwork  Thank you for entrusting me with your care and for choosing [facility], Dr. Sidney Cellar

## 2019-02-21 ENCOUNTER — Telehealth: Payer: Self-pay | Admitting: Physician Assistant

## 2019-02-21 NOTE — Telephone Encounter (Signed)
Patient called requesting the status of her short term disability paperwork, please follow up.

## 2019-02-27 NOTE — Telephone Encounter (Signed)
MA spoke with patient and paperwork needed to be completed by GI. Patient took the form there.

## 2019-03-22 ENCOUNTER — Telehealth: Payer: Self-pay | Admitting: Gastroenterology

## 2019-03-22 NOTE — Telephone Encounter (Signed)
Patient called would like to know if the short term disability has been signed by Dr. Havery Moros already. States it been an on going thing since March

## 2019-03-22 NOTE — Telephone Encounter (Signed)
Called pt and let him know that we do have the paper work and Dr. Havery Moros is working on it.

## 2019-04-08 ENCOUNTER — Encounter: Payer: Self-pay | Admitting: Family Medicine

## 2019-04-08 ENCOUNTER — Other Ambulatory Visit: Payer: Self-pay

## 2019-04-08 ENCOUNTER — Ambulatory Visit: Payer: Self-pay | Attending: Family Medicine | Admitting: Family Medicine

## 2019-04-08 DIAGNOSIS — G478 Other sleep disorders: Secondary | ICD-10-CM

## 2019-04-08 DIAGNOSIS — K2100 Gastro-esophageal reflux disease with esophagitis, without bleeding: Secondary | ICD-10-CM

## 2019-04-08 DIAGNOSIS — G479 Sleep disorder, unspecified: Secondary | ICD-10-CM

## 2019-04-08 MED ORDER — TRAZODONE HCL 50 MG PO TABS: 25.0000 mg | ORAL_TABLET | Freq: Every evening | ORAL | 3 refills | Status: DC | PRN

## 2019-04-08 NOTE — Progress Notes (Signed)
Virtual Visit via Telephone Note  I connected with [name] on 04/08/19 at  2:50 PM EDT by telephone and verified that I am speaking with the correct person using two identifiers.   I discussed the limitations, risks, security and privacy concerns of performing an evaluation and management service by telephone and the availability of in person appointments. I also discussed with the patient that there may be a patient responsible charge related to this service. The patient expressed understanding and agreed to proceed.  Patient Location: Home Provider Location: Office Others participating in call: Emilio Aspen, RMA   History of Present Illness:      58 year old female who reports that she is currently being followed by gastroenterology for acid reflux symptoms.  She states that at one point she was having severe acid reflux with difficulty swallowing and weight loss but states that she currently is regaining the lost weight and has had no difficulty with swallowing.  She has had EGD done by gastroenterology.  She has had a recent increase in reflux symptoms as she states that she was told to stop her Protonix and Pepcid AC by gastroenterology for 2 weeks in order to give a stool sample.  She states that it is almost been 2 weeks so she can soon obtain a stool sample and restart her medication for acid reflux treatment.  She denies any current abdominal pain- no nausea/vomiting/diarrhea or constipation.  No blood in the stool or dark stools.  No urinary frequency, urgency or dysuria.  No fever or chills.  No headaches or dizziness.       She has noticed difficulty with sleep.  She states that for 30 years she worked at the same job and would always go to bed around 9 or 10 PM and wake up between 5-6 AM.  She states that now she falls asleep around 11 PM but still wakes up between 5-6 AM and she feels that she is not getting enough sleep.  She reports that her mother tells her that she seems  antsy/fidgety throughout the day.  She reports that she is used to being at work during the day and because of COVID-19 she is mostly at home other than going out to the pharmacy or grocery store.  She does not feel as if she is having any significant snoring as she has had her tonsils and adenoids removed in the past.  Her mother feels that patient sometimes has mild snoring.  Patient has been taking over-the-counter Tylenol PM which helps her fall asleep but then she still awakens early.  She is not having any pain at bedtime but is taking the Tylenol PM just to help with sleep.  Past Medical History:  Diagnosis Date  . Allergy    uses Albuterol inhaler during allergy season  . Diverticulosis 2013   Colonoscopy  . GERD (gastroesophageal reflux disease)   . Hiatal hernia    CT abdomen in August 2013  . Hx of colonic polyps 2013   Colonoscopy   . Osteoarthritis    b/l hip, seen on CT abd in 05/2012    Past Surgical History:  Procedure Laterality Date  . BIOPSY  01/12/2019   Procedure: BIOPSY;  Surgeon: Jackquline Denmark, MD;  Location: [facility] ENDOSCOPY;  Service: Endoscopy;;  . ESOPHAGOGASTRODUODENOSCOPY (EGD) WITH PROPOFOL N/A 01/12/2019   Procedure: ESOPHAGOGASTRODUODENOSCOPY (EGD) WITH PROPOFOL;  Surgeon: Jackquline Denmark, MD;  Location: [facility] ENDOSCOPY;  Service: Endoscopy;  Laterality: N/A;  . MALONEY DILATION  01/12/2019   Procedure: Venia Minks DILATION;  Surgeon: Jackquline Denmark, MD;  Location[facility] ENDOSCOPY;  Service: Endoscopy;;  . TONSILLECTOMY     childhood  . TOTAL ABDOMINAL HYSTERECTOMY W/ BILATERAL SALPINGOOPHORECTOMY  2008   Fibroids, done by Dr. Clovia Cuff at [facility]    Family History  Problem Relation Age of Onset  . Stroke Father        passed away at 51 yo of cerebal brain hemorrhage   . Hypertension Mother   . Hyperlipidemia Mother   . Colon cancer Neg Hx   . Stomach cancer Neg Hx   . Colon polyps Neg Hx   . Rectal cancer Neg Hx     Social History   Tobacco Use  .  Smoking status: Former Smoker    Packs/day: 0.50    Years: 31.00    Pack years: 15.50    Types: Cigarettes  . Smokeless tobacco: Never Used  Substance Use Topics  . Alcohol use: Not Currently    Alcohol/week: 2.0 standard drinks    Types: 2 Glasses of wine per week    Comment: 1-2 beers/week  . Drug use: No     No Known Allergies     Observations/Objective: No vital signs or physical exam conducted as visit was done via telephone  Assessment and Plan: 1. Gastroesophageal reflux disease with esophagitis Patient has had treatment for H. pylori and is status post EGD.  She reports that she has had to temporarily stop her pantoprazole and Pepcid AC for 2 weeks in order to obtain a stool card for gastroenterology.  She does feel that the pantoprazole and Pepcid AC greatly improved her reflux symptoms and she can restart the medication soon.  She is to keep follow-up with GI and continue to avoid trigger foods and late night eating  2. Difficulty sleeping Patient was encouraged to discontinue the use of Tylenol PM.  She was asked to try at least 2 weeks of melatonin at either 3 mg or 5 mg to see if this can help with her sleep difficulty and to help reset her sleep cycle.  Patient may start the use of trazodone 50 mg taking one half or 1 tablet at bedtime as needed for sleep if her sleep is not improved with use of melatonin over the next 2 weeks.  If she continues to have issues with poor sleep and daytime fatigue, I also suggested sleep study for further evaluation.  She will call or return to clinic in 6 weeks regarding her difficulty sleeping. - traZODone (DESYREL) 50 MG tablet; Take 0.5-1 tablets (25-50 mg total) by mouth at bedtime as needed for sleep.  Dispense: 30 tablet; Refill: 3  Follow Up Instructions:Return in about 6 weeks (around 05/20/2019) for Sleep difficulty/GERD.    I discussed the assessment and treatment plan with the patient. The patient was provided an opportunity to  ask questions and all were answered. The patient agreed with the plan and demonstrated an understanding of the instructions.   The patient was advised to call back or seek an in-person evaluation if the symptoms worsen or if the condition fails to improve as anticipated.  I provided 12 minutes of non-face-to-face time during this encounter.   Antony Blackbird, MD

## 2019-04-08 NOTE — Progress Notes (Signed)
Per pt she is being seen for GERD  Having problems with sleeping Tylenol PM for about 2 wks now

## 2019-05-22 ENCOUNTER — Ambulatory Visit: Payer: Self-pay | Admitting: Family Medicine

## 2019-05-30 ENCOUNTER — Other Ambulatory Visit: Payer: Self-pay | Admitting: Family Medicine

## 2019-05-31 ENCOUNTER — Telehealth: Payer: Self-pay | Admitting: *Deleted

## 2019-05-31 NOTE — Telephone Encounter (Signed)
Paperwork from [place] was received and given to Dr. Chapman Fitch to complete. Provider gived forms to staff with a note stating that the forms need to either be completed by patient GI doctor or by Juluis Mire, NP and to call patient and informed her with this information and may need to return to Juluis Mire, NP for follow up for return to work. Called patient to inform her with this information and patient stated for staff to not worry about this form due to this has already been token care of.

## 2019-06-03 ENCOUNTER — Other Ambulatory Visit: Payer: Self-pay | Admitting: Family Medicine

## 2019-06-03 NOTE — Telephone Encounter (Signed)
1) Medication(s) Requested (by name): Benzonatate  2) Pharmacy of Choice: [facility] [place]  3) Special Requests:   Approved medications will be sent to the pharmacy, we will reach out if there is an issue.  Requests made after 3pm may not be addressed until the following business day!  If a patient is unsure of the name of the medication(s) please note and ask patient to call back when they are able to provide all info, do not send to responsible party until all information is available!

## 2019-06-07 ENCOUNTER — Ambulatory Visit: Payer: Self-pay | Admitting: Family Medicine

## 2019-06-21 ENCOUNTER — Ambulatory Visit (HOSPITAL_BASED_OUTPATIENT_CLINIC_OR_DEPARTMENT_OTHER): Payer: Self-pay | Admitting: Pharmacist

## 2019-06-21 ENCOUNTER — Encounter: Payer: Self-pay | Admitting: Family Medicine

## 2019-06-21 ENCOUNTER — Other Ambulatory Visit: Payer: Self-pay

## 2019-06-21 ENCOUNTER — Ambulatory Visit: Payer: Self-pay | Attending: Family Medicine | Admitting: Family Medicine

## 2019-06-21 VITALS — BP 123/82 | HR 88 | Temp 98.6°F | Resp 18 | Ht 70.0 in | Wt 251.0 lb

## 2019-06-21 DIAGNOSIS — K2100 Gastro-esophageal reflux disease with esophagitis, without bleeding: Secondary | ICD-10-CM

## 2019-06-21 DIAGNOSIS — R17 Unspecified jaundice: Secondary | ICD-10-CM

## 2019-06-21 DIAGNOSIS — K219 Gastro-esophageal reflux disease without esophagitis: Secondary | ICD-10-CM

## 2019-06-21 DIAGNOSIS — I83893 Varicose veins of bilateral lower extremities with other complications: Secondary | ICD-10-CM

## 2019-06-21 DIAGNOSIS — Z1159 Encounter for screening for other viral diseases: Secondary | ICD-10-CM

## 2019-06-21 DIAGNOSIS — J309 Allergic rhinitis, unspecified: Secondary | ICD-10-CM

## 2019-06-21 DIAGNOSIS — I872 Venous insufficiency (chronic) (peripheral): Secondary | ICD-10-CM

## 2019-06-21 DIAGNOSIS — R05 Cough: Secondary | ICD-10-CM

## 2019-06-21 DIAGNOSIS — Z23 Encounter for immunization: Secondary | ICD-10-CM

## 2019-06-21 DIAGNOSIS — R058 Other specified cough: Secondary | ICD-10-CM

## 2019-06-21 DIAGNOSIS — R030 Elevated blood-pressure reading, without diagnosis of hypertension: Secondary | ICD-10-CM

## 2019-06-21 DIAGNOSIS — R600 Localized edema: Secondary | ICD-10-CM

## 2019-06-21 DIAGNOSIS — M25552 Pain in left hip: Secondary | ICD-10-CM

## 2019-06-21 DIAGNOSIS — Z1231 Encounter for screening mammogram for malignant neoplasm of breast: Secondary | ICD-10-CM

## 2019-06-21 DIAGNOSIS — H6501 Acute serous otitis media, right ear: Secondary | ICD-10-CM

## 2019-06-21 MED ORDER — AMOXICILLIN-POT CLAVULANATE 500-125 MG PO TABS: 1.0000 | ORAL_TABLET | Freq: Two times a day (BID) | ORAL | 0 refills | Status: AC

## 2019-06-21 MED ORDER — BENZONATATE 100 MG PO CAPS: 100.0000 mg | ORAL_CAPSULE | Freq: Two times a day (BID) | ORAL | 0 refills | Status: DC | PRN

## 2019-06-21 MED ORDER — FAMOTIDINE 20 MG PO TABS: ORAL_TABLET | ORAL | 5 refills | Status: DC

## 2019-06-21 MED ORDER — PANTOPRAZOLE SODIUM 40 MG PO TBEC: DELAYED_RELEASE_TABLET | ORAL | Status: DC

## 2019-06-21 MED ORDER — HYDROCHLOROTHIAZIDE 25 MG PO TABS: 25.0000 mg | ORAL_TABLET | Freq: Every day | ORAL | 3 refills | Status: DC

## 2019-06-21 MED ORDER — LORATADINE 10 MG PO TABS: 10.0000 mg | ORAL_TABLET | Freq: Every day | ORAL | 11 refills | Status: DC

## 2019-06-21 NOTE — Progress Notes (Signed)
Patient presents for vaccination against influenza per orders of Dr. Fulp. Consent given. Counseling provided. No contraindications exists. Vaccine administered without incident.   

## 2019-06-21 NOTE — Progress Notes (Signed)
Patient presented with worry and BP was 146/82. After calming down patients BP reported 123/82

## 2019-06-21 NOTE — Progress Notes (Signed)
Established Patient Office Visit  Subjective:  Patient ID: [name], female    DOB: 1960/11/22  Age: 58 y.o. MRN: [id number]  CC:  Chief Complaint  Patient presents with  . Follow-up    HPI [name] presents for follow-up of telemedicine visit on 04/08/2019 at which time she was having issues with severe acid reflux which had caused her to have difficulty swallowing and weight loss but was status post EGD by gastroenterology and on medications which had improved her symptoms of acid reflux and swallowing issues had resolved.  At today's visit, she reports that her GERD symptoms are currently controlled and she has no sore throat or difficulty swallowing.  She has noticed issues with her chronic lower extremity edema as well as some left hip pain.  She has also had some onset of right ear pain for the past week.  She has a dull, aching sensation within the ear that is slightly radiates into her neck area.  She denies any difficulty/decreased hearing.  She has had no discharge from the ear and she denies any pain at the outside of the ear.  She has had no fever or chills.  She has had some increased nasal congestion due to allergic rhinitis.  She also has complaint of recurrent dry cough for which she has taken Tessalon in the past and she would like to have a refill of this prescription.  Patient does not currently smoke but she did smoke for many years in the past.  She denies any issues with shortness of breath.  Cough has remained nonproductive.         Blood pressure is elevated at today's visit but she denies any headaches or dizziness related to her blood pressure.  She does report issues with swelling in her legs and wonders if a fluid pill would be helpful.  Patient also continues to have issues with left hip pain, dull, aching sensation but sometimes sharp with movement.  Pain is worse with walking and ranges from a 6-8 on a 0-to-10 scale.       Past Medical History:  Diagnosis  Date  . Allergy    uses Albuterol inhaler during allergy season  . Diverticulosis 2013   Colonoscopy  . GERD (gastroesophageal reflux disease)   . Hiatal hernia    CT abdomen in August 2013  . Hx of colonic polyps 2013   Colonoscopy   . Osteoarthritis    b/l hip, seen on CT abd in 05/2012    Past Surgical History:  Procedure Laterality Date  . BIOPSY  01/12/2019   Procedure: BIOPSY;  Surgeon: Jackquline Denmark, MD;  Location: [facility] ENDOSCOPY;  Service: Endoscopy;;  . ESOPHAGOGASTRODUODENOSCOPY (EGD) WITH PROPOFOL N/A 01/12/2019   Procedure: ESOPHAGOGASTRODUODENOSCOPY (EGD) WITH PROPOFOL;  Surgeon: Jackquline Denmark, MD;  Location: [facility] ENDOSCOPY;  Service: Endoscopy;  Laterality: N/A;  Venia Minks DILATION  01/12/2019   Procedure: Venia Minks DILATION;  Surgeon: Jackquline Denmark, MD;  Location: [facility] ENDOSCOPY;  Service: Endoscopy;;  . TONSILLECTOMY     childhood  . TOTAL ABDOMINAL HYSTERECTOMY W/ BILATERAL SALPINGOOPHORECTOMY  2008   Fibroids, done by Dr. Clovia Cuff at [facility]    Family History  Problem Relation Age of Onset  . Stroke Father        passed away at 102 yo of cerebal brain hemorrhage   . Hypertension Mother   . Hyperlipidemia Mother   . Colon cancer Neg Hx   . Stomach cancer Neg Hx   .  Colon polyps Neg Hx   . Rectal cancer Neg Hx     Social History   Socioeconomic History  . Marital status: Single    Spouse name: Not on file  . Number of children: 3  . Years of education: 12th  . Highest education level: Not on file  Occupational History  . Occupation: ASS. CATHETERS    Employer: [employer]    Comment: layed off  Social Needs  . Financial resource strain: Not on file  . Food insecurity    Worry: Not on file    Inability: Not on file  . Transportation needs    Medical: Not on file    Non-medical: Not on file  Tobacco Use  . Smoking status: Former Smoker    Packs/day: 0.50    Years: 31.00    Pack years: 15.50    Types: Cigarettes  . Smokeless tobacco: Never Used   Substance and Sexual Activity  . Alcohol use: Not Currently    Alcohol/week: 2.0 standard drinks    Types: 2 Glasses of wine per week    Comment: 1-2 beers/week  . Drug use: No  . Sexual activity: Yes    Partners: Male    Comment: Same partner for 11 years  Lifestyle  . Physical activity    Days per week: Not on file    Minutes per session: Not on file  . Stress: Not on file  Relationships  . Social Herbalist on phone: Not on file    Gets together: Not on file    Attends religious service: Not on file    Active member of club or organization: Not on file    Attends meetings of clubs or organizations: Not on file    Relationship status: Not on file  . Intimate partner violence    Fear of current or ex partner: Not on file    Emotionally abused: Not on file    Physically abused: Not on file    Forced sexual activity: Not on file  Other Topics Concern  . Not on file  Social History Narrative   Live with her mother   Has three kids - all grown - all in [place] area   4 grandchildren   [place] at home - 11 years    Outpatient Medications Prior to Visit  Medication Sig Dispense Refill  . albuterol (PROVENTIL HFA;VENTOLIN HFA) 108 (90 BASE) MCG/ACT inhaler Inhale 2 puffs into the lungs every 6 (six) hours as needed. For shortness of breath    . aspirin 81 MG chewable tablet Chew 81 mg by mouth. Pt taking "some" days    . famotidine (PEPCID) 20 MG tablet Take 1 tablet by mouth once to twice daily 60 tablet 5  . Multiple Vitamin (MULTIVITAMIN WITH MINERALS) TABS tablet Take 1 tablet by mouth daily.    . pantoprazole (PROTONIX) 40 MG tablet Take one tablet by mouth once to twice daily 60 tablet 03  . polyethylene glycol (MIRALAX / GLYCOLAX) 17 g packet Take 17 g by mouth 2 (two) times daily as needed.  0  . traZODone (DESYREL) 50 MG tablet Take 0.5-1 tablets (25-50 mg total) by mouth at bedtime as needed for sleep. 30 tablet 3  . benzonatate (TESSALON) 100 MG  capsule Take 1 capsule (100 mg total) by mouth 3 (three) times daily as needed for cough.    . ondansetron (ZOFRAN) 4 MG tablet Take 1 tablet (4 mg total) by mouth every  8 (eight) hours as needed for nausea or vomiting. 15 tablet 0   No facility-administered medications prior to visit.     No Known Allergies  ROS Review of Systems  Constitutional: Positive for fatigue. Negative for chills.  HENT: Positive for congestion, ear pain and postnasal drip. Negative for sinus pressure, sinus pain, sore throat and trouble swallowing.   Eyes: Negative for photophobia.  Respiratory: Positive for cough (recurrent dry cough). Negative for shortness of breath.   Cardiovascular: Positive for leg swelling. Negative for chest pain and palpitations.  Gastrointestinal: Negative for abdominal pain, blood in stool, constipation, diarrhea and nausea.  Endocrine: Negative for cold intolerance, heat intolerance, polydipsia, polyphagia and polyuria.  Genitourinary: Negative for difficulty urinating, dysuria and frequency.  Musculoskeletal: Positive for arthralgias and gait problem.  Allergic/Immunologic: Positive for environmental allergies. Negative for food allergies.  Neurological: Negative for dizziness and headaches.  Hematological: Negative for adenopathy. Does not bruise/bleed easily.  Psychiatric/Behavioral: Negative for self-injury and suicidal ideas.      Objective:    Physical Exam  Constitutional: She is oriented to person, place, and time. She appears well-nourished.  Morbidly obese older female in NAD  HENT:  Right Ear: External ear normal. Tympanic membrane is erythematous.  Left Ear: Hearing and ear canal normal.  Nose: Mucosal edema (moderat) and rhinorrhea (clear) present.  Mouth/Throat: Posterior oropharyngeal edema and posterior oropharyngeal erythema present.  Right TM is light pink without visible landmarks but not significantly thickened; left TM is dull; mild edema/erythema of the  posterior pharynx with cobblestoning; some narrowing of posterior airway due to body habitus and large tongue base  Neck: Normal range of motion. Neck supple. No JVD present. No thyromegaly present.  Cardiovascular: Normal rate and regular rhythm.  Pulmonary/Chest: Effort normal and breath sounds normal.  Abdominal: Soft. There is no abdominal tenderness. There is no rebound and no guarding.  Truncal obesity  Musculoskeletal:        General: Tenderness and edema present.     Comments: No CVA tenderness; mild lumbosacral tenderness to palpation; tender to palp at area of greater trochanter of left hip and with hip rotation. Edema/swelling of the bilateral lower legs and presence of varicose veins  Lymphadenopathy:    She has no cervical adenopathy.  Neurological: She is alert and oriented to person, place, and time.  Skin: Skin is warm and dry.  Psychiatric: She has a normal mood and affect. Her behavior is normal.  Nursing note and vitals reviewed.   BP (!) 147/82 (BP Location: Left Arm, Patient Position: Sitting, Cuff Size: Large)   Pulse 88   Temp 98.6 F (37 C) (Oral)   Resp 18   Ht 5\' 10"  (1.778 m)   Wt 251 lb (113.9 kg)   SpO2 98%   BMI 36.01 kg/m  Wt Readings from Last 3 Encounters:  06/21/19 251 lb (113.9 kg)  02/07/19 187 lb (84.8 kg)  02/06/19 187 lb (84.8 kg)     Health Maintenance Due  Topic Date Due  . Hepatitis C Screening  Mar 19, 1961  . MAMMOGRAM  05/24/2012  . INFLUENZA VACCINE  05/25/2019    Lab Results  Component Value Date   TSH 0.784 01/10/2019   Lab Results  Component Value Date   WBC 7.9 01/13/2019   HGB 12.0 01/13/2019   HCT 36.9 01/13/2019   MCV 86.4 01/13/2019   PLT 203 01/13/2019   Lab Results  Component Value Date   NA 135 01/11/2019   K 3.6 01/11/2019  CO2 20 (L) 01/11/2019   GLUCOSE 103 (H) 01/11/2019   BUN 21 (H) 01/11/2019   CREATININE 0.86 01/11/2019   BILITOT 1.5 (H) 01/10/2019   ALKPHOS 98 01/10/2019   AST 23 01/10/2019    ALT 35 01/10/2019   PROT 7.4 01/10/2019   ALBUMIN 4.4 01/10/2019   CALCIUM 9.3 01/11/2019   ANIONGAP 9 01/11/2019   Lab Results  Component Value Date   CHOL 263 (H) 07/26/2012   Lab Results  Component Value Date   HDL 41 07/26/2012   Lab Results  Component Value Date   LDLCALC 145 (H) 07/26/2012   Lab Results  Component Value Date   TRIG 384 (H) 07/26/2012   Lab Results  Component Value Date   CHOLHDL 6.4 07/26/2012   No results found for: HGBA1C    Assessment & Plan:  -Need for hepatitis C screening test; elevated bilirubin Patient agrees to have screening for hepatitis C as she falls in a higher risk age group.  Patient will return for future lab for hepatitis B antibody.  Will also obtain hepatic function panel at future lab visit as patient has also had increased bilirubin on prior labs total bilirubin of 1.5 on 01/10/2019 comprehensive metabolic panel.  Patient also had finding of tiny gallstones with normal appearance to the liver on CT of the abdomen and pelvis done in March of this year. -Hepatitis B antibody; Future -Hepatic function panel; Future  -Breast cancer screening by mammogram Patient agrees to have screening for breast cancer and mammogram order placed - MM Digital Screening; Future  1. Gastroesophageal reflux disease, esophagitis presence not specified Continue pantoprazole and Pepcid.  Avoid known trigger foods, avoid nonsteroidal anti-inflammatories when possible and avoid late night eating.  2. Right acute serous otitis media, recurrence not specified; 6.  Allergic rhinitis Prescription for loratadine to help with congestion and allergic rhinitis symptoms.  Prescription Augmentin 500 mg twice daily x7 days for serous otitis media.  Call or return if symptoms do not improve or worsen.  Patient may wish to take Claritin/loratadine on a daily basis as she also has complaint of a dry cough which may also be related to allergic rhinitis or may be reflux  related.  Patient may need repeat imaging or further evaluation if she continues to have the cough - loratadine (CLARITIN) 10 MG tablet; Take 1 tablet (10 mg total) by mouth daily.  Dispense: 30 tablet; Refill: 11 - amoxicillin-clavulanate (AUGMENTIN) 500-125 MG tablet; Take 1 tablet (500 mg total) by mouth 2 (two) times daily for 7 days. Take after eating  Dispense: 14 tablet; Refill: 0  3. Left hip pain Orthopedic referral placed in follow-up of left hip pain.  Discussed with patient that she had prior CT of the abdomen and pelvis which did make note of mild bilateral hip degenerative changes.  Imaging also showed some thoracic and lumbar degenerative changes and patient additionally could have radicular symptoms contributing to her hip pain - Ambulatory referral to Orthopedic Surgery  4. Varicose veins of bilateral lower extremities with other complications; 9.  Edema of both lower extremities due to venous insufficiency Patient with varicose veins and swelling of the bilateral lower extremities.  Discussed with patient that I believe that her swelling is related to venous insufficiency as well as presence of varicose veins.  She has been referred to vascular surgery for further evaluation and treatment.  Prescription provided for hydrochlorothiazide to see if this will help with swelling as patient also with elevated blood  pressure which may also be helped with use of hydrochlorothiazide. - Ambulatory referral to Vascular Surgery - hydrochlorothiazide (HYDRODIURIL) 25 MG tablet; Take 1 tablet (25 mg total) by mouth daily.  Dispense: 30 tablet; Refill: 3  5. Recurrent dry cough Patient with complaint of recurrent dry cough and request prescription for Tessalon Perles which she has tried in the past.  Patient also with evidence of allergic rhinitis on exam for which loratadine 10 mg was prescribed to help with current serous otitis media as well as chronic allergic rhinitis symptoms.  Patient's cough  may be related to chronic postnasal drainage/allergic rhinitis.  She also however has acid reflux/GERD which can also cause coughing.  Patient however also has past history of smoking/cigarette use and may need chest x-ray and/or low-dose CT scan for further evaluation if she has a continued cough. - benzonatate (TESSALON) 100 MG capsule; Take 1 capsule (100 mg total) by mouth 2 (two) times daily as needed for cough.  Dispense: 20 capsule; Refill: 0  7. Elevated blood pressure reading Blood pressure elevated at today's visit and patient also with peripheral edema.  Discussed with patient that I would like for her to start hydrochlorothiazide which may help with her lower extremity edema as well as lower her blood pressure.  Also discussed DASH diet and increased activity as tolerated. - hydrochlorothiazide (HYDRODIURIL) 25 MG tablet; Take 1 tablet (25 mg total) by mouth daily.  Dispense: 30 tablet; Refill: 3   An After Visit Summary was printed and given to the patient.  Follow-up: Return in about 6 weeks (around 08/02/2019) for edema/BP- 2 weeks wiht Luke; 6 weeks.   Antony Blackbird, MD

## 2019-06-21 NOTE — Patient Instructions (Signed)
Varicose Veins Varicose veins are veins that have become enlarged, bulged, and twisted. They most often appear in the legs. What are the causes? This condition is caused by damage to the valves in the vein. These valves help blood return to your heart. When they are damaged and they stop working properly, blood may flow backward and back up in the veins near the skin, causing the veins to get larger and appear twisted. The condition can result from any issue that causes blood to back up, like pregnancy, prolonged standing, or obesity. What increases the risk? This condition is more likely to develop in people who are:  On their feet a lot.  Pregnant.  Overweight. What are the signs or symptoms? Symptoms of this condition include:  Bulging, twisted, and bluish veins.  A feeling of heaviness. This may be worse at the end of the day.  Leg pain. This may be worse at the end of the day.  Swelling in the leg.  Changes in skin color over the veins. How is this diagnosed? This condition may be diagnosed based on your symptoms, a physical exam, and an ultrasound test. How is this treated? Treatment for this condition may involve:  Avoiding sitting or standing in one position for long periods of time.  Wearing compression stockings. These stockings help to prevent blood clots and reduce swelling in the legs.  Raising (elevating) the legs when resting.  Losing weight.  Exercising regularly. If you have persistent symptoms or want to improve the way your varicose veins look, you may choose to have a procedure to close the varicose veins off or to remove them. Treatments to close off the veins include:  Sclerotherapy. In this treatment, a solution is injected into a vein to close it off.  Laser treatment. In this treatment, the vein is heated with a laser to close it off.  Radiofrequency vein ablation. In this treatment, an electrical current produced by radio waves is used to close  off the vein. Treatments to remove the veins include:  Phlebectomy. In this treatment, the veins are removed through small incisions made over the veins.  Vein ligation and stripping. In this treatment, incisions are made over the veins. The veins are then removed after being tied (ligated) with stitches (sutures). Follow these instructions at home: Activity  Walk as much as possible. Walking increases blood flow. This helps blood return to the heart and takes pressure off your veins. It also increases your cardiovascular strength.  Follow your health care provider's instructions about exercising.  Do not stand or sit in one position for a long period of time.  Do not sit with your legs crossed.  Rest with your legs raised during the day. General instructions   Follow any diet instructions given to you by your health care provider.  Wear compression stockings as directed by your health care provider. Do not wear other kinds of tight clothing around your legs, pelvis, or waist.  Elevate your legs at night to above the level of your heart.  If you get a cut in the skin over the varicose vein and the vein bleeds: ? Lie down with your leg raised. ? Apply firm pressure to the cut with a clean cloth until the bleeding stops. ? Place a bandage (dressing) on the cut. Contact a health care provider if:  The skin around your varicose veins starts to break down.  You have pain, redness, tenderness, or hard swelling over a vein.  You   are uncomfortable because of pain.  You get a cut in the skin over a varicose vein and it will not stop bleeding. Summary  Varicose veins are veins that have become enlarged, bulged, and twisted. They most often appear in the legs.  This condition is caused by damage to the valves in the vein. These valves help blood return to your heart.  Treatment for this condition includes frequent movements, wearing compression stockings, losing weight, and  exercising regularly. In some cases, procedures are done to close off or remove the veins.  Treatment for this condition may include wearing compression stockings, elevating the legs, losing weight, and engaging in regular activity. In some cases, procedures are done to close off or remove the veins. This information is not intended to replace advice given to you by your health care provider. Make sure you discuss any questions you have with your health care provider. Document Released: 07/20/2005 Document Revised: 12/06/2018 Document Reviewed: 11/02/2016 Elsevier Patient Education  2020 Elsevier Inc.  

## 2019-06-22 LAB — HEPATIC FUNCTION PANEL
ALT: 23 IU/L (ref 0–32)
AST: 19 IU/L (ref 0–40)
Albumin: 4.8 g/dL (ref 3.8–4.9)
Alkaline Phosphatase: 100 IU/L (ref 39–117)
Bilirubin Total: 0.2 mg/dL (ref 0.0–1.2)
Bilirubin, Direct: 0.06 mg/dL (ref 0.00–0.40)
Total Protein: 7.4 g/dL (ref 6.0–8.5)

## 2019-07-05 ENCOUNTER — Encounter: Payer: Self-pay | Admitting: Pharmacist

## 2019-07-05 ENCOUNTER — Ambulatory Visit: Payer: Self-pay | Attending: Family Medicine | Admitting: Pharmacist

## 2019-07-05 ENCOUNTER — Other Ambulatory Visit: Payer: Self-pay

## 2019-07-05 VITALS — BP 120/79 | HR 97

## 2019-07-05 DIAGNOSIS — I10 Essential (primary) hypertension: Secondary | ICD-10-CM

## 2019-07-05 NOTE — Progress Notes (Signed)
   S:    PCP: Dr. Chapman Fitch   Patient arrives in good spirits.  Presents to the clinic for hypertension evaluation, counseling, and management.  Patient was referred and last seen by Primary Care Provider on 06/21/19.   Patient reports adherence with medications.   Denies chest pain, dyspnea, HA or blurred vision.  Current BP Medications include:  HCTZ 25 mg daily  Dietary habits include: does not limit salt; denies drinking caffeine  Exercise habits include: limited d/t hip pain Family / Social history:  - FHx: HLD, HTN (mother) - tobacco: former smoker - alcohol: denies current use  O:  L arm after 5 minutes rest: 120/79, HR 97  Home BP readings:  - Wide range observed on home monitor - SBP: 118 - 142 - DBP: 70 - 80s - Of note, her home BP cuff is a standard adult size cuff which is too small for her arm. This may contribute to falsely high BP readings at home.   Last 3 Office BP readings: BP Readings from Last 3 Encounters:  07/05/19 120/79  06/21/19 123/82  02/04/19 110/74   BMET    Component Value Date/Time   NA 135 01/11/2019 0218   K 3.6 01/11/2019 0218   CL 106 01/11/2019 0218   CO2 20 (L) 01/11/2019 0218   GLUCOSE 103 (H) 01/11/2019 0218   BUN 21 (H) 01/11/2019 0218   CREATININE 0.86 01/11/2019 0218   CREATININE 0.73 05/01/2013 1654   CALCIUM 9.3 01/11/2019 0218   GFRNONAA >60 01/11/2019 0218   GFRAA >60 01/11/2019 0218   Renal function: CrCl cannot be calculated (Patient's most recent lab result is older than the maximum 21 days allowed.).  Clinical ASCVD: No  The ASCVD Risk score Mikey Bussing DC Jr., et al., 2013) failed to calculate for the following reasons:   Cannot find a previous HDL lab   Cannot find a previous total cholesterol lab  A/P: Hypertension longstanding currently controlled  on current medications. BP Goal = <130/80 mmHg. Patient is adherent with current medications. Advised her to obtain larger adult size BP cuff for home use.  -Continued  HCTZ 25 mg daily.  -Counseled on lifestyle modifications for blood pressure control including reduced dietary sodium, increased exercise, adequate sleep  Results reviewed and written information provided.  Total time in face-to-face counseling 15 minutes.   F/U Clinic Visit in 1 month.    Patient seen with: Dillard Essex PharmD Candidate  Class of 2022 [place], PharmD, [place] [phone]

## 2019-07-07 ENCOUNTER — Other Ambulatory Visit: Payer: Self-pay

## 2019-07-07 ENCOUNTER — Emergency Department (HOSPITAL_COMMUNITY)
Admission: EM | Admit: 2019-07-07 | Discharge: 2019-07-07 | Disposition: A | Discharge status: Home / Self Care | Payer: Self-pay | Attending: Emergency Medicine | Admitting: Emergency Medicine

## 2019-07-07 ENCOUNTER — Encounter (HOSPITAL_COMMUNITY): Payer: Self-pay

## 2019-07-07 DIAGNOSIS — Z79899 Other long term (current) drug therapy: Secondary | ICD-10-CM | POA: Insufficient documentation

## 2019-07-07 DIAGNOSIS — Z87891 Personal history of nicotine dependence: Secondary | ICD-10-CM | POA: Insufficient documentation

## 2019-07-07 DIAGNOSIS — H60501 Unspecified acute noninfective otitis externa, right ear: Secondary | ICD-10-CM | POA: Insufficient documentation

## 2019-07-07 MED ORDER — OFLOXACIN 0.3 % OT SOLN: 5.0000 [drp] | Freq: Two times a day (BID) | OTIC | 0 refills | Status: DC

## 2019-07-07 NOTE — Discharge Instructions (Signed)
You can take Tylenol or Ibuprofen as directed for pain. You can alternate Tylenol and Ibuprofen every 4 hours. If you take Tylenol at 1pm, then you can take Ibuprofen at 5pm. Then you can take Tylenol again at 9pm.   Use eardrops as directed.  Follow-up with the ENT doctor if any worsening or concerning symptoms.  Return the emergency department for any worsening symptoms, fever, redness or swelling of face or any other worsening concerning symptoms.

## 2019-07-07 NOTE — ED Provider Notes (Signed)
[facility] DEPT Provider Note   CSN: [id number] Arrival date & time: 07/07/19  1756     History   Chief Complaint Chief Complaint  Patient presents with  . Otalgia    HPI [name] is a 58 y.o. female who presents for evaluation of 2 weeks of persistent right ear pain.  She saw her PCP at onset of symptoms who prescribed her Augmentin and told her to take Claritin.  She reports that she has been taking both and finished her course of antibiotics but still has left ear pain.  She describes it as a fullness sensation.  She feels like there is water or something in the ear.  She states she occasionally will notice some drainage.  She has not stuck anything in the ear.  She feels like the right side of her face is swollen.  She has not noted any fevers.  Patient has not noticed any overlying warmth, erythema of the face.     The history is provided by the patient.    Past Medical History:  Diagnosis Date  . Allergy    uses Albuterol inhaler during allergy season  . Diverticulosis 2013   Colonoscopy  . GERD (gastroesophageal reflux disease)   . Hiatal hernia    CT abdomen in August 2013  . Hx of colonic polyps 2013   Colonoscopy   . Osteoarthritis    b/l hip, seen on CT abd in 05/2012    Patient Active Problem List   Diagnosis Date Noted  . Protein-calorie malnutrition, severe 01/13/2019  . Dehydration 01/10/2019  . Hypercalcemia 01/10/2019  . Sinus bradycardia 01/10/2019  . Difficulty swallowing 01/10/2019  . LLQ abdominal pain 01/10/2019  . Calculus of gallbladder without cholecystitis without obstruction 12/31/2018  . History of nicotine dependence 12/31/2018  . Hiccups 12/31/2018  . Dysphagia 12/31/2018  . History of asthma 12/31/2018  . Rash and nonspecific skin eruption 05/01/2013  . DJD (degenerative joint disease) of knee 11/19/2012  . HLD (hyperlipidemia) 11/13/2012  . Osteoarthritis of left knee 07/26/2012  . GERD  (gastroesophageal reflux disease) 06/15/2012  . Health care maintenance 06/15/2012    Past Surgical History:  Procedure Laterality Date  . BIOPSY  01/12/2019   Procedure: BIOPSY;  Surgeon: Jackquline Denmark, MD;  Location: [facility] ENDOSCOPY;  Service: Endoscopy;;  . ESOPHAGOGASTRODUODENOSCOPY (EGD) WITH PROPOFOL N/A 01/12/2019   Procedure: ESOPHAGOGASTRODUODENOSCOPY (EGD) WITH PROPOFOL;  Surgeon: Jackquline Denmark, MD;  Location[facility] ENDOSCOPY;  Service: Endoscopy;  Laterality: N/A;  Venia Minks DILATION  01/12/2019   Procedure: Venia Minks DILATION;  Surgeon: Jackquline Denmark, MD;  Location: [facility] ENDOSCOPY;  Service: Endoscopy;;  . TONSILLECTOMY     childhood  . TOTAL ABDOMINAL HYSTERECTOMY W/ BILATERAL SALPINGOOPHORECTOMY  2008   Fibroids, done by Dr. Clovia Cuff at [facility]     OB History    Gravida  3   Para  3   Term      Preterm      AB      Living        SAB      TAB      Ectopic      Multiple      Live Births               Home Medications    Prior to Admission medications   Medication Sig Start Date End Date Taking? Authorizing Provider  albuterol (PROVENTIL HFA;VENTOLIN HFA) 108 (90 BASE) MCG/ACT inhaler Inhale 2 puffs into  the lungs every 6 (six) hours as needed. For shortness of breath    [provider]  aspirin 81 MG chewable tablet Chew 81 mg by mouth. Pt taking "some" days    [provider]  benzonatate (TESSALON) 100 MG capsule Take 1 capsule (100 mg total) by mouth 2 (two) times daily as needed for cough. 06/21/19   Fulp, Cammie, MD  famotidine (PEPCID) 20 MG tablet Take 1 tablet by mouth once to twice daily 06/21/19   Fulp, Cammie, MD  hydrochlorothiazide (HYDRODIURIL) 25 MG tablet Take 1 tablet (25 mg total) by mouth daily. 06/21/19   Fulp, Cammie, MD  loratadine (CLARITIN) 10 MG tablet Take 1 tablet (10 mg total) by mouth daily. 06/21/19   Fulp, Cammie, MD  Multiple Vitamin (MULTIVITAMIN WITH MINERALS) TABS tablet Take 1 tablet by mouth daily.     [provider]  ofloxacin (FLOXIN) 0.3 % OTIC solution Place 5 drops into the right ear 2 (two) times daily. 07/07/19   Volanda Napoleon, PA-C  pantoprazole (PROTONIX) 40 MG tablet Take one tablet by mouth once to twice daily 06/21/19   Fulp, Cammie, MD  polyethylene glycol (MIRALAX / GLYCOLAX) 17 g packet Take 17 g by mouth 2 (two) times daily as needed. 02/07/19   Armbruster, Carlota Raspberry, MD    Family History Family History  Problem Relation Age of Onset  . Stroke Father        passed away at 3 yo of cerebal brain hemorrhage   . Hypertension Mother   . Hyperlipidemia Mother   . Colon cancer Neg Hx   . Stomach cancer Neg Hx   . Colon polyps Neg Hx   . Rectal cancer Neg Hx     Social History Social History   Tobacco Use  . Smoking status: Former Smoker    Packs/day: 0.50    Years: 31.00    Pack years: 15.50    Types: Cigarettes  . Smokeless tobacco: Never Used  Substance Use Topics  . Alcohol use: Not Currently    Alcohol/week: 2.0 standard drinks    Types: 2 Glasses of wine per week    Comment: 1-2 beers/week  . Drug use: No     Allergies   Patient has no known allergies.   Review of Systems Review of Systems  Constitutional: Negative for fever.  HENT: Positive for ear pain and facial swelling.   Gastrointestinal: Negative for vomiting.  All other systems reviewed and are negative.    Physical Exam Updated Vital Signs BP 120/86 (BP Location: Left Arm)   Pulse 87   Temp 98.3 F (36.8 C) (Oral)   Resp 16   Ht 5\' 10"  (1.778 m)   Wt 111.1 kg   SpO2 97%   BMI 35.15 kg/m   Physical Exam Vitals signs and nursing note reviewed.  Constitutional:      Appearance: She is well-developed.  HENT:     Head: Normocephalic and atraumatic.     Comments: Face is symmetric in appearance without any overlying warmth, erythema, edema.    Right Ear: Tympanic membrane normal. No mastoid tenderness.     Left Ear: Tympanic membrane normal. No mastoid tenderness.      Ears:     Comments: Right TM is without any erythema, bulging, perforation.  There is some mild erythema noted to external auditory canal.  She does have pain with movement of tragus.  Left TM is unremarkable.  No warmth, overlying erythema noted to mastoid process  bilaterally.  No tenderness to palpation noted to mastoid.    Mouth/Throat:     Dentition: Normal dentition. No dental abscesses.     Comments: No swelling noted to neck, chin.  No evidence of abscess or submandibular swelling.  No dental caries or dental abscess noted. Eyes:     General: No scleral icterus.       Right eye: No discharge.        Left eye: No discharge.     Conjunctiva/sclera: Conjunctivae normal.  Pulmonary:     Effort: Pulmonary effort is normal.  Skin:    General: Skin is warm and dry.  Neurological:     Mental Status: She is alert.  Psychiatric:        Speech: Speech normal.        Behavior: Behavior normal.      ED Treatments / Results  Labs (all labs ordered are listed, but only abnormal results are displayed) Labs Reviewed - No data to display  EKG None  Radiology No results found.  Procedures Procedures (including critical care time)  Medications Ordered in ED Medications - No data to display   Initial Impression / Assessment and Plan / ED Course  I have reviewed the triage vital signs and the nursing notes.  Pertinent labs & imaging results that were available during my care of the patient were reviewed by me and considered in my medical decision making (see chart for details).        58 year old female who presents for evaluation of persistent right ear pain.  Got antibiotics and Claritin by PCP but no improvement.  No fevers.  She feels like her face is slightly swollen.  Patient is afebrile, non-toxic appearing, sitting comfortably on examination table. Vital signs reviewed and stable.  On exam, she does have pain with movement of tragus.  External auditory canal slightly  erythematous.  TM is without any abnormalities.  Exam not concerning for mastoiditis, acute otitis media.  Additionally, oral exam is benign.  Question if this is acute otitis externa.  Will plan to treat with eardrops. At this time, patient exhibits no emergent life-threatening condition that require further evaluation in ED or admission. Patient had ample opportunity for questions and discussion. All patient's questions were answered with full understanding. Strict return precautions discussed. Patient expresses understanding and agreement to plan.   Portions of this note were generated with Lobbyist. Dictation errors may occur despite best attempts at proofreading.   Final Clinical Impressions(s) / ED Diagnoses   Final diagnoses:  Acute otitis externa of right ear, unspecified type    ED Discharge Orders         Ordered    ofloxacin (FLOXIN) 0.3 % OTIC solution  2 times daily     07/07/19 2129           Desma Mcgregor 07/08/19 [id number]    Davonna Belling, MD 07/08/19 [id number]

## 2019-07-07 NOTE — ED Notes (Signed)
An After Visit Summary was printed and given to the patient. Discharge instructions given and no further questions at this time time

## 2019-07-07 NOTE — ED Triage Notes (Signed)
Patient c/o right ear pain and feeling full. Patient states she saw her PCP 3 weeks ago. Patient received RX for Loratadine and Amox-Clav 500 mg. Patient states symptoms have not changed.

## 2019-07-09 ENCOUNTER — Telehealth: Payer: Self-pay | Admitting: *Deleted

## 2019-07-09 NOTE — Telephone Encounter (Signed)
Spoke with patient and she stated that she just picked up the ear drop yesterday and this morning was her 2nd time using it. Per pt from what she can tell, the drops are working so far. Staff informed patient that if she feels like the drops are not working to feel free to contact the office and patient agreed.

## 2019-07-16 ENCOUNTER — Telehealth: Payer: Self-pay | Admitting: Family Medicine

## 2019-07-16 NOTE — Telephone Encounter (Signed)
New Message  Pt states the medication that she was given for her ear infection is not working. Please f/u

## 2019-07-17 ENCOUNTER — Other Ambulatory Visit: Payer: Self-pay | Admitting: Family Medicine

## 2019-07-17 DIAGNOSIS — H9201 Otalgia, right ear: Secondary | ICD-10-CM

## 2019-07-17 NOTE — Telephone Encounter (Signed)
Pt is now home please call back when available

## 2019-07-17 NOTE — Progress Notes (Signed)
Patient ID: [name], female   DOB: 06-26-1961, 58 y.o.   MRN: [id number]   58 year old female with complaint of recurrent right ear pain.  Urgent ENT referral placed.  We will also see if office appointment available and if not patient will be instructed to go to urgent care and follow-up

## 2019-07-17 NOTE — Telephone Encounter (Signed)
Per Patient mother, she isn't in. Left message to return call.

## 2019-07-17 NOTE — Telephone Encounter (Signed)
Patient name and DOB verified. Patient aware of message per Dr. Chapman Fitch. Verbalized understanding.

## 2019-07-17 NOTE — Telephone Encounter (Signed)
Please have front desk see if there is an appointment available and have patient come in to be seen and otherwise, please ask patient if she can go to urgent care and I will also place an ENT referral

## 2019-07-24 ENCOUNTER — Other Ambulatory Visit: Payer: Self-pay

## 2019-07-24 DIAGNOSIS — I83899 Varicose veins of unspecified lower extremities with other complications: Secondary | ICD-10-CM

## 2019-07-25 ENCOUNTER — Ambulatory Visit: Payer: Self-pay

## 2019-07-29 ENCOUNTER — Other Ambulatory Visit: Payer: Self-pay

## 2019-07-29 ENCOUNTER — Ambulatory Visit (HOSPITAL_COMMUNITY)
Admission: RE | Admit: 2019-07-29 | Discharge: 2019-07-29 | Disposition: A | Discharge status: Home / Self Care | Payer: Self-pay | Source: Ambulatory Visit | Attending: Family | Admitting: Family

## 2019-07-29 ENCOUNTER — Ambulatory Visit (INDEPENDENT_AMBULATORY_CARE_PROVIDER_SITE_OTHER): Payer: Self-pay | Admitting: Physician Assistant

## 2019-07-29 DIAGNOSIS — I83899 Varicose veins of unspecified lower extremities with other complications: Secondary | ICD-10-CM | POA: Insufficient documentation

## 2019-07-29 DIAGNOSIS — I8393 Asymptomatic varicose veins of bilateral lower extremities: Secondary | ICD-10-CM | POA: Insufficient documentation

## 2019-07-29 DIAGNOSIS — M25552 Pain in left hip: Secondary | ICD-10-CM

## 2019-07-29 DIAGNOSIS — I872 Venous insufficiency (chronic) (peripheral): Secondary | ICD-10-CM

## 2019-07-29 HISTORY — DX: Pain in left hip: M25.552

## 2019-07-29 NOTE — Progress Notes (Signed)
Requested by:  Antony Blackbird, MD [place],  [place] [id number]  Reason for consultation: bilateral    History of Present Illness   [name] is a 58 y.o. (05-17-1961) female who was referred for evaluation of bilateral lower extremity edema and varicose veins.  Patient states that when she was recently prescribed a diuretic.  Her lower extremity edema has nearly resolved and she is not bothered at all by her varicose veins of bilateral lower extremities.  Patient's main concern is pain over her left hip with referred pain across her left groin.  This pain has been increasing over the past couple months and has been causing her to limp.  She states that this area is really stiff when she first starts walking however loosens up over the course of the day.  She also describes left hip popping and catching occasionally.  She was once told she had arthritis of her left hip.  In the past she was treated for left knee arthritis and received a joint injection.  Patient states she has not been able to see a orthopedic surgeon because she lost her health benefits when she was laid off due to [employer].  She has no history of DVT.  She admittedly does not make an effort to elevate her legs during the day.  She has never worn compression stockings.  Past Medical History:  Diagnosis Date  . Allergy    uses Albuterol inhaler during allergy season  . Diverticulosis 2013   Colonoscopy  . GERD (gastroesophageal reflux disease)   . Hiatal hernia    CT abdomen in August 2013  . Hx of colonic polyps 2013   Colonoscopy   . Osteoarthritis    b/l hip, seen on CT abd in 05/2012    Past Surgical History:  Procedure Laterality Date  . BIOPSY  01/12/2019   Procedure: BIOPSY;  Surgeon: Jackquline Denmark, MD;  Location: [facility] ENDOSCOPY;  Service: Endoscopy;;  . ESOPHAGOGASTRODUODENOSCOPY (EGD) WITH PROPOFOL N/A 01/12/2019   Procedure: ESOPHAGOGASTRODUODENOSCOPY (EGD) WITH PROPOFOL;  Surgeon:  Jackquline Denmark, MD;  Location: [facility] ENDOSCOPY;  Service: Endoscopy;  Laterality: N/A;  Venia Minks DILATION  01/12/2019   Procedure: Venia Minks DILATION;  Surgeon: Jackquline Denmark, MD;  Location: [facility] ENDOSCOPY;  Service: Endoscopy;;  . TONSILLECTOMY     childhood  . TOTAL ABDOMINAL HYSTERECTOMY W/ BILATERAL SALPINGOOPHORECTOMY  2008   Fibroids, done by Dr. Clovia Cuff at [place] History  . Marital status: Single    Spouse name: Not on file  . Number of children: 3  . Years of education: 12th  . Highest education level: Not on file  Occupational History  . Occupation: ASS. CATHETERS    Employer: [employer]    Comment: layed off  Social Needs  . Financial resource strain: Not on file  . Food insecurity    Worry: Not on file    Inability: Not on file  . Transportation needs    Medical: Not on file    Non-medical: Not on file  Tobacco Use  . Smoking status: Former Smoker    Packs/day: 0.50    Years: 31.00    Pack years: 15.50    Types: Cigarettes  . Smokeless tobacco: Never Used  Substance and Sexual Activity  . Alcohol use: Not Currently    Alcohol/week: 2.0 standard drinks    Types: 2 Glasses of wine per week    Comment: 1-2  beers/week  . Drug use: No  . Sexual activity: Yes    Partners: Male    Comment: Same partner for 11 years  Lifestyle  . Physical activity    Days per week: Not on file    Minutes per session: Not on file  . Stress: Not on file  Relationships  . Social Herbalist on phone: Not on file    Gets together: Not on file    Attends religious service: Not on file    Active member of club or organization: Not on file    Attends meetings of clubs or organizations: Not on file    Relationship status: Not on file  . Intimate partner violence    Fear of current or ex partner: Not on file    Emotionally abused: Not on file    Physically abused: Not on file    Forced sexual activity: Not on file  Other Topics  Concern  . Not on file  Social History Narrative   Live with her mother   Has three kids - all grown - all in [place] area   4 grandchildren   [place] at home - 83 years    Family History  Problem Relation Age of Onset  . Stroke Father        passed away at 57 yo of cerebal brain hemorrhage   . Hypertension Mother   . Hyperlipidemia Mother   . Colon cancer Neg Hx   . Stomach cancer Neg Hx   . Colon polyps Neg Hx   . Rectal cancer Neg Hx     Current Outpatient Medications  Medication Sig Dispense Refill  . albuterol (PROVENTIL HFA;VENTOLIN HFA) 108 (90 BASE) MCG/ACT inhaler Inhale 2 puffs into the lungs every 6 (six) hours as needed. For shortness of breath    . aspirin 81 MG chewable tablet Chew 81 mg by mouth. Pt taking "some" days    . benzonatate (TESSALON) 100 MG capsule Take 1 capsule (100 mg total) by mouth 2 (two) times daily as needed for cough. 20 capsule 0  . famotidine (PEPCID) 20 MG tablet Take 1 tablet by mouth once to twice daily 60 tablet 5  . hydrochlorothiazide (HYDRODIURIL) 25 MG tablet Take 1 tablet (25 mg total) by mouth daily. 30 tablet 3  . loratadine (CLARITIN) 10 MG tablet Take 1 tablet (10 mg total) by mouth daily. 30 tablet 11  . Multiple Vitamin (MULTIVITAMIN WITH MINERALS) TABS tablet Take 1 tablet by mouth daily.    Marland Kitchen ofloxacin (FLOXIN) 0.3 % OTIC solution Place 5 drops into the right ear 2 (two) times daily. 5 mL 0  . pantoprazole (PROTONIX) 40 MG tablet Take one tablet by mouth once to twice daily 60 tablet 03  . polyethylene glycol (MIRALAX / GLYCOLAX) 17 g packet Take 17 g by mouth 2 (two) times daily as needed.  0   No current facility-administered medications for this visit.     No Known Allergies  REVIEW OF SYSTEMS (negative unless checked):   Cardiac:  []  Chest pain or chest pressure? []  Shortness of breath upon activity? []  Shortness of breath when lying flat? []  Irregular heart rhythm?  Vascular:  []  Pain in calf, thigh,  or hip brought on by walking? []  Pain in feet at night that wakes you up from your sleep? []  Blood clot in your veins? []  Leg swelling?  Pulmonary:  []  Oxygen at home? []  Productive cough? []  Wheezing?  Neurologic:  []   Sudden weakness in arms or legs? []  Sudden numbness in arms or legs? []  Sudden onset of difficult speaking or slurred speech? []  Temporary loss of vision in one eye? []  Problems with dizziness?  Gastrointestinal:  []  Blood in stool? []  Vomited blood?  Genitourinary:  []  Burning when urinating? []  Blood in urine?  Psychiatric:  []  Major depression  Hematologic:  []  Bleeding problems? []  Problems with blood clotting?  Dermatologic:  []  Rashes or ulcers?  Constitutional:  []  Fever or chills?  Ear/Nose/Throat:  []  Change in hearing? []  Nose bleeds? []  Sore throat?  Musculoskeletal:  []  Back pain? [x]  Joint pain? []  Muscle pain?   Physical Examination     Vitals:   07/29/19 1405  BP: 122/75  Pulse: 81  Resp: 16  SpO2: 99%  Weight: 257 lb (116.6 kg)  Height: 5' 10.5" (1.791 m)   Body mass index is 36.35 kg/m.  General Alert, O x 3, WD, NAD  Head /AT,    Neck Supple, mid-line trachea,    Pulmonary Sym exp, good B air movt, CTA B  Cardiac RRR, Nl S1, S2,  Vascular Vessel Right Left  Radial Palpable Palpable  Brachial Palpable Palpable  Popliteal Not palpable Not palpable  PT Faintly palpable Faintly palpable  DP Not palpable Not palpable    Gastro- intestinal soft, non-distended, non-tender to palpation,   Musculo- skeletal M/S 5/5 throughout  , Extremities without ischemic changes  , No edema present, Varicose veins noted bilateral shins, right medial distal thigh, left lateral calf, No Lipodermatosclerosis present  Neurologic Cranial nerves 2-12 intact , Pain and light touch intact in extremities , Motor exam as listed above  Psychiatric Judgement intact, Mood & affect appropriate for pt's clinical situation  Dermatologic  See M/S exam for extremity exam, No rashes otherwise noted  Lymphatic  Palpable lymph nodes: None    Non-invasive Vascular Imaging   BLE Venous Insufficiency Duplex   Bifid superficial system right lower extremity with reflux noted in both branches  Negative for DVT of bilateral lower extremities  Negative for deep venous reflux bilateral lower extremities  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 Trula Slade

## 2019-08-05 ENCOUNTER — Encounter: Payer: Self-pay | Admitting: Surgery

## 2019-08-14 ENCOUNTER — Encounter: Payer: Self-pay | Admitting: Family Medicine

## 2019-08-14 ENCOUNTER — Ambulatory Visit: Payer: Self-pay | Attending: Family Medicine | Admitting: Family Medicine

## 2019-08-14 DIAGNOSIS — G8929 Other chronic pain: Secondary | ICD-10-CM

## 2019-08-14 DIAGNOSIS — I10 Essential (primary) hypertension: Secondary | ICD-10-CM

## 2019-08-14 DIAGNOSIS — I872 Venous insufficiency (chronic) (peripheral): Secondary | ICD-10-CM

## 2019-08-14 DIAGNOSIS — M25552 Pain in left hip: Secondary | ICD-10-CM

## 2019-08-14 DIAGNOSIS — R600 Localized edema: Secondary | ICD-10-CM

## 2019-08-14 NOTE — Progress Notes (Signed)
Patient verified DOB Patient has eaten today. Patient has taken medication today. Patient complains of Left side Hip pain being present and described as achy and throbbing. Pain has been present since April. Patient denies any swelling at this time.

## 2019-08-14 NOTE — Progress Notes (Signed)
Virtual Visit via Telephone Note  I connected with [name]  on 08/14/19 at 11:10 AM EDT by telephone and verified that I am speaking with the correct person using two identifiers.   I discussed the limitations, risks, security and privacy concerns of performing an evaluation and management service by telephone and the availability of in person appointments. I also discussed with the patient that there may be a patient responsible charge related to this service. The patient expressed understanding and agreed to proceed.  Patient Location: Home Provider Location: [facility] Others participating in call: call initiated by [place], CMA   History of Present Illness:         58 yo female seen in follow-up of recent medical issues including hypertension, ear pain, varicose veins of the lower extremities as well as lower extremity edema and patient with complaint of continued issues with catching, popping and pain in her left hip.  Since her last visit, she reports that she did go to the vascular doctor and has been prescribed compression stockings and she was told about a place that she can obtain them in [place] for about $20 but she has been unable to reach anyone at that location.  She denies any increased leg pain or increase in leg swelling.  She did also see an ear nose and throat doctor, Dr. Redmond Baseman, after an ED visit on 07/07/2019 as her ear pain from her last visit here had not resolved.  She reports that she did receive a prescription from the ear doctor for a 7-day course of prednisone as well as as a nasal spray.  She did have some improvement in her ear pain with the use of these medications and also mild improvement in hip pain while she was on the prednisone.  Since completing the prednisone, she again has the chronic sharp pain in her hip as well as occasional locking sensation and popping.  She reports that her pain in her hip is between a 7-8 on a 0-to-10 scale with 0 being no pain  and 10 being the worst pain possible.  She reports that she was on Vioxx and Celebrex in the past and she believes that these medications may have been recalled.  She states that both of these medications did help with her joint pain in the past.  She continues to have issues with low back pain, bilateral knee pain as well as current pain in the left hip.  She did meet with the clinical pharmacist after her last visit regarding her blood and she reports that her blood pressure is now well controlled on her current medication pressure        On review of systems, she does not have any headaches or dizziness, no chest pain or palpitations, no shortness of breath or cough.  She still continues to have lower extremity edema.  She also has some mild fatigue.  No abdominal pain-no nausea/vomiting/diarrhea or constipation.  No urinary frequency or dysuria.   Past Medical History:  Diagnosis Date  . Allergy    uses Albuterol inhaler during allergy season  . Diverticulosis 2013   Colonoscopy  . GERD (gastroesophageal reflux disease)   . Hiatal hernia    CT abdomen in August 2013  . Hx of colonic polyps 2013   Colonoscopy   . Osteoarthritis    b/l hip, seen on CT abd in 05/2012    Past Surgical History:  Procedure Laterality Date  . BIOPSY  01/12/2019  Procedure: BIOPSY;  Surgeon: Jackquline Denmark, MD;  Location: [facility] ENDOSCOPY;  Service: Endoscopy;;  . ESOPHAGOGASTRODUODENOSCOPY (EGD) WITH PROPOFOL N/A 01/12/2019   Procedure: ESOPHAGOGASTRODUODENOSCOPY (EGD) WITH PROPOFOL;  Surgeon: Jackquline Denmark, MD;  Location: [facility] ENDOSCOPY;  Service: Endoscopy;  Laterality: N/A;  Venia Minks DILATION  01/12/2019   Procedure: Venia Minks DILATION;  Surgeon: Jackquline Denmark, MD;  Location: [facility] ENDOSCOPY;  Service: Endoscopy;;  . TONSILLECTOMY     childhood  . TOTAL ABDOMINAL HYSTERECTOMY W/ BILATERAL SALPINGOOPHORECTOMY  2008   Fibroids, done by Dr. Clovia Cuff at [facility]    Family History  Problem Relation Age of Onset   . Stroke Father        passed away at 12 yo of cerebal brain hemorrhage   . Hypertension Mother   . Hyperlipidemia Mother   . Colon cancer Neg Hx   . Stomach cancer Neg Hx   . Colon polyps Neg Hx   . Rectal cancer Neg Hx     Social History   Tobacco Use  . Smoking status: Former Smoker    Packs/day: 0.50    Years: 31.00    Pack years: 15.50    Types: Cigarettes  . Smokeless tobacco: Never Used  Substance Use Topics  . Alcohol use: Not Currently    Alcohol/week: 2.0 standard drinks    Types: 2 Glasses of wine per week    Comment: 1-2 beers/week  . Drug use: No     No Known Allergies     Observations/Objective: No vital signs or physical exam conducted as visit was done via telephone  Assessment and Plan: 1. Essential hypertension Patient will continue the use of hydrochlorothiazide for control of hypertension.  Notes reviewed from patient's recent visit with the clinical pharmacist regarding blood pressure follow-up show that her blood pressure is well controlled.  Blood pressure at her visit was 120/79.  Patient will have BMP at her next visit in follow-up of use of medication for treatment of hypertension.  2. Chronic left hip pain Patient reports that she did receive pain relief in the past with use of Celebrex.  Prescription will be sent to her pharmacy for Celebrex 200 mg to take once daily as needed for hip pain.  Patient is in the process of applying for the financial assistance program through this office and will call back once she has completed this process regarding referral to orthopedics for further evaluation of her hip, knee and back pain.  3. 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  Patient has varicose veins as well as venous insufficiency and patient will obtain recommended compression hose.  Follow Up Instructions:Return in about 4 months (around 12/15/2019) for  Chronic issues.    I discussed the assessment and treatment plan with the patient. The patient was provided an opportunity to ask questions and all were answered. The patient agreed with the plan and demonstrated an understanding of the instructions.   The patient was advised to call back or seek an in-person evaluation if the symptoms worsen or if the condition fails to improve as anticipated.  I provided 12 minutes of non-face-to-face time during this encounter.   Antony Blackbird, MD

## 2019-08-15 ENCOUNTER — Telehealth: Payer: Self-pay | Admitting: Family Medicine

## 2019-08-15 ENCOUNTER — Other Ambulatory Visit: Payer: Self-pay | Admitting: Family Medicine

## 2019-08-15 DIAGNOSIS — G8929 Other chronic pain: Secondary | ICD-10-CM

## 2019-08-15 DIAGNOSIS — M25552 Pain in left hip: Secondary | ICD-10-CM

## 2019-08-15 MED ORDER — CELECOXIB 200 MG PO CAPS: 200.0000 mg | ORAL_CAPSULE | Freq: Every day | ORAL | 1 refills | Status: DC

## 2019-08-15 MED FILL — CELECOXIB 200 MG CAP: 200 | 30 days supply | Qty: 30 | Fill #0

## 2019-08-15 NOTE — Telephone Encounter (Signed)
Celebrex RX has been sent to pharmacy

## 2019-08-15 NOTE — Progress Notes (Signed)
Patient ID: [name], female   DOB: 11-09-1960, 58 y.o.   MRN: [id number]    Rx for celebrex 200 mg once per day as needed for pain sent to [facility] per patient request

## 2019-08-15 NOTE — Telephone Encounter (Signed)
Patient came in stating she was expecting to be prescribed a medication (celebrax 200 mg). Please follow up.   States she would like it sent to [facility]

## 2019-08-16 NOTE — Telephone Encounter (Signed)
LM with patient daughter to inform pt that office called her back and she agreed

## 2019-08-22 ENCOUNTER — Telehealth: Payer: Self-pay | Admitting: Family Medicine

## 2019-08-22 NOTE — Telephone Encounter (Signed)
Patient called stating she still has an ear infection. Please follow up

## 2019-08-23 ENCOUNTER — Other Ambulatory Visit: Payer: Self-pay | Admitting: Family Medicine

## 2019-08-23 DIAGNOSIS — H6983 Other specified disorders of Eustachian tube, bilateral: Secondary | ICD-10-CM

## 2019-08-23 DIAGNOSIS — H6993 Unspecified Eustachian tube disorder, bilateral: Secondary | ICD-10-CM

## 2019-08-23 MED ORDER — FLUTICASONE PROPIONATE 50 MCG/ACT NA SUSP: 2.0000 | Freq: Every day | NASAL | 6 refills | Status: DC

## 2019-08-23 MED ORDER — PREDNISONE 10 MG PO TABS: ORAL_TABLET | ORAL | 0 refills | Status: DC

## 2019-08-23 NOTE — Progress Notes (Signed)
Patient ID: [name], female   DOB: Nov 06, 1960, 58 y.o.   MRN: [id number]   Patient phone call received that she believes that she has an ear infection as she is having ear pain. Her recent ENT note was reviewed and ENT felt that her ear pain was due to eustachian tube dysfunction. Will send in a short course of prednisone and steroid nasal spray.

## 2019-08-23 NOTE — Telephone Encounter (Signed)
  Patient was recently seen by ENT and the doctor did not feel that she an ear infection. ENT wanted her to take a short course of steroids and use a nasal spray and I can send that in but if her ear pain is worsening then she may want to call for an appointment or try to be seen at urgent care and she can also call ENT

## 2019-08-26 NOTE — Telephone Encounter (Signed)
Left message with patient mother to have patient call office back

## 2019-08-26 NOTE — Telephone Encounter (Signed)
Informed patient with what provider stated and she verbalized understanding. Per pt she have an appt with the ENT doctor on 09-02-19.

## 2019-08-30 ENCOUNTER — Telehealth (HOSPITAL_COMMUNITY): Payer: Self-pay

## 2019-08-30 NOTE — Telephone Encounter (Signed)
Telephoned patient at home. Person that answered will give patient the message to call [facility].

## 2020-03-10 ENCOUNTER — Encounter: Payer: Self-pay | Admitting: Sports Medicine

## 2020-03-10 ENCOUNTER — Other Ambulatory Visit: Payer: Self-pay

## 2020-03-10 ENCOUNTER — Ambulatory Visit (INDEPENDENT_AMBULATORY_CARE_PROVIDER_SITE_OTHER): Payer: Self-pay | Admitting: Sports Medicine

## 2020-03-10 VITALS — BP 128/80 | Ht 70.0 in | Wt 250.0 lb

## 2020-03-10 DIAGNOSIS — M170 Bilateral primary osteoarthritis of knee: Secondary | ICD-10-CM

## 2020-03-10 DIAGNOSIS — I872 Venous insufficiency (chronic) (peripheral): Secondary | ICD-10-CM

## 2020-03-10 MED ORDER — TRAMADOL HCL 50 MG PO TABS: 50.0000 mg | ORAL_TABLET | Freq: Two times a day (BID) | ORAL | 0 refills | Status: DC | PRN

## 2020-03-10 MED ORDER — METHYLPREDNISOLONE ACETATE 40 MG/ML IJ SUSP
40.0000 mg | Freq: Once | INTRAMUSCULAR | Status: AC
  Administered 2020-03-10: 40 mg via INTRA_ARTICULAR

## 2020-03-10 NOTE — Progress Notes (Signed)
   Subjective:    Patient ID: [name], female    DOB: [birth year], 59 y.o.   MRN: [id number]  HPI chief complaint: Bilateral knee and left hip pain  Very pleasant 59 year old female comes in today complaining of bilateral knee pain and swelling.  She has a known history of osteoarthritis.  X-rays of both knees as well as an MRI of the left knee in 2014 showed significant arthritis in both knees.  She did well with aspiration and injection at that time.  About 3 months ago her pain and swelling returned without any known inciting event.  She denies any recent trauma.  She is also beginning to experience some left hip pain which she localizes primarily to the lateral left hip.  She has been given a prescription for Celebrex but finds that [facility] powders work better.  She is concerned about stomach upset with this.  Tylenol is ineffective.  Interim medical history reviewed Medications reviewed Allergies reviewed    Review of Systems As above    Objective:   Physical Exam  Well-developed, well-nourished.  No acute distress  Left hip: Extremely limited internal rotation passively.  Limited external rotation by about 50%.  No tenderness to palpation.  Right hip: Smooth painless hip range of motion  Examination of both knees shows range of motion from 0 to 110 degrees.  1+ effusion.  Bony hypertrophy consistent with DJD.  Neurovascular intact distally.      Assessment & Plan:   Returning bilateral knee pain secondary to DJD Left hip pain likely secondary to DJD  Each of her knees were injected with cortisone today.  An anterior lateral approach was utilized.  Patient tolerates this without difficulty.  I have also given her a limited supply of tramadol to take as needed for pain.  She may take this in addition to her Celebrex.  She is currently without insurance but we provided her with information on how to apply for the financial assistance program at [facility].  Once she has coverage she  will call my office and I will get updated x-rays of both knees and her left hip.  Definitive treatment for both of her knees is a total knee arthroplasty and I think that she will need that sooner rather than later.  Consent obtained and verified. Time-out conducted. Noted no overlying erythema, induration, or other signs of local infection. Skin prepped in a sterile fashion. Topical analgesic spray: Ethyl chloride. Joint: left knee Needle: 25g 1.5 inch Completed without difficulty. Meds: 3cc 1% xylocaine, 1cc (40mg ) depomedrol  Advised to call if fevers/chills, erythema, induration, drainage, or persistent bleeding.  Consent obtained and verified. Time-out conducted. Noted no overlying erythema, induration, or other signs of local infection. Skin prepped in a sterile fashion. Topical analgesic spray: Ethyl chloride. Joint: right knee Needle: 25g 1.5 inch Completed without difficulty. Meds: 3cc 1% xylocaine, 1cc (40mg ) depomedrol  Advised to call if fevers/chills, erythema, induration, drainage, or persistent bleeding.

## 2020-04-06 ENCOUNTER — Other Ambulatory Visit: Payer: Self-pay

## 2020-04-06 ENCOUNTER — Ambulatory Visit (INDEPENDENT_AMBULATORY_CARE_PROVIDER_SITE_OTHER): Payer: Self-pay | Admitting: Sports Medicine

## 2020-04-06 VITALS — BP 132/78 | Ht 70.0 in | Wt 235.0 lb

## 2020-04-06 DIAGNOSIS — M25552 Pain in left hip: Secondary | ICD-10-CM

## 2020-04-06 DIAGNOSIS — M25559 Pain in unspecified hip: Secondary | ICD-10-CM

## 2020-04-06 MED ORDER — METHYLPREDNISOLONE ACETATE 80 MG/ML IJ SUSP
80.0000 mg | Freq: Once | INTRAMUSCULAR | Status: AC
  Administered 2020-04-06: 80 mg via INTRAMUSCULAR

## 2020-04-07 ENCOUNTER — Ambulatory Visit
Admission: RE | Admit: 2020-04-07 | Discharge: 2020-04-07 | Disposition: A | Discharge status: Home / Self Care | Payer: No Typology Code available for payment source | Source: Ambulatory Visit | Attending: Sports Medicine | Admitting: Sports Medicine

## 2020-04-07 ENCOUNTER — Other Ambulatory Visit: Payer: Self-pay | Admitting: *Deleted

## 2020-04-07 DIAGNOSIS — M25552 Pain in left hip: Secondary | ICD-10-CM

## 2020-04-07 NOTE — Progress Notes (Addendum)
   Subjective:    Patient ID: [name], female    DOB: 1960-11-12, 59 y.o.   MRN: [id number]  HPI chief complaint: Left hip pain  Patient comes in today complaining of left hip pain that localizes to the groin and lateral hip.  She was last seen in the office a little less than a month ago.  Main complaint at that time was knee pain secondary to osteoarthritis.  That pain resolved with recent cortisone injections.  It was also felt at that visit that she may have left hip arthritis.  I recommended x-rays at that time but she was without insurance.  She is currently awaiting word back from the financial assistance program but she seems to be optimistic that she will be approved.    Review of Systems Above    Objective:   Physical Exam  Well-developed, well-nourished.  No acute distress  Left hip: Severely reduced passive internal and external rotation.  This does reproduce pain.  Slight tenderness over the greater trochanteric bursa.  Neurovascular intact distally.  Walking with an antalgic gait.      Assessment & Plan:   Left hip pain secondary to DJD  I have ordered a standing AP pelvis as well as a lateral left hip to evaluate the degree of osteoarthritis.  We injected her with 80 mg of Depo-Medrol IM today.  I will call her when I review her x-rays.  If her arthritis is advanced then I would consider referral to Dr. Ninfa Linden to discuss merits of total hip arthroplasty.  Addendum: X-rays reviewed.  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  I will refer the patient to Dr. Ninfa Linden to discuss total hip arthroplasty.

## 2020-04-15 ENCOUNTER — Encounter: Payer: Self-pay | Admitting: Sports Medicine

## 2020-04-15 ENCOUNTER — Ambulatory Visit (INDEPENDENT_AMBULATORY_CARE_PROVIDER_SITE_OTHER): Payer: Self-pay | Admitting: Sports Medicine

## 2020-04-15 ENCOUNTER — Other Ambulatory Visit: Payer: Self-pay

## 2020-04-15 VITALS — BP 122/70 | Ht 70.0 in | Wt 235.0 lb

## 2020-04-15 DIAGNOSIS — M1612 Unilateral primary osteoarthritis, left hip: Secondary | ICD-10-CM

## 2020-04-15 MED ORDER — METHYLPREDNISOLONE ACETATE 80 MG/ML IJ SUSP
80.0000 mg | Freq: Once | INTRAMUSCULAR | Status: AC
  Administered 2020-04-15: 80 mg via INTRA_ARTICULAR

## 2020-04-15 NOTE — Patient Instructions (Signed)
The injection into your hip should help with some of the pain and inflammation you are having due to your arthritis.  This injection will take several days to work.  Later this afternoon you may notice your pain temporarily improves until the numbing medicine wears off.  After that the steroid will start to work, but this may not happen until the weekend.  If you are sore later tonight you can ice the hip. -Keep your appointment with orthopedic surgery to discuss a hip replacement  If you have any questions or concerns prior to your appointment with the orthopedic surgeon, please not hesitate to contact us

## 2020-04-15 NOTE — Progress Notes (Addendum)
PCP: Antony Blackbird, MD  Subjective:   HPI: Patient is a 59 y.o. female here for ultrasound-guided injection of the left hip.  Patient was recently seen by Dr. Micheline Chapman and diagnosed with left hip osteoarthritis.  She had x-rays done showing severe arthritis of the left hip.  She returns today to have corticosteroid injection done.  Patient notes most the pain is in her groin.  Occasionally get pain rating down her leg and into her buttocks.  No significant changes in her symptoms since her last visit with Dr. Micheline Chapman.   Review of Systems: See HPI above.  Past Medical History:  Diagnosis Date  . Allergy    uses Albuterol inhaler during allergy season  . Diverticulosis 2013   Colonoscopy  . GERD (gastroesophageal reflux disease)   . Hiatal hernia    CT abdomen in August 2013  . Hx of colonic polyps 2013   Colonoscopy   . Osteoarthritis    b/l hip, seen on CT abd in 05/2012    Current Outpatient Medications on File Prior to Visit  Medication Sig Dispense Refill  . albuterol (PROVENTIL HFA;VENTOLIN HFA) 108 (90 BASE) MCG/ACT inhaler Inhale 2 puffs into the lungs every 6 (six) hours as needed. For shortness of breath    . aspirin 81 MG chewable tablet Chew 81 mg by mouth. Pt taking "some" days    . celecoxib (CELEBREX) 200 MG capsule Take 1 capsule (200 mg total) by mouth daily. As needed for pain; take after eating 90 capsule 1  . famotidine (PEPCID) 20 MG tablet Take 1 tablet by mouth once to twice daily 60 tablet 5  . fluticasone (FLONASE) 50 MCG/ACT nasal spray Place 2 sprays into both nostrils daily. 16 g 6  . hydrochlorothiazide (HYDRODIURIL) 25 MG tablet Take 1 tablet (25 mg total) by mouth daily. 30 tablet 3  . loratadine (CLARITIN) 10 MG tablet Take 1 tablet (10 mg total) by mouth daily. 30 tablet 11  . Multiple Vitamin (MULTIVITAMIN WITH MINERALS) TABS tablet Take 1 tablet by mouth daily.    . pantoprazole (PROTONIX) 40 MG tablet Take one tablet by mouth once to twice daily 60  tablet 03  . polyethylene glycol (MIRALAX / GLYCOLAX) 17 g packet Take 17 g by mouth 2 (two) times daily as needed.  0  . predniSONE (DELTASONE) 10 MG tablet Take 5 pills on the first day then 4 pills next day, then 3 pills, 2 pills then 1 pill; take after eating 15 tablet 0  . traMADol (ULTRAM) 50 MG tablet Take 1 tablet (50 mg total) by mouth 2 (two) times daily as needed (for pain). 10 tablet 0   No current facility-administered medications on file prior to visit.    Past Surgical History:  Procedure Laterality Date  . BIOPSY  01/12/2019   Procedure: BIOPSY;  Surgeon: Jackquline Denmark, MD;  Location: [facility] ENDOSCOPY;  Service: Endoscopy;;  . ESOPHAGOGASTRODUODENOSCOPY (EGD) WITH PROPOFOL N/A 01/12/2019   Procedure: ESOPHAGOGASTRODUODENOSCOPY (EGD) WITH PROPOFOL;  Surgeon: Jackquline Denmark, MD;  Location: [facility] ENDOSCOPY;  Service: Endoscopy;  Laterality: N/A;  Venia Minks DILATION  01/12/2019   Procedure: Venia Minks DILATION;  Surgeon: Jackquline Denmark, MD;  Location: [facility] ENDOSCOPY;  Service: Endoscopy;;  . TONSILLECTOMY     childhood  . TOTAL ABDOMINAL HYSTERECTOMY W/ BILATERAL SALPINGOOPHORECTOMY  2008   Fibroids, done by Dr. Clovia Cuff at [facility]    No Known Allergies  Social History   Socioeconomic History  . Marital status: Single  Spouse name: Not on file  . Number of children: 3  . Years of education: 12th  . Highest education level: Not on file  Occupational History  . Occupation: ASS. CATHETERS    Employer: [employer]    Comment: layed off  Tobacco Use  . Smoking status: Former Smoker    Packs/day: 0.50    Years: 31.00    Pack years: 15.50    Types: Cigarettes  . Smokeless tobacco: Never Used  Vaping Use  . Vaping Use: Never used  Substance and Sexual Activity  . Alcohol use: Not Currently    Alcohol/week: 2.0 standard drinks    Types: 2 Glasses of wine per week    Comment: 1-2 beers/week  . Drug use: No  . Sexual activity: Yes    Partners: Male    Comment: Same  partner for 11 years  Other Topics Concern  . Not on file  Social History Narrative   Live with her mother   Has three kids - all grown - all in [place] area   4 grandchildren   [place] at home - 11 years   Social Determinants of Radio broadcast assistant Strain:   . Difficulty of Paying Living Expenses:   Food Insecurity:   . Worried About Charity fundraiser in the Last Year:   . Arboriculturist in the Last Year:   Transportation Needs:   . Film/video editor (Medical):   Marland Kitchen Lack of Transportation (Non-Medical):   Physical Activity:   . Days of Exercise per Week:   . Minutes of Exercise per Session:   Stress:   . Feeling of Stress :   Social Connections:   . Frequency of Communication with Friends and Family:   . Frequency of Social Gatherings with Friends and Family:   . Attends Religious Services:   . Active Member of Clubs or Organizations:   . Attends Archivist Meetings:   Marland Kitchen Marital Status:   Intimate Partner Violence:   . Fear of Current or Ex-Partner:   . Emotionally Abused:   Marland Kitchen Physically Abused:   . Sexually Abused:     Family History  Problem Relation Age of Onset  . Stroke Father        passed away at 64 yo of cerebal brain hemorrhage   . Hypertension Mother   . Hyperlipidemia Mother   . Colon cancer Neg Hx   . Stomach cancer Neg Hx   . Colon polyps Neg Hx   . Rectal cancer Neg Hx         Objective:  Physical Exam: BP 122/70   Ht 5\' 10"  (1.778 m)   Wt 235 lb (106.6 kg)   BMI 33.72 kg/m  Gen: NAD, comfortable in exam room Lungs: Breathing comfortably on room air Hip Exam left -Inspection: No deformity, no discoloration -Palpation: SI joint: non-tender; greater trochanter: non-tender -ROM: Pain with internal rotation and flexion of the hip -Special Tests:Log roll: Positive -Limb neurovascularly intact    Assessment & Plan:  Patient is a 59 y.o. female here for left hip osteoarthritis  1.  Left hip  osteoarthritis -Consent was obtained for corticosteroid injection of the left hip.  A timeout was performed prior to the procedure.  First the skin was cleaned and prepped using alcohol wipe.  Next 3 cc of lidocaine were injected in the skin overlying the hip.  Following this the skin was again cleaned and prepped using an additional alcohol  wipe.  Next 80 mg Depo-Medrol and 4 cc of lidocaine were injected into the hip using ultrasound guidance and sterile technique.  Patient tolerated the injection well, there were no complications. -Patient will keep her appointment with orthopedic surgery to discuss hip replacement  Patient will follow up as needed  I was the preceptor for this visit and available for immediate consultation Shellia Cleverly, DO

## 2020-04-15 NOTE — Addendum Note (Signed)
Addended by: Jolinda Croak E on: 04/15/2020 02:19 PM   Modules accepted: Orders

## 2020-07-01 ENCOUNTER — Ambulatory Visit: Payer: Self-pay | Admitting: Orthopaedic Surgery

## 2020-09-02 ENCOUNTER — Encounter: Payer: Self-pay | Admitting: Family Medicine

## 2020-09-02 ENCOUNTER — Ambulatory Visit: Payer: Self-pay | Attending: Family Medicine | Admitting: Family Medicine

## 2020-09-02 ENCOUNTER — Other Ambulatory Visit: Payer: Self-pay | Admitting: Family Medicine

## 2020-09-02 ENCOUNTER — Other Ambulatory Visit: Payer: Self-pay

## 2020-09-02 VITALS — BP 124/77 | HR 84 | Wt 263.0 lb

## 2020-09-02 DIAGNOSIS — Z1231 Encounter for screening mammogram for malignant neoplasm of breast: Secondary | ICD-10-CM

## 2020-09-02 DIAGNOSIS — I8393 Asymptomatic varicose veins of bilateral lower extremities: Secondary | ICD-10-CM

## 2020-09-02 DIAGNOSIS — Z0000 Encounter for general adult medical examination without abnormal findings: Secondary | ICD-10-CM

## 2020-09-02 DIAGNOSIS — R350 Frequency of micturition: Secondary | ICD-10-CM

## 2020-09-02 DIAGNOSIS — R82998 Other abnormal findings in urine: Secondary | ICD-10-CM

## 2020-09-02 DIAGNOSIS — G8929 Other chronic pain: Secondary | ICD-10-CM

## 2020-09-02 DIAGNOSIS — M25561 Pain in right knee: Secondary | ICD-10-CM

## 2020-09-02 DIAGNOSIS — M25562 Pain in left knee: Secondary | ICD-10-CM

## 2020-09-02 DIAGNOSIS — Z1211 Encounter for screening for malignant neoplasm of colon: Secondary | ICD-10-CM

## 2020-09-02 DIAGNOSIS — K2100 Gastro-esophageal reflux disease with esophagitis, without bleeding: Secondary | ICD-10-CM

## 2020-09-02 DIAGNOSIS — M25552 Pain in left hip: Secondary | ICD-10-CM

## 2020-09-02 DIAGNOSIS — Z131 Encounter for screening for diabetes mellitus: Secondary | ICD-10-CM

## 2020-09-02 LAB — POCT URINALYSIS DIP (CLINITEK)
Bilirubin, UA: NEGATIVE
Glucose, UA: NEGATIVE mg/dL
Ketones, POC UA: NEGATIVE mg/dL
Nitrite, UA: NEGATIVE
POC PROTEIN,UA: NEGATIVE
Spec Grav, UA: 1.02
Urobilinogen, UA: 0.2 U/dL
pH, UA: 5.5

## 2020-09-02 LAB — POCT GLYCOSYLATED HEMOGLOBIN (HGB A1C): Hemoglobin A1C: 5.9 % — AB (ref 4.0–5.6)

## 2020-09-02 MED ORDER — PANTOPRAZOLE SODIUM 40 MG PO TBEC: DELAYED_RELEASE_TABLET | ORAL | 4 refills | Status: DC

## 2020-09-02 MED ORDER — FAMOTIDINE 20 MG PO TABS: ORAL_TABLET | ORAL | 4 refills | Status: DC

## 2020-09-02 MED ORDER — MELOXICAM 15 MG PO TABS: 15.0000 mg | ORAL_TABLET | Freq: Every day | ORAL | 4 refills | Status: DC

## 2020-09-02 NOTE — Progress Notes (Signed)
EDEMA IN KNEES REFILL ON PROTONIX AND PEPCID DISCUSS HIP XRAY FROM [place]

## 2020-09-02 NOTE — Progress Notes (Signed)
Established Patient Office Visit  Subjective:  Patient ID: [name], female    DOB: 1961-03-31  Age: 59 y.o. MRN: [id number]  CC:  Chief Complaint  Patient presents with  . Annual Exam    HPI [name] , 59 year old female who is scheduled for annual well exam.  She does not wish to have pelvic exam.  She is not interested in referral for colonoscopy but is willing to do fecal occult blood testing. She does need refill of medication for treatment of acid reflux and states that she would also like a new prescription for meloxicam which she has taken in the past to help with chronic hip and knee pain related to arthritis.  Past Medical History:  Diagnosis Date  . Allergy    uses Albuterol inhaler during allergy season  . Diverticulosis 2013   Colonoscopy  . GERD (gastroesophageal reflux disease)   . Hiatal hernia    CT abdomen in August 2013  . Hx of colonic polyps 2013   Colonoscopy   . Osteoarthritis    b/l hip, seen on CT abd in 05/2012    Past Surgical History:  Procedure Laterality Date  . BIOPSY  01/12/2019   Procedure: BIOPSY;  Surgeon: Jackquline Denmark, MD;  Location: [facility] ENDOSCOPY;  Service: Endoscopy;;  . ESOPHAGOGASTRODUODENOSCOPY (EGD) WITH PROPOFOL N/A 01/12/2019   Procedure: ESOPHAGOGASTRODUODENOSCOPY (EGD) WITH PROPOFOL;  Surgeon: Jackquline Denmark, MD;  Location: [facility] ENDOSCOPY;  Service: Endoscopy;  Laterality: N/A;  Venia Minks DILATION  01/12/2019   Procedure: Venia Minks DILATION;  Surgeon: Jackquline Denmark, MD;  Location: [facility] ENDOSCOPY;  Service: Endoscopy;;  . TONSILLECTOMY     childhood  . TOTAL ABDOMINAL HYSTERECTOMY W/ BILATERAL SALPINGOOPHORECTOMY  2008   Fibroids, done by Dr. Clovia Cuff at [facility]    Family History  Problem Relation Age of Onset  . Stroke Father        passed away at 83 yo of cerebal brain hemorrhage   . Hypertension Mother   . Hyperlipidemia Mother   . Colon cancer Neg Hx   . Stomach cancer Neg Hx   . Colon polyps Neg Hx   .  Rectal cancer Neg Hx     Social History   Socioeconomic History  . Marital status: Single    Spouse name: Not on file  . Number of children: 3  . Years of education: 12th  . Highest education level: Not on file  Occupational History  . Occupation: ASS. CATHETERS    Employer: [employer]    Comment: layed off  Tobacco Use  . Smoking status: Current Every Day Smoker    Packs/day: 0.50    Years: 31.00    Pack years: 15.50    Types: Cigarettes  . Smokeless tobacco: Never Used  Vaping Use  . Vaping Use: Never used  Substance and Sexual Activity  . Alcohol use: Not Currently    Alcohol/week: 2.0 standard drinks    Types: 2 Glasses of wine per week    Comment: 1-2 beers/week  . Drug use: No  . Sexual activity: Yes    Partners: Male    Comment: Same partner for 11 years  Other Topics Concern  . Not on file  Social History Narrative   Live with her mother   Has three kids - all grown - all in [place] area   4 grandchildren   Clearence Cheek at home - 46 years   Social Determinants of Radio broadcast assistant Strain:   .  Difficulty of Paying Living Expenses: Not on file  Food Insecurity:   . Worried About Charity fundraiser in the Last Year: Not on file  . Ran Out of Food in the Last Year: Not on file  Transportation Needs:   . Lack of Transportation (Medical): Not on file  . Lack of Transportation (Non-Medical): Not on file  Physical Activity:   . Days of Exercise per Week: Not on file  . Minutes of Exercise per Session: Not on file  Stress:   . Feeling of Stress : Not on file  Social Connections:   . Frequency of Communication with Friends and Family: Not on file  . Frequency of Social Gatherings with Friends and Family: Not on file  . Attends Religious Services: Not on file  . Active Member of Clubs or Organizations: Not on file  . Attends Archivist Meetings: Not on file  . Marital Status: Not on file  Intimate Partner Violence:   . Fear of Current  or Ex-Partner: Not on file  . Emotionally Abused: Not on file  . Physically Abused: Not on file  . Sexually Abused: Not on file    Outpatient Medications Prior to Visit  Medication Sig Dispense Refill  . albuterol (PROVENTIL HFA;VENTOLIN HFA) 108 (90 BASE) MCG/ACT inhaler Inhale 2 puffs into the lungs every 6 (six) hours as needed. For shortness of breath    . aspirin 81 MG chewable tablet Chew 81 mg by mouth. Pt taking "some" days    . celecoxib (CELEBREX) 200 MG capsule Take 1 capsule (200 mg total) by mouth daily. As needed for pain; take after eating 90 capsule 1  . famotidine (PEPCID) 20 MG tablet Take 1 tablet by mouth once to twice daily 60 tablet 5  . fluticasone (FLONASE) 50 MCG/ACT nasal spray Place 2 sprays into both nostrils daily. 16 g 6  . hydrochlorothiazide (HYDRODIURIL) 25 MG tablet Take 1 tablet (25 mg total) by mouth daily. 30 tablet 3  . loratadine (CLARITIN) 10 MG tablet Take 1 tablet (10 mg total) by mouth daily. 30 tablet 11  . Multiple Vitamin (MULTIVITAMIN WITH MINERALS) TABS tablet Take 1 tablet by mouth daily.    . pantoprazole (PROTONIX) 40 MG tablet Take one tablet by mouth once to twice daily 60 tablet 03  . traMADol (ULTRAM) 50 MG tablet Take 1 tablet (50 mg total) by mouth 2 (two) times daily as needed (for pain). 10 tablet 0  . polyethylene glycol (MIRALAX / GLYCOLAX) 17 g packet Take 17 g by mouth 2 (two) times daily as needed. (Patient not taking: Reported on 09/02/2020)  0  . predniSONE (DELTASONE) 10 MG tablet Take 5 pills on the first day then 4 pills next day, then 3 pills, 2 pills then 1 pill; take after eating (Patient not taking: Reported on 09/02/2020) 15 tablet 0   No facility-administered medications prior to visit.    No Known Allergies  ROS Review of Systems  Constitutional: Positive for fatigue. Negative for chills and fever.  HENT: Negative for sore throat and trouble swallowing.   Eyes: Negative for photophobia and visual disturbance.   Respiratory: Negative for cough and shortness of breath.   Cardiovascular: Negative for chest pain and palpitations.  Gastrointestinal: Negative for abdominal pain, blood in stool, constipation, diarrhea and nausea.       Acid reflux symptoms which are controlled with medication  Endocrine: Negative for polydipsia, polyphagia and polyuria.  Genitourinary: Negative for dysuria and frequency.  Musculoskeletal: Positive for arthralgias and gait problem. Negative for back pain.  Skin: Negative for rash and wound.  Neurological: Negative for dizziness and headaches.  Hematological: Negative for adenopathy. Does not bruise/bleed easily.  Psychiatric/Behavioral: Negative for suicidal ideas. The patient is not nervous/anxious.       Objective:    Physical Exam Vitals and nursing note reviewed.  Constitutional:      General: She is not in acute distress.    Appearance: Normal appearance. She is obese. She is not ill-appearing.  Neck:     Vascular: No carotid bruit.  Cardiovascular:     Rate and Rhythm: Normal rate and regular rhythm.  Pulmonary:     Effort: Pulmonary effort is normal.     Breath sounds: Normal breath sounds.  Abdominal:     Palpations: Abdomen is soft.     Tenderness: There is no abdominal tenderness. There is no right CVA tenderness, left CVA tenderness, guarding or rebound.  Genitourinary:    Comments: Pelvic/Pap declined at today's visit Musculoskeletal:        General: Tenderness (Bilateral joint line tenderness of the knees) present.     Cervical back: Normal range of motion and neck supple. No tenderness.     Right lower leg: No edema.     Left lower leg: No edema.  Lymphadenopathy:     Cervical: No cervical adenopathy.  Skin:    General: Skin is warm.  Neurological:     General: No focal deficit present.     Mental Status: She is alert and oriented to person, place, and time.  Psychiatric:        Mood and Affect: Mood normal.        Behavior: Behavior  normal.     BP 124/77 (BP Location: Right Arm, Patient Position: Sitting)   Pulse 84   Wt 263 lb (119.3 kg) Comment: UNABLE TO STAND  SpO2 96%   BMI 37.74 kg/m  Wt Readings from Last 3 Encounters:  04/15/20 235 lb (106.6 kg)  04/06/20 235 lb (106.6 kg)  03/10/20 250 lb (113.4 kg)     Health Maintenance Due  Topic Date Due  . Hepatitis C Screening  Never done  . COVID-19 Vaccine (1) Never done  . MAMMOGRAM  05/24/2012     Lab Results  Component Value Date   TSH 0.784 01/10/2019   Lab Results  Component Value Date   WBC 7.9 01/13/2019   HGB 12.0 01/13/2019   HCT 36.9 01/13/2019   MCV 86.4 01/13/2019   PLT 203 01/13/2019   Lab Results  Component Value Date   NA 135 01/11/2019   K 3.6 01/11/2019   CO2 20 (L) 01/11/2019   GLUCOSE 103 (H) 01/11/2019   BUN 21 (H) 01/11/2019   CREATININE 0.86 01/11/2019   BILITOT 0.2 06/21/2019   ALKPHOS 100 06/21/2019   AST 19 06/21/2019   ALT 23 06/21/2019   PROT 7.4 06/21/2019   ALBUMIN 4.8 06/21/2019   CALCIUM 9.3 01/11/2019   ANIONGAP 9 01/11/2019   Lab Results  Component Value Date   CHOL 263 (H) 07/26/2012   Lab Results  Component Value Date   HDL 41 07/26/2012   Lab Results  Component Value Date   LDLCALC 145 (H) 07/26/2012   Lab Results  Component Value Date   TRIG 384 (H) 07/26/2012   Lab Results  Component Value Date   CHOLHDL 6.4 07/26/2012   No results found for: HGBA1C    Assessment & Plan:  1. Breast cancer screening by mammogram She agrees to have order placed for screening mammogram.  2. Screening for colon cancer Colonoscopy and Cologuard testing discussed as well as fecal occult blood testing and patient agrees to have fecal occult blood test as a screening test for colon cancer she is aware that she will need referral to gastroenterology for colonoscopy if fecal occult blood test is positive and that she will also need yearly repeat fecal occult blood test. - Fecal occult blood,  imunochemical([facility])  3. Screening for diabetes mellitus (DM) As patient is nonfasting, she will have hemoglobin A1c as a screening test for diabetes. - HgB A1c  4. Urinary frequency Patient with complaint of urinary frequency and will have urinalysis to look for abnormalities which could signal possible urinary tract infection. - POCT URINALYSIS DIP (CLINITEK)  5. Chronic left hip pain New prescription provided for meloxicam which patient has taken in the past with good relief of left hip and bilateral knee pain. - meloxicam (MOBIC) 15 MG tablet; Take 1 tablet (15 mg total) by mouth daily. As needed for pain; take after eating  Dispense: 30 tablet; Refill: 4  7. varicose veins of bilateral lower extremities Patient with bilateral lower extremity varicose veins and she reports that she sometimes has discomfort.  She is encouraged to follow-up with her vascular doctors if she continues to have discomfort, worsening discomfort or worsening issues with venous insufficiency/swelling of the lower legs.  8. Chronic pain of both knees Prescription provided for Mobic which patient states that she has taken in the past with good relief of hip and knee pain related to osteoarthritis.  Patient is aware that this medication can cause an increase in the risk of GI bleed and gastritis.  She is to make sure that she takes her medications for acid reflux and that she eats prior to taking the meloxicam. - meloxicam (MOBIC) 15 MG tablet; Take 1 tablet (15 mg total) by mouth daily. As needed for pain; take after eating  Dispense: 30 tablet; Refill: 4  6. Gastroesophageal reflux disease with esophagitis Patient is provided with refill of her current medications for continued treatment of acid reflux including pantoprazole and Pepcid.  She is encouraged to avoid late night eating as well as avoidance of known trigger foods. - pantoprazole (PROTONIX) 40 MG tablet; Take one tablet by mouth once to twice  daily  Dispense: 60 tablet; Refill: 4 - famotidine (PEPCID) 20 MG tablet; Take 1 tablet by mouth once to twice daily  Dispense: 60 tablet; Refill: 4  10. Leukocytes in urine Patient with abnormal urinalysis with leukocytes in the urine and urine will be sent for culture.  Patient will be notified if further treatment is needed based on the results. - Urine Culture  11. Well female exam without gynecological exam Patient was scheduled for annual well exam but had other issues that she wished to discuss as well.  She did agree to have an order placed for mammogram as a screening test for breast cancer.  She also agreed to have order placed for fecal occult blood testing to screen for colon cancer.     Follow-up: Return in about 4 months (around 12/31/2020) for chronic issues; sooner if needed.    Antony Blackbird, MD

## 2020-09-03 ENCOUNTER — Ambulatory Visit (INDEPENDENT_AMBULATORY_CARE_PROVIDER_SITE_OTHER): Payer: Self-pay | Admitting: Sports Medicine

## 2020-09-03 ENCOUNTER — Encounter: Payer: Self-pay | Admitting: Sports Medicine

## 2020-09-03 VITALS — BP 138/79 | Ht 70.0 in | Wt 250.0 lb

## 2020-09-03 DIAGNOSIS — M25461 Effusion, right knee: Secondary | ICD-10-CM

## 2020-09-03 DIAGNOSIS — M1711 Unilateral primary osteoarthritis, right knee: Secondary | ICD-10-CM

## 2020-09-03 MED ORDER — METHYLPREDNISOLONE ACETATE 40 MG/ML IJ SUSP
40.0000 mg | Freq: Once | INTRAMUSCULAR | Status: AC
  Administered 2020-09-03: 40 mg via INTRA_ARTICULAR

## 2020-09-03 MED FILL — MELOXICAM 15 MG TABLET: 15 | 30 days supply | Qty: 30 | Fill #0

## 2020-09-03 MED FILL — FAMOTIDINE 20 MG TABS: 20 | 30 days supply | Qty: 60 | Fill #0

## 2020-09-03 MED FILL — PANTOPRAZOLE SOD DR 40 MG T: 40 | 30 days supply | Qty: 60 | Fill #0

## 2020-09-03 NOTE — Progress Notes (Signed)
   Office Visit Note   Patient: [name]           Date of Birth: 1960-12-20           MRN: [id number] Visit Date: 09/03/2020 Requested by: Antony Blackbird, MD [place],  [place] [address] PCP: Antony Blackbird, MD  Subjective: CC: Right knee Swelling/Pain  HPI: 59yo F presenting to clinic with concern of two weeks of right knee pain/swelling. Patient denies any new trauma, and states that she 'Isn't sure what she did' to cause her current symptom flare. She endorses a history of arthritis in her knees, and states she has had them drained and injected before. She is hoping to do the same today, as this has offered significant improvement for her in the past. She is otherwise well, and is not on any blood thinning medications.               ROS:   All other systems were reviewed and are negative.  Objective: Vital Signs: BP 138/79   Ht 5\' 10"  (1.778 m)   Wt 250 lb (113.4 kg)   BMI 35.87 kg/m   Physical Exam:  General:  Alert and oriented, in no acute distress. Pulm:  Breathing unlabored. Psy:  Normal mood, congruent affect. Skin:  Right knee with no bruising, rashes or erythema. Overlying skin intact.   RIGHT KNEE EXAM:  General: Antalgic Gait favoring right knee Standing exam: Minimal valgus deformity of bilateral knees.   Seated Exam:  Patellar crepitus with knee flexion/extension bilaterally.  Reduced ROM with knee flexion/extension due to effusion.   Palpation: Endorses tenderness to palpation over medial>lateral joint lines. No tenderness with palpation of patella or patellar tendon.   Supine exam: Significant effusion.  Ligamentous Exam:  No pain or laxity with anterior/posterior drawer.  No obvious Sag.  Pseudolaxity with valgus stress across bilateral knees.   Imaging: No results found.  Assessment & Plan: 59yo F presenting to clinic with concerns of 2 weeks of worsening R knee pain, swelling. Examination as above with significant effusion of  right knee. Patient has experienced this in the past, and had excellent improvement with aspiration of the joint.  - Aspiration and Steroid injection performed as described below. Patient tolerated the procedure very well, and voice immediate improvement of her symptoms during the anesthetic phase.  - Strict return precautions were discussed - Patient had no further questions or concerns today.      Procedures: Right Knee Aspiration and Cortisone Injection:  Risks and benefits of procedure discussed, Patient opted to proceed. Written Consent obtained.  Timeout performed.  Skin prepped in a sterile fashion with betadine before further cleansing with alcohol. Ethyl Chloride was used for topical analgesia.  Right Knee was injected with 4cc 1% Lidocaine without epinephrine via the suprapatellar approach using a 25G, 1.5in needle. Following this, 50cc of sanguinous fluid was drained via 18G needle. Syringe was removed from the needle, and 40mg  methylprednisolone was then injected into the joint.   Patient tolerated the injection well with no immediate complications. Aftercare instructions were discussed, and patient was given strict return precautions.   I was the preceptor for this visit and available for immediate consultation Shellia Cleverly, DO

## 2020-09-05 LAB — URINE CULTURE

## 2020-09-11 ENCOUNTER — Encounter: Payer: Self-pay | Admitting: Family Medicine

## 2020-10-09 MED FILL — MELOXICAM 15 MG TABLET: 15 | 30 days supply | Qty: 30 | Fill #1

## 2020-12-07 MED FILL — MELOXICAM 15 MG TABLET: 15 | 30 days supply | Qty: 30 | Fill #1

## 2020-12-23 ENCOUNTER — Ambulatory Visit: Payer: Self-pay | Admitting: Family Medicine

## 2021-01-20 ENCOUNTER — Encounter: Payer: Self-pay | Admitting: Physician Assistant

## 2021-01-20 ENCOUNTER — Other Ambulatory Visit: Payer: Self-pay | Admitting: Physician Assistant

## 2021-01-20 ENCOUNTER — Ambulatory Visit: Payer: Self-pay | Attending: Physician Assistant | Admitting: Physician Assistant

## 2021-01-20 ENCOUNTER — Other Ambulatory Visit: Payer: Self-pay

## 2021-01-20 DIAGNOSIS — M25561 Pain in right knee: Secondary | ICD-10-CM

## 2021-01-20 DIAGNOSIS — J309 Allergic rhinitis, unspecified: Secondary | ICD-10-CM

## 2021-01-20 DIAGNOSIS — J9801 Acute bronchospasm: Secondary | ICD-10-CM

## 2021-01-20 DIAGNOSIS — R030 Elevated blood-pressure reading, without diagnosis of hypertension: Secondary | ICD-10-CM

## 2021-01-20 DIAGNOSIS — G8929 Other chronic pain: Secondary | ICD-10-CM

## 2021-01-20 DIAGNOSIS — K2100 Gastro-esophageal reflux disease with esophagitis, without bleeding: Secondary | ICD-10-CM

## 2021-01-20 DIAGNOSIS — R7303 Prediabetes: Secondary | ICD-10-CM

## 2021-01-20 DIAGNOSIS — M25562 Pain in left knee: Secondary | ICD-10-CM

## 2021-01-20 DIAGNOSIS — M25552 Pain in left hip: Secondary | ICD-10-CM

## 2021-01-20 DIAGNOSIS — I83893 Varicose veins of bilateral lower extremities with other complications: Secondary | ICD-10-CM

## 2021-01-20 DIAGNOSIS — H6983 Other specified disorders of Eustachian tube, bilateral: Secondary | ICD-10-CM

## 2021-01-20 MED ORDER — LORATADINE 10 MG PO TABS: 10.0000 mg | ORAL_TABLET | Freq: Every day | ORAL | 11 refills | Status: DC

## 2021-01-20 MED ORDER — FAMOTIDINE 20 MG PO TABS
ORAL_TABLET | ORAL | 4 refills | Status: DC
Start: 2021-01-20 — End: 2021-01-20

## 2021-01-20 MED ORDER — FLUTICASONE PROPIONATE 50 MCG/ACT NA SUSP: 2.0000 | Freq: Every day | NASAL | 6 refills | Status: DC

## 2021-01-20 MED ORDER — HYDROCHLOROTHIAZIDE 25 MG PO TABS: 25.0000 mg | ORAL_TABLET | Freq: Every day | ORAL | 3 refills | Status: DC

## 2021-01-20 MED ORDER — PANTOPRAZOLE SODIUM 40 MG PO TBEC
DELAYED_RELEASE_TABLET | ORAL | 4 refills | Status: DC
Start: 2021-01-20 — End: 2021-01-20

## 2021-01-20 MED ORDER — ALBUTEROL SULFATE HFA 108 (90 BASE) MCG/ACT IN AERS: 2.0000 | INHALATION_SPRAY | Freq: Four times a day (QID) | RESPIRATORY_TRACT | 2 refills | Status: DC | PRN

## 2021-01-20 MED ORDER — MELOXICAM 15 MG PO TABS: 15.0000 mg | ORAL_TABLET | Freq: Every day | ORAL | 4 refills | Status: DC

## 2021-01-20 NOTE — Progress Notes (Signed)
Virtual Visit via Telephone Note  I connected with [name] on 01/20/21 at  2:50 PM EDT by telephone and verified that I am speaking with the correct person using two identifiers.  Location: Patient: home Provider: [facility] office   I discussed the limitations, risks, security and privacy concerns of performing an evaluation and management service by telephone and the availability of in person appointments. I also discussed with the patient that there may be a patient responsible charge related to this service. The patient expressed understanding and agreed to proceed.   History of Present Illness:  Patient needs RF on meds.  No new issues or concerns.      Observations/Objective:  NAD.  A&Ox3   Assessment and Plan: 1. Chronic left hip pain - meloxicam (MOBIC) 15 MG tablet; Take 1 tablet (15 mg total) by mouth daily. As needed for pain; take after eating  Dispense: 30 tablet; Refill: 4  2. Chronic pain of both knees - meloxicam (MOBIC) 15 MG tablet; Take 1 tablet (15 mg total) by mouth daily. As needed for pain; take after eating  Dispense: 30 tablet; Refill: 4  3. Gastroesophageal reflux disease with esophagitis without hemorrhage - pantoprazole (PROTONIX) 40 MG tablet; Take one tablet by mouth once to twice daily  Dispense: 60 tablet; Refill: 4 - famotidine (PEPCID) 20 MG tablet; Take 1 tablet by mouth once to twice daily  Dispense: 60 tablet; Refill: 4  4. Dysfunction of both eustachian tubes - fluticasone (FLONASE) 50 MCG/ACT nasal spray; Place 2 sprays into both nostrils daily.  Dispense: 16 g; Refill: 6  5. Varicose veins of bilateral lower extremities with other complications - hydrochlorothiazide (HYDRODIURIL) 25 MG tablet; Take 1 tablet (25 mg total) by mouth daily.  Dispense: 30 tablet; Refill: 3  6. Elevated blood pressure reading - hydrochlorothiazide (HYDRODIURIL) 25 MG tablet; Take 1 tablet (25 mg total) by mouth daily.  Dispense: 30 tablet; Refill: 3  7.  Bronchospasm - albuterol (VENTOLIN HFA) 108 (90 Base) MCG/ACT inhaler; Inhale 2 puffs into the lungs every 6 (six) hours as needed. For shortness of breath  Dispense: 18 g; Refill: 2  8. Allergic rhinitis, unspecified seasonality, unspecified trigger - loratadine (CLARITIN) 10 MG tablet; Take 1 tablet (10 mg total) by mouth daily.  Dispense: 30 tablet; Refill: 11   9. Prediabetes-I have had a lengthy discussion and provided education about insulin resistance and the intake of too much sugar/refined carbohydrates.  I have advised the patient to work at a goal of eliminating sugary drinks, candy, desserts, sweets, refined sugars, processed foods, and white carbohydrates.  The patient expresses understanding.     Follow Up Instructions: Needs to be assigned new PCP in about 2 months   I discussed the assessment and treatment plan with the patient. The patient was provided an opportunity to ask questions and all were answered. The patient agreed with the plan and demonstrated an understanding of the instructions.   The patient was advised to call back or seek an in-person evaluation if the symptoms worsen or if the condition fails to improve as anticipated.  I provided 11 minutes of non-face-to-face time during this encounter.   Cheryl Caldron, PA-C  Patient ID: [name], female   DOB: 06/04/1961, 60 y.o.   MRN: [id number]

## 2021-02-09 ENCOUNTER — Telehealth: Payer: Self-pay | Admitting: Physician Assistant

## 2021-02-09 NOTE — Telephone Encounter (Signed)
Pt states [employer] has sent papers concerning her disability. This was sent back in 08/2020, and has not been done.  Pt states it was faxed again on 4/11 with no response. Please advise [phone]

## 2021-02-12 NOTE — Telephone Encounter (Signed)
Pt was called and informed that records were sent on 02/10/21. I informed her to reach out to office to make sure they received records.

## 2021-02-18 ENCOUNTER — Other Ambulatory Visit: Payer: Self-pay

## 2021-02-18 ENCOUNTER — Ambulatory Visit: Payer: Self-pay | Admitting: Physician Assistant

## 2021-02-18 VITALS — BP 133/82 | HR 69 | Temp 98.2°F | Resp 18 | Ht 70.0 in | Wt 257.0 lb

## 2021-02-18 DIAGNOSIS — H65192 Other acute nonsuppurative otitis media, left ear: Secondary | ICD-10-CM

## 2021-02-18 DIAGNOSIS — J309 Allergic rhinitis, unspecified: Secondary | ICD-10-CM

## 2021-02-18 LAB — POC COVID19 BINAXNOW: SARS Coronavirus 2 Ag: NEGATIVE

## 2021-02-18 MED ORDER — AMOXICILLIN-POT CLAVULANATE 875-125 MG PO TABS
1.0000 | ORAL_TABLET | Freq: Two times a day (BID) | ORAL | 0 refills | Status: DC
  Filled 2021-02-18: qty 20, 10d supply, fill #0

## 2021-02-18 MED ORDER — CETIRIZINE HCL 10 MG PO TABS
10.0000 mg | ORAL_TABLET | Freq: Every day | ORAL | 11 refills | Status: DC
  Filled 2021-02-18: qty 30, 30d supply, fill #0

## 2021-02-18 MED FILL — Fluticasone Propionate Nasal Susp 50 MCG/ACT: NASAL | 30 days supply | Qty: 16 | Fill #0 | Status: AC

## 2021-02-18 NOTE — Progress Notes (Signed)
Patient has not eaten today or taken medication today. Patient complains of tinnitus and pain in the chest and jaw from coughing.

## 2021-02-18 NOTE — Progress Notes (Signed)
Established Patient Office Visit  Subjective:  Patient ID: [name], female    DOB: 06-20-1961  Age: 60 y.o. MRN: [id number]  CC:  Chief Complaint  Patient presents with  . Allergic Rhinitis     HPI [name] reports that she was seen by her primary care provider on January 20, 2021, at that time she did have complaints of allergic rhinitis.  Reports that she was started on Claritin.  Reports that she does not feel it has made any improvements, does believe the Claritin may have made it worse.  Reports that she has been having discomfort in her right ear and pain in her left ear, difficulty hearing, "feels she has water in both ears".  Reports clear nasal discharge and cough with clear sputum.  Endorses occasional shortness of breath with relief with her inhaler.  Denies measured fever, however does state that she had episodes of chills last week.  Has been using over-the-counter Mucinex with some relief of cough.  Reports 3 previous COVID vaccines with [place] booster in October 2021.  Denies sick contacts.    Past Medical History:  Diagnosis Date  . Allergy    uses Albuterol inhaler during allergy season  . Diverticulosis 2013   Colonoscopy  . GERD (gastroesophageal reflux disease)   . Hiatal hernia    CT abdomen in August 2013  . Hx of colonic polyps 2013   Colonoscopy   . Osteoarthritis    b/l hip, seen on CT abd in 05/2012    Past Surgical History:  Procedure Laterality Date  . BIOPSY  01/12/2019   Procedure: BIOPSY;  Surgeon: Jackquline Denmark, MD;  Location: [facility] ENDOSCOPY;  Service: Endoscopy;;  . ESOPHAGOGASTRODUODENOSCOPY (EGD) WITH PROPOFOL N/A 01/12/2019   Procedure: ESOPHAGOGASTRODUODENOSCOPY (EGD) WITH PROPOFOL;  Surgeon: Jackquline Denmark, MD;  Location: [facility] ENDOSCOPY;  Service: Endoscopy;  Laterality: N/A;  Venia Minks DILATION  01/12/2019   Procedure: Venia Minks DILATION;  Surgeon: Jackquline Denmark, MD;  Location: [facility] ENDOSCOPY;  Service: Endoscopy;;  . TONSILLECTOMY      childhood  . TOTAL ABDOMINAL HYSTERECTOMY W/ BILATERAL SALPINGOOPHORECTOMY  2008   Fibroids, done by Dr. Clovia Cuff at [facility]    Family History  Problem Relation Age of Onset  . Stroke Father        passed away at 77 yo of cerebal brain hemorrhage   . Hypertension Mother   . Hyperlipidemia Mother   . Colon cancer Neg Hx   . Stomach cancer Neg Hx   . Colon polyps Neg Hx   . Rectal cancer Neg Hx     Social History   Socioeconomic History  . Marital status: Single    Spouse name: Not on file  . Number of children: 3  . Years of education: 12th  . Highest education level: Not on file  Occupational History  . Occupation: ASS. CATHETERS    Employer: [employer]    Comment: layed off  Tobacco Use  . Smoking status: Current Every Day Smoker    Packs/day: 0.50    Years: 31.00    Pack years: 15.50    Types: Cigarettes  . Smokeless tobacco: Never Used  Vaping Use  . Vaping Use: Never used  Substance and Sexual Activity  . Alcohol use: Not Currently    Alcohol/week: 2.0 standard drinks    Types: 2 Glasses of wine per week    Comment: 1-2 beers/week  . Drug use: No  . Sexual activity: Yes  Partners: Male    Comment: Same partner for 11 years  Other Topics Concern  . Not on file  Social History Narrative   Live with her mother   Has three kids - all grown - all in [place] area   4 grandchildren   [place] at home - 74 years   Social Determinants of Radio broadcast assistant Strain: Not on [employer] Insecurity: Not on file  Transportation Needs: Not on file  Physical Activity: Not on file  Stress: Not on file  Social Connections: Not on file  Intimate Partner Violence: Not on file    Outpatient Medications Prior to Visit  Medication Sig Dispense Refill  . albuterol (VENTOLIN HFA) 108 (90 Base) MCG/ACT inhaler INHALE 2 PUFFS INTO THE LUNGS EVERY 6 (SIX) HOURS AS NEEDED. FOR SHORTNESS OF BREATH 18 g 2  . aspirin 81 MG chewable tablet Chew 81 mg by  mouth. Pt taking "some" days    . famotidine (PEPCID) 20 MG tablet TAKE 1 TABLET BY MOUTH ONCE TO TWICE DAILY 60 tablet 4  . fluticasone (FLONASE) 50 MCG/ACT nasal spray PLACE 2 SPRAYS INTO BOTH NOSTRILS DAILY. 16 g 6  . hydrochlorothiazide (HYDRODIURIL) 25 MG tablet TAKE 1 TABLET (25 MG TOTAL) BY MOUTH DAILY. 30 tablet 3  . meloxicam (MOBIC) 15 MG tablet TAKE 1 TABLET (15 MG TOTAL) BY MOUTH DAILY. AS NEEDED FOR PAIN; TAKE AFTER EATING 30 tablet 4  . Multiple Vitamin (MULTIVITAMIN WITH MINERALS) TABS tablet Take 1 tablet by mouth daily.    . pantoprazole (PROTONIX) 40 MG tablet TAKE ONE TABLET BY MOUTH ONCE TO TWICE DAILY 60 tablet 4  . polyethylene glycol (MIRALAX / GLYCOLAX) 17 g packet Take 17 g by mouth 2 (two) times daily as needed.  0  . loratadine (CLARITIN) 10 MG tablet TAKE 1 TABLET (10 MG TOTAL) BY MOUTH DAILY. 30 tablet 11   No facility-administered medications prior to visit.    No Known Allergies  ROS Review of Systems  Constitutional: Positive for chills and fatigue. Negative for fever.  HENT: Positive for congestion, ear pain, postnasal drip, rhinorrhea, sneezing and sore throat. Negative for ear discharge and trouble swallowing.   Eyes: Negative.   Respiratory: Positive for cough. Negative for shortness of breath.   Cardiovascular: Negative for chest pain.  Gastrointestinal: Negative for abdominal pain, diarrhea, nausea and vomiting.  Endocrine: Negative.   Genitourinary: Negative.   Musculoskeletal: Negative for myalgias.  Skin: Negative.   Allergic/Immunologic: Negative.   Neurological: Negative.   Hematological: Negative.   Psychiatric/Behavioral: Negative.       Objective:    Physical Exam Constitutional:      General: She is not in acute distress.    Appearance: Normal appearance. She is ill-appearing.  HENT:     Right Ear: Tympanic membrane, ear canal and external ear normal. Decreased hearing noted. No drainage.     Left Ear: Ear canal normal. Tympanic  membrane is erythematous and bulging.     Nose:     Right Turbinates: Swollen.     Left Turbinates: Swollen.     Mouth/Throat:     Mouth: Mucous membranes are moist.     Pharynx: Oropharynx is clear.  Eyes:     Extraocular Movements: Extraocular movements intact.     Conjunctiva/sclera: Conjunctivae normal.     Pupils: Pupils are equal, round, and reactive to light.  Cardiovascular:     Rate and Rhythm: Normal rate and regular rhythm.  Pulses: Normal pulses.     Heart sounds: Normal heart sounds.  Pulmonary:     Effort: Pulmonary effort is normal.     Breath sounds: Normal breath sounds. No wheezing.  Musculoskeletal:        General: Normal range of motion.     Cervical back: Normal range of motion and neck supple.  Lymphadenopathy:     Cervical: Cervical adenopathy present.  Skin:    General: Skin is warm and dry.  Neurological:     General: No focal deficit present.     Mental Status: She is alert and oriented to person, place, and time.  Psychiatric:        Mood and Affect: Mood normal.        Behavior: Behavior normal.        Thought Content: Thought content normal.        Judgment: Judgment normal.     BP 133/82 (BP Location: Left Arm, Patient Position: Sitting, Cuff Size: Normal)   Pulse 69   Temp 98.2 F (36.8 C) (Oral)   Resp 18   Ht 5\' 10"  (1.778 m)   Wt 257 lb (116.6 kg)   SpO2 98%   BMI 36.88 kg/m  Wt Readings from Last 3 Encounters:  02/18/21 257 lb (116.6 kg)  09/03/20 250 lb (113.4 kg)  09/02/20 263 lb (119.3 kg)     Health Maintenance Due  Topic Date Due  . Hepatitis C Screening  Never done  . COVID-19 Vaccine (1) Never done  . MAMMOGRAM  05/24/2012    There are no preventive care reminders to display for this patient.  Lab Results  Component Value Date   TSH 0.784 01/10/2019   Lab Results  Component Value Date   WBC 7.9 01/13/2019   HGB 12.0 01/13/2019   HCT 36.9 01/13/2019   MCV 86.4 01/13/2019   PLT 203 01/13/2019   Lab  Results  Component Value Date   NA 135 01/11/2019   K 3.6 01/11/2019   CO2 20 (L) 01/11/2019   GLUCOSE 103 (H) 01/11/2019   BUN 21 (H) 01/11/2019   CREATININE 0.86 01/11/2019   BILITOT 0.2 06/21/2019   ALKPHOS 100 06/21/2019   AST 19 06/21/2019   ALT 23 06/21/2019   PROT 7.4 06/21/2019   ALBUMIN 4.8 06/21/2019   CALCIUM 9.3 01/11/2019   ANIONGAP 9 01/11/2019   Lab Results  Component Value Date   CHOL 263 (H) 07/26/2012   Lab Results  Component Value Date   HDL 41 07/26/2012   Lab Results  Component Value Date   LDLCALC 145 (H) 07/26/2012   Lab Results  Component Value Date   TRIG 384 (H) 07/26/2012   Lab Results  Component Value Date   CHOLHDL 6.4 07/26/2012   Lab Results  Component Value Date   HGBA1C 5.9 (A) 09/02/2020      Assessment & Plan:   Problem List Items Addressed This Visit   None   Visit Diagnoses    Other non-recurrent acute nonsuppurative otitis media of left ear    -  Primary   Relevant Medications   amoxicillin-clavulanate (AUGMENTIN) 875-125 MG tablet   Allergic rhinitis, unspecified seasonality, unspecified trigger       Relevant Medications   cetirizine (ZYRTEC ALLERGY) 10 MG tablet   Other Relevant Orders   POC COVID-19 (Completed)      Meds ordered this encounter  Medications  . amoxicillin-clavulanate (AUGMENTIN) 875-125 MG tablet    Sig: Take 1 tablet  by mouth 2 (two) times daily.    Dispense:  20 tablet    Refill:  0    Order Specific Question:   Supervising Provider    Answer:   Shan Levans E [[id number]]  . cetirizine (ZYRTEC ALLERGY) 10 MG tablet    Sig: Take 1 tablet (10 mg total) by mouth daily.    Dispense:  30 tablet    Refill:  11    Stop claritin    Order Specific Question:   Supervising Provider    Answer:   Shan Levans E [[id number]]  1. Other non-recurrent acute nonsuppurative otitis media of left ear Trial Augmentin, discontinue Claritin, trial of Zyrtec, continue over-the-counter Mucinex as needed.  Patient  education given on proper hydration, rest.  Red flags given for prompt reevaluation. - amoxicillin-clavulanate (AUGMENTIN) 875-125 MG tablet; Take 1 tablet by mouth 2 (two) times daily.  Dispense: 20 tablet; Refill: 0  2. Allergic rhinitis, unspecified seasonality, unspecified trigger Rapid COVID testing negative - POC COVID-19 - cetirizine (ZYRTEC ALLERGY) 10 MG tablet; Take 1 tablet (10 mg total) by mouth daily.  Dispense: 30 tablet; Refill: 11   I have reviewed the patient's medical history (PMH, PSH, Social History, Family History, Medications, and allergies) , and have been updated if relevant. I spent 32 minutes reviewing chart and  face to face time with patient.    Follow-up: Return if symptoms worsen or fail to improve.    Kasandra Knudsen Mayers, PA-C

## 2021-02-18 NOTE — Patient Instructions (Addendum)
Your rapid COVID test was negative.  You will take Augmentin twice a day for the next 10 days.  I encourage you to start using the Flonase as directed as well as Zyrtec instead of Claritin.  I encourage you to continue using the Mucinex over-the-counter as needed, stay very well-hydrated and get plenty of rest.  I hope that you feel better soon, please let us know if there is anything else we can do for you  Kennieth Rad, PA-C Physician Assistant [facility] [email or web address]    Otitis Media, Adult  Otitis media occurs when there is inflammation and fluid in the middle ear space with signs and symptoms of an acute infection. The middle ear is a part of the ear that contains bones for hearing as well as air that helps send sounds to the brain. When infected fluid builds up in this space, it causes pressure and results in symptoms of acute otitis media. The eustachian tube connects the middle ear to the back of the nose (nasopharynx) and normally allows air into the middle ear space. If the eustachian tube becomes blocked, fluid can build up and become infected. What are the causes? This condition is caused by a blockage in the eustachian tube. This can be caused by an object like mucus, or by swelling (edema) of the tube. Problems that can cause a blockage include:  A cold or other upper respiratory infection.  Allergies.  An irritant, such as tobacco smoke.  Enlarged adenoids. The adenoids are areas of soft tissue located high in the back of the throat, behind the nose and the roof of the mouth. They are part of the body's defense system (immune system).  A mass in the nasopharynx.  Damage to the ear caused by pressure changes (barotrauma). What are the signs or symptoms? Symptoms of this condition include:  Ear pain.  Fever.  Decreased hearing.  Tiredness (lethargy).  Fluid leaking from the ear, if the eardrum is  ruptured or has burst.  Ringing in the ear. How is this diagnosed? This condition is diagnosed with a physical exam. During the exam, your health care provider will use an instrument called an otoscope to look in your ear and check for redness, swelling, and fluid. He or she will also ask about your symptoms. Your health care provider may also order tests, such as:  A pneumatic otoscopy. This is a test to check the movement of the eardrum. It is done by squeezing a small amount of air into the ear.  A tympanogram is a test that shows how well the eardrum moves in response to air pressure in the ear canal. It provides a graph for your health care provider to review.   How is this treated? This condition can go away on its own within 3-5 days. But if the condition is caused by a bacterial infection and does not go away on its own, or if it keeps coming back, your health care provider may:  Prescribe antibiotic medicine to treat the infection.  Prescribe or recommend medicines to control pain. Follow these instructions at home:  Take over-the-counter and prescription medicines only as told by your health care provider.  If you were prescribed an antibiotic medicine, take it as told by your health care provider. Do not stop taking the antibiotic even if you start to feel better.  Keep all follow-up visits as told by your health care provider. This is important. Contact a health care provider if:  You have bleeding from your nose.  There is a lump on your neck.  You are not feeling better in 5 days.  You feel worse instead of better. Get help right away if:  You have severe pain that is not controlled with medicine.  You have swelling, redness, or pain around your ear.  You have stiffness in your neck.  A part of your face is not moving (paralyzed).  The bone behind your ear (mastoid) is tender when you touch it.  You develop a severe headache. Summary  Otitis media is  redness, soreness, and swelling of the middle ear, usually resulting in pain.  This condition can go away on its own within 3-5 days.  If the problem does not go away in 3-5 days, your health care provider may prescribe or recommend medicines to treat the infection or your symptoms.  If you were prescribed an antibiotic medicine, take it as told by your health care provider.  Follow all instructions you were given by your health care provider. This information is not intended to replace advice given to you by your health care provider. Make sure you discuss any questions you have with your health care provider. Document Revised: 09/12/2019 Document Reviewed: 09/12/2019 Elsevier Patient Education  2021 [employer].

## 2021-04-12 ENCOUNTER — Ambulatory Visit: Payer: Self-pay | Admitting: Internal Medicine

## 2021-05-21 ENCOUNTER — Ambulatory Visit: Payer: Self-pay | Admitting: Internal Medicine

## 2021-06-18 ENCOUNTER — Other Ambulatory Visit: Payer: Self-pay

## 2021-06-18 ENCOUNTER — Encounter (HOSPITAL_COMMUNITY): Payer: Self-pay

## 2021-06-18 ENCOUNTER — Emergency Department (HOSPITAL_COMMUNITY)
Admission: EM | Admit: 2021-06-18 | Discharge: 2021-06-18 | Disposition: A | Discharge status: Home / Self Care | Payer: Self-pay | Attending: Emergency Medicine | Admitting: Emergency Medicine

## 2021-06-18 ENCOUNTER — Emergency Department (HOSPITAL_COMMUNITY): Payer: Self-pay

## 2021-06-18 DIAGNOSIS — F17210 Nicotine dependence, cigarettes, uncomplicated: Secondary | ICD-10-CM | POA: Insufficient documentation

## 2021-06-18 DIAGNOSIS — X58XXXA Exposure to other specified factors, initial encounter: Secondary | ICD-10-CM | POA: Insufficient documentation

## 2021-06-18 DIAGNOSIS — Z7951 Long term (current) use of inhaled steroids: Secondary | ICD-10-CM | POA: Insufficient documentation

## 2021-06-18 DIAGNOSIS — Z7982 Long term (current) use of aspirin: Secondary | ICD-10-CM | POA: Insufficient documentation

## 2021-06-18 DIAGNOSIS — R208 Other disturbances of skin sensation: Secondary | ICD-10-CM

## 2021-06-18 DIAGNOSIS — J45909 Unspecified asthma, uncomplicated: Secondary | ICD-10-CM | POA: Insufficient documentation

## 2021-06-18 DIAGNOSIS — S90852A Superficial foreign body, left foot, initial encounter: Secondary | ICD-10-CM | POA: Insufficient documentation

## 2021-06-18 NOTE — Discharge Instructions (Addendum)
At this time there does not appear to be the presence of an emergent medical condition, however there is always the potential for conditions to change. Please read and follow the below instructions.  Please return to the Emergency Department immediately for any new or worsening symptoms . Please be sure to follow up with your Primary Care Provider within one week regarding your visit today; please call their office to schedule an appointment even if you are feeling better for a follow-up visit. Please call the foot specialist Dr. Ellard Artis for further evaluation treatment of your foot pain.  You may use crutches to help keep weight off of your foot if it is helpful.  If you develop any signs of infection including Redness, swelling, increased pain, drainage or fever please return immediately to the ER.  Go to the nearest Emergency Department immediately if: You have fever or chills Your foot is numb or tingling. Your foot or toes are swollen. Your foot or toes turn white or blue. You have warmth and redness along your foot. You have any new/concerning or worsening of symptoms   Please read the additional information packets attached to your discharge summary.  Do not take your medicine if  develop an itchy rash, swelling in your mouth or lips, or difficulty breathing; call 911 and seek immediate emergency medical attention if this occurs.  You may review your lab tests and imaging results in their entirety on your MyChart account.  Please discuss all results of fully with your primary care provider and other specialist at your follow-up visit.  Note: Portions of this text may have been transcribed using voice recognition software. Every effort was made to ensure accuracy; however, inadvertent computerized transcription errors may still be present.

## 2021-06-18 NOTE — ED Triage Notes (Signed)
Pt presents with a possible splinter in the side of her left foot x4 days.

## 2021-06-18 NOTE — ED Provider Notes (Signed)
[place] DEPT Provider Note   CSN: [id number] Arrival date & time: 06/18/21  [id number]     History Chief Complaint  Patient presents with   Foreign Body    [name] is a 60 y.o. female history includes GERD, hiatal hernia, osteoarthritis, diverticulosis.  Patient presents today for concern of a foreign body in her left lateral foot.  Patient reports she was walking 4 days ago when she believes she stepped on a splinter, she reports persistent left lateral foot pain since that time, sharp, worsens with ambulation, improves with rest, pain does not radiate.  Patient reports that her husband looked at the bottom of her foot and thought that he saw a splinter.  Denies numbness/ting, weakness, bleeding, color change or any additional concerns.  HPI     Past Medical History:  Diagnosis Date   Allergy    uses Albuterol inhaler during allergy season   Diverticulosis 2013   Colonoscopy   GERD (gastroesophageal reflux disease)    Hiatal hernia    CT abdomen in August 2013   Hx of colonic polyps 2013   Colonoscopy    Osteoarthritis    b/l hip, seen on CT abd in 05/2012    Patient Active Problem List   Diagnosis Date Noted   Asymptomatic varicose veins of bilateral lower extremities 07/29/2019   Chronic venous insufficiency 07/29/2019   Left hip pain 07/29/2019   Protein-calorie malnutrition, severe 01/13/2019   Dehydration 01/10/2019   Hypercalcemia 01/10/2019   Sinus bradycardia 01/10/2019   Difficulty swallowing 01/10/2019   LLQ abdominal pain 01/10/2019   Calculus of gallbladder without cholecystitis without obstruction 12/31/2018   History of nicotine dependence 12/31/2018   Hiccups 12/31/2018   Dysphagia 12/31/2018   History of asthma 12/31/2018   Rash and nonspecific skin eruption 05/01/2013   DJD (degenerative joint disease) of knee 11/19/2012   HLD (hyperlipidemia) 11/13/2012   Osteoarthritis of left knee 07/26/2012   GERD  (gastroesophageal reflux disease) 06/15/2012   Health care maintenance 06/15/2012    Past Surgical History:  Procedure Laterality Date   BIOPSY  01/12/2019   Procedure: BIOPSY;  Surgeon: Jackquline Denmark, MD;  Location: [facility] ENDOSCOPY;  Service: Endoscopy;;   ESOPHAGOGASTRODUODENOSCOPY (EGD) WITH PROPOFOL N/A 01/12/2019   Procedure: ESOPHAGOGASTRODUODENOSCOPY (EGD) WITH PROPOFOL;  Surgeon: Jackquline Denmark, MD;  Location: [facility] ENDOSCOPY;  Service: Endoscopy;  Laterality: N/A;   MALONEY DILATION  01/12/2019   Procedure: Venia Minks DILATION;  Surgeon: Jackquline Denmark, MD;  Location: [facility] ENDOSCOPY;  Service: Endoscopy;;   TONSILLECTOMY     childhood   TOTAL ABDOMINAL HYSTERECTOMY W/ BILATERAL SALPINGOOPHORECTOMY  2008   Fibroids, done by Dr. Clovia Cuff at [place]  3   Para  3   Term      Preterm      AB      Living         SAB      IAB      Ectopic      Multiple      Live Births              Family History  Problem Relation Age of Onset   Stroke Father        passed away at 54 yo of cerebal brain hemorrhage    Hypertension Mother    Hyperlipidemia Mother    Colon cancer Neg Hx    Stomach cancer Neg Hx  Colon polyps Neg Hx    Rectal cancer Neg Hx     Social History   Tobacco Use   Smoking status: Every Day    Packs/day: 0.50    Years: 31.00    Pack years: 15.50    Types: Cigarettes   Smokeless tobacco: Never  Vaping Use   Vaping Use: Never used  Substance Use Topics   Alcohol use: Not Currently    Alcohol/week: 2.0 standard drinks    Types: 2 Glasses of wine per week    Comment: 1-2 beers/week   Drug use: No    Home Medications Prior to Admission medications   Medication Sig Start Date End Date Taking? Authorizing Provider  albuterol (VENTOLIN HFA) 108 (90 Base) MCG/ACT inhaler INHALE 2 PUFFS INTO THE LUNGS EVERY 6 (SIX) HOURS AS NEEDED. FOR SHORTNESS OF BREATH 01/20/21 01/20/22  Argentina Donovan, PA-C   amoxicillin-clavulanate (AUGMENTIN) 875-125 MG tablet Take 1 tablet by mouth 2 (two) times daily. 02/18/21   Mayers, Cari S, PA-C  aspirin 81 MG chewable tablet Chew 81 mg by mouth. Pt taking "some" days    [provider]  cetirizine (ZYRTEC ALLERGY) 10 MG tablet Take 1 tablet (10 mg total) by mouth daily. 02/18/21   Mayers, Cari S, PA-C  famotidine (PEPCID) 20 MG tablet TAKE 1 TABLET BY MOUTH ONCE TO TWICE DAILY 01/20/21 01/20/22  Argentina Donovan, PA-C  fluticasone (FLONASE) 50 MCG/ACT nasal spray PLACE 2 SPRAYS INTO BOTH NOSTRILS DAILY. 01/20/21 01/20/22  Argentina Donovan, PA-C  hydrochlorothiazide (HYDRODIURIL) 25 MG tablet TAKE 1 TABLET (25 MG TOTAL) BY MOUTH DAILY. 01/20/21 01/20/22  Argentina Donovan, PA-C  meloxicam (MOBIC) 15 MG tablet TAKE 1 TABLET (15 MG TOTAL) BY MOUTH DAILY. AS NEEDED FOR PAIN; TAKE AFTER EATING 01/20/21 01/20/22  Argentina Donovan, PA-C  Multiple Vitamin (MULTIVITAMIN WITH MINERALS) TABS tablet Take 1 tablet by mouth daily.    [provider]  pantoprazole (PROTONIX) 40 MG tablet TAKE ONE TABLET BY MOUTH ONCE TO TWICE DAILY 01/20/21 01/20/22  Argentina Donovan, PA-C  polyethylene glycol (MIRALAX / GLYCOLAX) 17 g packet Take 17 g by mouth 2 (two) times daily as needed. 02/07/19   Armbruster, Carlota Raspberry, MD    Allergies    Patient has no known allergies.  Review of Systems   Review of Systems  Constitutional: Negative.  Negative for chills and fever.  Musculoskeletal:  Positive for arthralgias.  Skin:  Negative for wound.  Neurological: Negative.  Negative for weakness and numbness.    Physical Exam Updated Vital Signs BP (!) 143/85 (BP Location: Left Arm)   Pulse 80   Temp 98.2 F (36.8 C) (Oral)   Resp 16   Ht '5\' 10"'$  (1.778 m)   Wt 113.4 kg   SpO2 97%   BMI 35.87 kg/m   Physical Exam Constitutional:      General: She is not in acute distress.    Appearance: Normal appearance. She is well-developed. She is not ill-appearing or diaphoretic.   HENT:     Head: Normocephalic and atraumatic.  Eyes:     General: Vision grossly intact. Gaze aligned appropriately.     Pupils: Pupils are equal, round, and reactive to light.  Neck:     Trachea: Trachea and phonation normal.  Pulmonary:     Effort: Pulmonary effort is normal. No respiratory distress.  Abdominal:     General: There is no distension.     Palpations: Abdomen is soft.  Tenderness: There is no abdominal tenderness. There is no guarding or rebound.  Musculoskeletal:        General: Normal range of motion.     Cervical back: Normal range of motion.       Feet:  Feet:     Comments: Capillary refill and sensation intact to all toes.  Strong pedal pulses.  Compartments soft.  Patient diffusely tender along the lateral aspect of the foot, patient cannot identify discrete area of pain.  No pain of the toes, arch, midfoot or calcaneus.  No TTP of the ankle.  Range of motion and strength intact with all movements of the foot and ankle.  No skin break, swelling or color change Skin:    General: Skin is warm and dry.  Neurological:     Mental Status: She is alert.     GCS: GCS eye subscore is 4. GCS verbal subscore is 5. GCS motor subscore is 6.     Comments: Speech is clear and goal oriented, follows commands Major Cranial nerves without deficit, no facial droop Moves extremities without ataxia, coordination intact  Psychiatric:        Behavior: Behavior normal.    ED Results / Procedures / Treatments   Labs (all labs ordered are listed, but only abnormal results are displayed) Labs Reviewed - No data to display  EKG None  Radiology DG Foot Complete Left  Result Date: 06/18/2021 CLINICAL DATA:  Sudden onset of pain at fifth metatarsal head, no known injury EXAM: LEFT FOOT - COMPLETE 3+ VIEW COMPARISON:  None FINDINGS: Osseous demineralization. Joint spaces preserved. Soft tissue swelling lateral to the fifth MTP joint. No acute fracture, dislocation, or bone  destruction. Small plantar calcaneal spur. IMPRESSION: No acute osseous abnormalities. Soft tissue swelling lateral to the fifth MTP joint. Electronically Signed   By: Lavonia Dana M.D.   On: 06/18/2021 11:09    Procedures Procedures   Medications Ordered in ED Medications - No data to display  ED Course  I have reviewed the triage vital signs and the nursing notes.  Pertinent labs & imaging results that were available during my care of the patient were reviewed by me and considered in my medical decision making (see chart for details).    MDM Rules/Calculators/A&P                           Additional history obtained from: Nursing notes from this visit. Patient's husband at bedside. -------------- 60 year old female presented with concern of foreign body of the left lateral foot.  On examination I cannot identify any skin break or evidence of foreign body.  She is neurovascular intact.  No evidence for cellulitis, septic arthritis, DVT, compartment syndrome, neurovascular compromise, fracture/dislocation or any additional concerns.  X-rays were obtained without evidence of foreign body or acute osseous findings.  Radiologist did comment on some lateral soft tissue swelling at the fifth MTP joint however patient's pain is more proximal than this.  Patient's husband in the room also cannot identify any discrete area where the patient may have a foreign body.  Do not feel that patient would benefit from exploration in the emergency department today.  Plan of care will be crutches, weight-bear as tolerated, follow-up with podiatry.  Patient was referred to Dr. Amalia Hailey for further evaluation and treatment.  I encouraged the patient to call their office today to schedule follow-up appointment.  Patient is to return to the emergency department for  any new or worsening symptoms.   At this time there does not appear to be any evidence of an acute emergency medical condition and the patient appears  stable for discharge with appropriate outpatient follow up. Diagnosis was discussed with patient who verbalizes understanding of care plan and is agreeable to discharge. I have discussed return precautions with patient and husband who verbalizes understanding. Patient encouraged to follow-up with their PCP and podiatry. All questions answered.  Patient's case discussed with Dr. Tyrone Nine who agrees with plan to discharge with follow-up.   Note: Portions of this report may have been transcribed using voice recognition software. Every effort was made to ensure accuracy; however, inadvertent computerized transcription errors may still be present.  Final Clinical Impression(s) / ED Diagnoses Final diagnoses:  Sensation of foreign body in foot    Rx / DC Orders ED Discharge Orders     None        Gari Crown 06/18/21 [place], DO 06/18/21 1438

## 2021-07-12 ENCOUNTER — Other Ambulatory Visit: Payer: Self-pay

## 2021-07-12 MED FILL — Pantoprazole Sodium EC Tab 40 MG (Base Equiv): ORAL | 30 days supply | Qty: 60 | Fill #0 | Status: AC

## 2021-07-12 MED FILL — Famotidine Tab 20 MG: ORAL | 30 days supply | Qty: 60 | Fill #0 | Status: AC

## 2021-07-12 MED FILL — Meloxicam Tab 15 MG: ORAL | 30 days supply | Qty: 30 | Fill #0 | Status: AC

## 2021-07-12 MED FILL — Fluticasone Propionate Nasal Susp 50 MCG/ACT: NASAL | 30 days supply | Qty: 16 | Fill #1 | Status: AC

## 2021-07-12 MED FILL — Hydrochlorothiazide Tab 25 MG: ORAL | 30 days supply | Qty: 30 | Fill #0 | Status: AC

## 2021-07-30 ENCOUNTER — Ambulatory Visit: Payer: Self-pay | Admitting: *Deleted

## 2021-07-30 NOTE — Telephone Encounter (Signed)
Patient has been feeling dizzy when she wakes in the morning Reason for Disposition  Ear congestion present > 48 hours  Answer Assessment - Initial Assessment Questions 1. LOCATION: "Which ear is involved?"       Both ears 2. SENSATION: "Describe how the ear feels." (e.g. stuffy, full, plugged)."      Feels like she has fluids in ears 3. ONSET:  "When did the ear symptoms start?"       4 days 4. PAIN: "Do you also have an earache?" If Yes, ask: "How bad is it?" (Scale 1-10; or mild, moderate, severe)     Slight pain in R ear 5. CAUSE: "What do you think is causing the ear congestion?"     Congestion- sinus 6. URI: "Do you have a runny nose or cough?"      Nasal drainage 7. NASAL ALLERGIES: "Are there symptoms of hay fever, such as sneezing or a clear nasal discharge?"     Seasonal allergy- sneezing, mucus in nose- clear 8. PREGNANCY: "Is there any chance you are pregnant?" "When was your last menstrual period?"     N/a  Protocols used: Ear - Congestion-A-AH

## 2021-07-30 NOTE — Telephone Encounter (Signed)
Patient is calling to report she has ear congestion, nasal drainage, woozy-off balance feeling. Patient states he sinus medication is not helping. Patient advised no appointment in the office- UC for evaluation of symptoms

## 2021-07-31 ENCOUNTER — Ambulatory Visit
Admission: EM | Admit: 2021-07-31 | Discharge: 2021-07-31 | Disposition: A | Discharge status: Home / Self Care | Payer: Medicare Other | Attending: Internal Medicine | Admitting: Internal Medicine

## 2021-07-31 ENCOUNTER — Other Ambulatory Visit: Payer: Self-pay

## 2021-07-31 ENCOUNTER — Encounter: Payer: Self-pay | Admitting: Emergency Medicine

## 2021-07-31 DIAGNOSIS — H65191 Other acute nonsuppurative otitis media, right ear: Secondary | ICD-10-CM | POA: Diagnosis not present

## 2021-07-31 DIAGNOSIS — Z20822 Contact with and (suspected) exposure to covid-19: Secondary | ICD-10-CM

## 2021-07-31 DIAGNOSIS — J069 Acute upper respiratory infection, unspecified: Secondary | ICD-10-CM | POA: Diagnosis not present

## 2021-07-31 MED ORDER — AMOXICILLIN 875 MG PO TABS: 875.0000 mg | ORAL_TABLET | Freq: Two times a day (BID) | ORAL | 0 refills | Status: AC

## 2021-07-31 NOTE — ED Provider Notes (Signed)
[facility]    CSN: [id number] Arrival date & time: 07/31/21  4944      History   Chief Complaint Chief Complaint  Patient presents with   Dizziness    HPI [name] is a 60 y.o. female.   Patient presents with dizziness, discomfort in bilateral ears, headache, nasal drainage that has been present for 4 days.  Patient has been taking Sudafed with minimal improvement in symptoms.  Denies chest pain or shortness of breath.  Denies any known fevers or sick contacts. Denies any cough.    Dizziness  Past Medical History:  Diagnosis Date   Allergy    uses Albuterol inhaler during allergy season   Diverticulosis 2013   Colonoscopy   GERD (gastroesophageal reflux disease)    Hiatal hernia    CT abdomen in August 2013   Hx of colonic polyps 2013   Colonoscopy    Osteoarthritis    b/l hip, seen on CT abd in 05/2012    Patient Active Problem List   Diagnosis Date Noted   Asymptomatic varicose veins of bilateral lower extremities 07/29/2019   Chronic venous insufficiency 07/29/2019   Left hip pain 07/29/2019   Protein-calorie malnutrition, severe 01/13/2019   Dehydration 01/10/2019   Hypercalcemia 01/10/2019   Sinus bradycardia 01/10/2019   Difficulty swallowing 01/10/2019   LLQ abdominal pain 01/10/2019   Calculus of gallbladder without cholecystitis without obstruction 12/31/2018   History of nicotine dependence 12/31/2018   Hiccups 12/31/2018   Dysphagia 12/31/2018   History of asthma 12/31/2018   Rash and nonspecific skin eruption 05/01/2013   DJD (degenerative joint disease) of knee 11/19/2012   HLD (hyperlipidemia) 11/13/2012   Osteoarthritis of left knee 07/26/2012   GERD (gastroesophageal reflux disease) 06/15/2012   Health care maintenance 06/15/2012    Past Surgical History:  Procedure Laterality Date   BIOPSY  01/12/2019   Procedure: BIOPSY;  Surgeon: Jackquline Denmark, MD;  Location: [facility] ENDOSCOPY;  Service: Endoscopy;;    ESOPHAGOGASTRODUODENOSCOPY (EGD) WITH PROPOFOL N/A 01/12/2019   Procedure: ESOPHAGOGASTRODUODENOSCOPY (EGD) WITH PROPOFOL;  Surgeon: Jackquline Denmark, MD;  Location[facility] ENDOSCOPY;  Service: Endoscopy;  Laterality: N/A;   MALONEY DILATION  01/12/2019   Procedure: Venia Minks DILATION;  Surgeon: Jackquline Denmark, MD;  Location: [facility] ENDOSCOPY;  Service: Endoscopy;;   TONSILLECTOMY     childhood   TOTAL ABDOMINAL HYSTERECTOMY W/ BILATERAL SALPINGOOPHORECTOMY  2008   Fibroids, done by Dr. Clovia Cuff at [facility]    OB History     Gravida  3   Para  3   Term      Preterm      AB      Living         SAB      IAB      Ectopic      Multiple      Live Births               Home Medications    Prior to Admission medications   Medication Sig Start Date End Date Taking? Authorizing Provider  albuterol (VENTOLIN HFA) 108 (90 Base) MCG/ACT inhaler INHALE 2 PUFFS INTO THE LUNGS EVERY 6 (SIX) HOURS AS NEEDED. FOR SHORTNESS OF BREATH 01/20/21 01/20/22 Yes McClung, Dionne Bucy, PA-C  amoxicillin (AMOXIL) 875 MG tablet Take 1 tablet (875 mg total) by mouth 2 (two) times daily for 7 days. 07/31/21 08/07/21 Yes Odis Luster, FNP  aspirin 81 MG chewable tablet Chew 81 mg by mouth. Pt taking "  some" days   Yes [provider]  cetirizine (ZYRTEC ALLERGY) 10 MG tablet Take 1 tablet (10 mg total) by mouth daily. 02/18/21  Yes Mayers, Cari S, PA-C  famotidine (PEPCID) 20 MG tablet TAKE 1 TABLET BY MOUTH ONCE TO TWICE DAILY 01/20/21 01/20/22 Yes McClung, Angela M, PA-C  fluticasone (FLONASE) 50 MCG/ACT nasal spray PLACE 2 SPRAYS INTO BOTH NOSTRILS DAILY. 01/20/21 01/20/22 Yes McClung, Dionne Bucy, PA-C  hydrochlorothiazide (HYDRODIURIL) 25 MG tablet TAKE 1 TABLET (25 MG TOTAL) BY MOUTH DAILY. 01/20/21 01/20/22 Yes McClung, Dionne Bucy, PA-C  meloxicam (MOBIC) 15 MG tablet TAKE 1 TABLET (15 MG TOTAL) BY MOUTH DAILY. AS NEEDED FOR PAIN; TAKE AFTER EATING 01/20/21 01/20/22 Yes McClung, Dionne Bucy, PA-C  Multiple  Vitamin (MULTIVITAMIN WITH MINERALS) TABS tablet Take 1 tablet by mouth daily.   Yes [provider]  pantoprazole (PROTONIX) 40 MG tablet TAKE ONE TABLET BY MOUTH ONCE TO TWICE DAILY 01/20/21 01/20/22 Yes McClung, Angela M, PA-C  polyethylene glycol (MIRALAX / GLYCOLAX) 17 g packet Take 17 g by mouth 2 (two) times daily as needed. 02/07/19  Yes Armbruster, Carlota Raspberry, MD    Family History Family History  Problem Relation Age of Onset   Stroke Father        passed away at 28 yo of cerebal brain hemorrhage    Hypertension Mother    Hyperlipidemia Mother    Colon cancer Neg Hx    Stomach cancer Neg Hx    Colon polyps Neg Hx    Rectal cancer Neg Hx     Social History Social History   Tobacco Use   Smoking status: Every Day    Packs/day: 0.50    Years: 31.00    Pack years: 15.50    Types: Cigarettes   Smokeless tobacco: Never  Vaping Use   Vaping Use: Never used  Substance Use Topics   Alcohol use: Not Currently    Alcohol/week: 2.0 standard drinks    Types: 2 Glasses of wine per week    Comment: 1-2 beers/week   Drug use: No     Allergies   Patient has no known allergies.   Review of Systems Review of Systems Per HPI  Physical Exam Triage Vital Signs ED Triage Vitals  Enc Vitals Group     BP 07/31/21 0927 130/82     Pulse Rate 07/31/21 0927 75     Resp 07/31/21 0927 18     Temp 07/31/21 0927 98.2 F (36.8 C)     Temp Source 07/31/21 0927 Oral     SpO2 07/31/21 0927 95 %     Weight 07/31/21 0929 250 lb (113.4 kg)     Height 07/31/21 0929 5' 10.5" (1.791 m)     Head Circumference --      Peak Flow --      Pain Score 07/31/21 0928 8     Pain Loc --      Pain Edu? --      Excl. in [place]? --    No data found.  Updated Vital Signs BP 130/82 (BP Location: Left Arm)   Pulse 75   Temp 98.2 F (36.8 C) (Oral)   Resp 18   Ht 5' 10.5" (1.791 m)   Wt 250 lb (113.4 kg)   SpO2 95%   BMI 35.36 kg/m   Visual Acuity Right Eye Distance:   Left Eye  Distance:   Bilateral Distance:    Right Eye Near:   Left Eye Near:  Bilateral Near:     Physical Exam Constitutional:      General: She is not in acute distress.    Appearance: Normal appearance. She is not toxic-appearing or diaphoretic.  HENT:     Head: Normocephalic and atraumatic.     Right Ear: Ear canal normal. Tympanic membrane is erythematous. Tympanic membrane is not bulging.     Left Ear: Ear canal normal. A middle ear effusion is present. Tympanic membrane is not erythematous or bulging.     Nose: Congestion present.     Mouth/Throat:     Mouth: Mucous membranes are moist.     Pharynx: No posterior oropharyngeal erythema.  Eyes:     Extraocular Movements: Extraocular movements intact.     Conjunctiva/sclera: Conjunctivae normal.     Pupils: Pupils are equal, round, and reactive to light.  Cardiovascular:     Rate and Rhythm: Normal rate and regular rhythm.     Pulses: Normal pulses.     Heart sounds: Normal heart sounds.  Pulmonary:     Effort: Pulmonary effort is normal. No respiratory distress.     Breath sounds: Normal breath sounds. No wheezing.  Abdominal:     General: Abdomen is flat. Bowel sounds are normal.     Palpations: Abdomen is soft.  Musculoskeletal:        General: Normal range of motion.     Cervical back: Normal range of motion.  Skin:    General: Skin is warm and dry.  Neurological:     General: No focal deficit present.     Mental Status: She is alert and oriented to person, place, and time. Mental status is at baseline.  Psychiatric:        Mood and Affect: Mood normal.        Behavior: Behavior normal.     UC Treatments / Results  Labs (all labs ordered are listed, but only abnormal results are displayed) Labs Reviewed  NOVEL CORONAVIRUS, NAA    EKG   Radiology No results found.  Procedures Procedures (including critical care time)  Medications Ordered in UC Medications - No data to display  Initial Impression /  Assessment and Plan / UC Course  I have reviewed the triage vital signs and the nursing notes.  Pertinent labs & imaging results that were available during my care of the patient were reviewed by me and considered in my medical decision making (see chart for details).     Will treat right otitis media with amoxicillin.  Patient to continue cetirizine and Flonase to help alleviate middle ear effusion in left ear.  COVID-19 PCR is pending.  Suspect dizziness is related to ear infection and middle ear effusion.  discussed over-the-counter medications to help alleviate patient symptoms as well.Discussed strict return precautions. Patient verbalized understanding and is agreeable with plan.  Final Clinical Impressions(s) / UC Diagnoses   Final diagnoses:  Other non-recurrent acute nonsuppurative otitis media of right ear  Acute upper respiratory infection  Encounter for laboratory testing for COVID-19 virus     Discharge Instructions      You have been prescribed amoxicillin antibiotic to treat right ear infection.  COVID-19 test is pending.  We will call if it is positive.     ED Prescriptions     Medication Sig Dispense Auth. Provider   amoxicillin (AMOXIL) 875 MG tablet Take 1 tablet (875 mg total) by mouth 2 (two) times daily for 7 days. 14 tablet Odis Luster, FNP  PDMP not reviewed this encounter.   Odis Luster, [place] 07/31/21 [phone]

## 2021-07-31 NOTE — Discharge Instructions (Addendum)
You have been prescribed amoxicillin antibiotic to treat right ear infection.  COVID-19 test is pending.  We will call if it is positive.

## 2021-07-31 NOTE — ED Triage Notes (Signed)
Patient c/o dizziness, water in both ears, headache, no cough x 4 days.  Patient has been taken Sudafed w/o relief.  Patient is current on COVID vaccinations.

## 2021-08-01 LAB — NOVEL CORONAVIRUS, NAA: SARS-CoV-2, NAA: NOT DETECTED

## 2021-08-01 LAB — SARS-COV-2, NAA 2 DAY TAT

## 2021-09-24 ENCOUNTER — Ambulatory Visit
Admission: EM | Admit: 2021-09-24 | Discharge: 2021-09-24 | Disposition: A | Discharge status: Home / Self Care | Payer: Medicare Other | Attending: Internal Medicine | Admitting: Internal Medicine

## 2021-09-24 ENCOUNTER — Other Ambulatory Visit: Payer: Self-pay

## 2021-09-24 DIAGNOSIS — J069 Acute upper respiratory infection, unspecified: Secondary | ICD-10-CM

## 2021-09-24 DIAGNOSIS — H65193 Other acute nonsuppurative otitis media, bilateral: Secondary | ICD-10-CM

## 2021-09-24 MED ORDER — PREDNISONE 10 MG PO TABS: 20.0000 mg | ORAL_TABLET | Freq: Every day | ORAL | 0 refills | Status: DC

## 2021-09-24 MED ORDER — BENZONATATE 100 MG PO CAPS: 100.0000 mg | ORAL_CAPSULE | Freq: Three times a day (TID) | ORAL | 0 refills | Status: DC | PRN

## 2021-09-24 NOTE — ED Provider Notes (Signed)
[facility]    CSN: [id number] Arrival date & time: 09/24/21  1633      History   Chief Complaint Chief Complaint  Patient presents with   Cough   Diarrhea    HPI [name] is a 60 y.o. female.   Patient presents with 2-day history of bilateral ear discomfort, cough, nasal congestion, dizziness, sinus pressure.  Patient has taken TheraFlu with no improvement in symptoms.  Denies chest pain, shortness of breath, nausea, vomiting, abdominal pain but does endorse some diarrhea that started yesterday.  Denies any known fevers.  Patient's grandson has had similar symptoms recently.   Cough Diarrhea  Past Medical History:  Diagnosis Date   Allergy    uses Albuterol inhaler during allergy season   Diverticulosis 2013   Colonoscopy   GERD (gastroesophageal reflux disease)    Hiatal hernia    CT abdomen in August 2013   Hx of colonic polyps 2013   Colonoscopy    Osteoarthritis    b/l hip, seen on CT abd in 05/2012    Patient Active Problem List   Diagnosis Date Noted   Asymptomatic varicose veins of bilateral lower extremities 07/29/2019   Chronic venous insufficiency 07/29/2019   Left hip pain 07/29/2019   Protein-calorie malnutrition, severe 01/13/2019   Dehydration 01/10/2019   Hypercalcemia 01/10/2019   Sinus bradycardia 01/10/2019   Difficulty swallowing 01/10/2019   LLQ abdominal pain 01/10/2019   Calculus of gallbladder without cholecystitis without obstruction 12/31/2018   History of nicotine dependence 12/31/2018   Hiccups 12/31/2018   Dysphagia 12/31/2018   History of asthma 12/31/2018   Rash and nonspecific skin eruption 05/01/2013   DJD (degenerative joint disease) of knee 11/19/2012   HLD (hyperlipidemia) 11/13/2012   Osteoarthritis of left knee 07/26/2012   GERD (gastroesophageal reflux disease) 06/15/2012   Health care maintenance 06/15/2012    Past Surgical History:  Procedure Laterality Date   BIOPSY  01/12/2019   Procedure:  BIOPSY;  Surgeon: Jackquline Denmark, MD;  Location: [facility] ENDOSCOPY;  Service: Endoscopy;;   ESOPHAGOGASTRODUODENOSCOPY (EGD) WITH PROPOFOL N/A 01/12/2019   Procedure: ESOPHAGOGASTRODUODENOSCOPY (EGD) WITH PROPOFOL;  Surgeon: Jackquline Denmark, MD;  Location: [facility] ENDOSCOPY;  Service: Endoscopy;  Laterality: N/A;   MALONEY DILATION  01/12/2019   Procedure: Venia Minks DILATION;  Surgeon: Jackquline Denmark, MD;  Location: [facility] ENDOSCOPY;  Service: Endoscopy;;   TONSILLECTOMY     childhood   TOTAL ABDOMINAL HYSTERECTOMY W/ BILATERAL SALPINGOOPHORECTOMY  2008   Fibroids, done by Dr. Clovia Cuff at [facility]    OB History     Gravida  3   Para  3   Term      Preterm      AB      Living         SAB      IAB      Ectopic      Multiple      Live Births               Home Medications    Prior to Admission medications   Medication Sig Start Date End Date Taking? Authorizing Provider  benzonatate (TESSALON) 100 MG capsule Take 1 capsule (100 mg total) by mouth every 8 (eight) hours as needed for cough. 09/24/21  Yes , Hildred Alamin E, FNP  predniSONE (DELTASONE) 10 MG tablet Take 2 tablets (20 mg total) by mouth daily for 5 days. 09/24/21 09/29/21 Yes Teodora Medici, FNP  albuterol (VENTOLIN HFA) 108 (90 Base)  MCG/ACT inhaler INHALE 2 PUFFS INTO THE LUNGS EVERY 6 (SIX) HOURS AS NEEDED. FOR SHORTNESS OF BREATH 01/20/21 01/20/22  Argentina Donovan, PA-C  aspirin 81 MG chewable tablet Chew 81 mg by mouth. Pt taking "some" days    [provider]  cetirizine (ZYRTEC ALLERGY) 10 MG tablet Take 1 tablet (10 mg total) by mouth daily. 02/18/21   Mayers, Cari S, PA-C  famotidine (PEPCID) 20 MG tablet TAKE 1 TABLET BY MOUTH ONCE TO TWICE DAILY 01/20/21 01/20/22  Argentina Donovan, PA-C  fluticasone (FLONASE) 50 MCG/ACT nasal spray PLACE 2 SPRAYS INTO BOTH NOSTRILS DAILY. 01/20/21 01/20/22  Argentina Donovan, PA-C  hydrochlorothiazide (HYDRODIURIL) 25 MG tablet TAKE 1 TABLET (25 MG TOTAL) BY MOUTH DAILY.  01/20/21 01/20/22  Argentina Donovan, PA-C  meloxicam (MOBIC) 15 MG tablet TAKE 1 TABLET (15 MG TOTAL) BY MOUTH DAILY. AS NEEDED FOR PAIN; TAKE AFTER EATING 01/20/21 01/20/22  Argentina Donovan, PA-C  Multiple Vitamin (MULTIVITAMIN WITH MINERALS) TABS tablet Take 1 tablet by mouth daily.    [provider]  pantoprazole (PROTONIX) 40 MG tablet TAKE ONE TABLET BY MOUTH ONCE TO TWICE DAILY 01/20/21 01/20/22  Argentina Donovan, PA-C  polyethylene glycol (MIRALAX / GLYCOLAX) 17 g packet Take 17 g by mouth 2 (two) times daily as needed. 02/07/19   Armbruster, Carlota Raspberry, MD    Family History Family History  Problem Relation Age of Onset   Stroke Father        passed away at 21 yo of cerebal brain hemorrhage    Hypertension Mother    Hyperlipidemia Mother    Colon cancer Neg Hx    Stomach cancer Neg Hx    Colon polyps Neg Hx    Rectal cancer Neg Hx     Social History Social History   Tobacco Use   Smoking status: Every Day    Packs/day: 0.50    Years: 31.00    Pack years: 15.50    Types: Cigarettes   Smokeless tobacco: Never  Vaping Use   Vaping Use: Never used  Substance Use Topics   Alcohol use: Not Currently    Alcohol/week: 2.0 standard drinks    Types: 2 Glasses of wine per week    Comment: 1-2 beers/week   Drug use: No     Allergies   Patient has no known allergies.   Review of Systems Review of Systems Per HPI  Physical Exam Triage Vital Signs ED Triage Vitals  Enc Vitals Group     BP 09/24/21 1723 116/77     Pulse Rate 09/24/21 1723 77     Resp 09/24/21 1723 18     Temp 09/24/21 1723 98.1 F (36.7 C)     Temp Source 09/24/21 1723 Oral     SpO2 09/24/21 1723 94 %     Weight --      Height --      Head Circumference --      Peak Flow --      Pain Score 09/24/21 1725 0     Pain Loc --      Pain Edu? --      Excl. in [place]? --    No data found.  Updated Vital Signs BP 116/77 (BP Location: Left Arm)   Pulse 77   Temp 98.1 F (36.7 C) (Oral)   Resp  18   SpO2 94%   Visual Acuity Right Eye Distance:   Left Eye Distance:   Bilateral Distance:  Right Eye Near:   Left Eye Near:    Bilateral Near:     Physical Exam Constitutional:      General: She is not in acute distress.    Appearance: Normal appearance. She is not toxic-appearing or diaphoretic.  HENT:     Head: Normocephalic and atraumatic.     Right Ear: Ear canal normal. No drainage, swelling or tenderness. A middle ear effusion is present. No mastoid tenderness. Tympanic membrane is not perforated, erythematous or bulging.     Left Ear: Ear canal normal. No drainage, swelling or tenderness. A middle ear effusion is present. Tympanic membrane is not perforated, erythematous or bulging.     Nose: Congestion present.     Mouth/Throat:     Mouth: Mucous membranes are moist.     Pharynx: No posterior oropharyngeal erythema.  Eyes:     Extraocular Movements: Extraocular movements intact.     Conjunctiva/sclera: Conjunctivae normal.     Pupils: Pupils are equal, round, and reactive to light.  Cardiovascular:     Rate and Rhythm: Normal rate and regular rhythm.     Pulses: Normal pulses.     Heart sounds: Normal heart sounds.  Pulmonary:     Effort: Pulmonary effort is normal. No respiratory distress.     Breath sounds: Normal breath sounds. No stridor. No wheezing, rhonchi or rales.  Abdominal:     General: Abdomen is flat. Bowel sounds are normal.     Palpations: Abdomen is soft.  Musculoskeletal:        General: Normal range of motion.     Cervical back: Normal range of motion.  Skin:    General: Skin is warm and dry.  Neurological:     General: No focal deficit present.     Mental Status: She is alert and oriented to person, place, and time. Mental status is at baseline.  Psychiatric:        Mood and Affect: Mood normal.        Behavior: Behavior normal.     UC Treatments / Results  Labs (all labs ordered are listed, but only abnormal results are  displayed) Labs Reviewed  COVID-19, FLU A+B NAA    EKG   Radiology No results found.  Procedures Procedures (including critical care time)  Medications Ordered in UC Medications - No data to display  Initial Impression / Assessment and Plan / UC Course  I have reviewed the triage vital signs and the nursing notes.  Pertinent labs & imaging results that were available during my care of the patient were reviewed by me and considered in my medical decision making (see chart for details).     Patient presents with symptoms likely from a viral upper respiratory infection. Differential includes bacterial pneumonia, sinusitis, allergic rhinitis, Covid 19, flu. Do not suspect underlying cardiopulmonary process. Symptoms seem unlikely related to ACS, CHF or COPD exacerbations, pneumonia, pneumothorax. Patient is nontoxic appearing and not in need of emergent medical intervention.  COVID-19 and flu test pending.  Recommended symptom control with over the counter medications that are safe with patient's history of high blood pressure.  Suggested Coricidin HBP.  Will prescribe prednisone steroid to help alleviate middle ear effusion and ear discomfort as patient has not had success with cetirizine and Flonase.  Benzonatate to take as needed for cough.  Return if symptoms fail to improve in 1-2 weeks or you develop shortness of breath, chest pain, severe headache. Patient states understanding and is agreeable.  Discharged  with PCP followup.  Final Clinical Impressions(s) / UC Diagnoses   Final diagnoses:  Viral upper respiratory tract infection with cough  Acute MEE (middle ear effusion), bilateral     Discharge Instructions      It appears that you have a viral upper respiratory infection that should resolve in the next few days with symptomatic treatment.  You have been prescribed prednisone steroid to help alleviate fluid in the ears.  Please do not take meloxicam with this  medication.  You have also been prescribed a cough medication to take as needed.  COVID-19 and flu test is pending.    ED Prescriptions     Medication Sig Dispense Auth. Provider   benzonatate (TESSALON) 100 MG capsule Take 1 capsule (100 mg total) by mouth every 8 (eight) hours as needed for cough. 21 capsule [place], [place], [place]   predniSONE (DELTASONE) 10 MG tablet Take 2 tablets (20 mg total) by mouth daily for 5 days. 10 tablet Teodora Medici, [place]      PDMP not reviewed this encounter.   Teodora Medici, [place] 09/24/21 [phone]

## 2021-09-24 NOTE — Discharge Instructions (Signed)
It appears that you have a viral upper respiratory infection that should resolve in the next few days with symptomatic treatment.  You have been prescribed prednisone steroid to help alleviate fluid in the ears.  Please do not take meloxicam with this medication.  You have also been prescribed a cough medication to take as needed.  COVID-19 and flu test is pending.

## 2021-09-24 NOTE — ED Triage Notes (Signed)
Two day h/o bilateral ear clogging, cough, congestion, dizziness and sinus pressure. Has been taking theraflu w/o relief. Onset yesterday of diarrhea. No emesis.

## 2021-09-25 ENCOUNTER — Telehealth: Payer: Self-pay

## 2021-09-25 LAB — COVID-19, FLU A+B NAA
Influenza A, NAA: DETECTED — AB
Influenza B, NAA: NOT DETECTED
SARS-CoV-2, NAA: NOT DETECTED

## 2021-09-25 MED ORDER — BENZONATATE 100 MG PO CAPS: 100.0000 mg | ORAL_CAPSULE | Freq: Three times a day (TID) | ORAL | 0 refills | Status: DC | PRN

## 2021-09-25 MED ORDER — PREDNISONE 10 MG PO TABS: 20.0000 mg | ORAL_TABLET | Freq: Every day | ORAL | 0 refills | Status: AC

## 2021-09-25 NOTE — Telephone Encounter (Signed)
Pt returned to clinic stating that pharmacy her r was originally sent to yesterday is closed and requested they be sent to a new pharmacy. RN received verbal approval by NP on site. Re-ordered and sent to new pharmacy without complication. No further questions by pt.

## 2021-10-23 ENCOUNTER — Encounter (HOSPITAL_COMMUNITY): Payer: Self-pay | Admitting: Emergency Medicine

## 2021-10-23 ENCOUNTER — Other Ambulatory Visit: Payer: Self-pay

## 2021-10-23 ENCOUNTER — Emergency Department (HOSPITAL_COMMUNITY): Payer: Medicare Other

## 2021-10-23 ENCOUNTER — Emergency Department (HOSPITAL_COMMUNITY)
Admission: EM | Admit: 2021-10-23 | Discharge: 2021-10-23 | Disposition: A | Discharge status: Home / Self Care | Payer: Medicare Other | Attending: Emergency Medicine | Admitting: Emergency Medicine

## 2021-10-23 DIAGNOSIS — R509 Fever, unspecified: Secondary | ICD-10-CM | POA: Diagnosis not present

## 2021-10-23 DIAGNOSIS — M791 Myalgia, unspecified site: Secondary | ICD-10-CM | POA: Insufficient documentation

## 2021-10-23 DIAGNOSIS — Z7951 Long term (current) use of inhaled steroids: Secondary | ICD-10-CM | POA: Insufficient documentation

## 2021-10-23 DIAGNOSIS — Z20822 Contact with and (suspected) exposure to covid-19: Secondary | ICD-10-CM | POA: Insufficient documentation

## 2021-10-23 DIAGNOSIS — J069 Acute upper respiratory infection, unspecified: Secondary | ICD-10-CM

## 2021-10-23 DIAGNOSIS — Z7982 Long term (current) use of aspirin: Secondary | ICD-10-CM | POA: Insufficient documentation

## 2021-10-23 DIAGNOSIS — R0981 Nasal congestion: Secondary | ICD-10-CM | POA: Insufficient documentation

## 2021-10-23 DIAGNOSIS — H9201 Otalgia, right ear: Secondary | ICD-10-CM | POA: Insufficient documentation

## 2021-10-23 DIAGNOSIS — J45909 Unspecified asthma, uncomplicated: Secondary | ICD-10-CM | POA: Insufficient documentation

## 2021-10-23 DIAGNOSIS — F17210 Nicotine dependence, cigarettes, uncomplicated: Secondary | ICD-10-CM | POA: Diagnosis not present

## 2021-10-23 DIAGNOSIS — R0602 Shortness of breath: Secondary | ICD-10-CM | POA: Insufficient documentation

## 2021-10-23 DIAGNOSIS — R059 Cough, unspecified: Secondary | ICD-10-CM | POA: Diagnosis not present

## 2021-10-23 LAB — BASIC METABOLIC PANEL
Anion gap: 7 (ref 5–15)
BUN: 12 mg/dL (ref 6–20)
CO2: 25 mmol/L (ref 22–32)
Calcium: 9.1 mg/dL (ref 8.9–10.3)
Chloride: 107 mmol/L (ref 98–111)
Creatinine, Ser: 0.61 mg/dL (ref 0.44–1.00)
GFR, Estimated: >60 mL/min (ref >60)
Glucose, Bld: 108 mg/dL — ABNORMAL HIGH (ref 70–99)
Potassium: 3.9 mmol/L (ref 3.5–5.1)
Sodium: 139 mmol/L (ref 135–145)

## 2021-10-23 LAB — CBC WITH DIFFERENTIAL/PLATELET
Abs Immature Granulocytes: 0.07 10*3/uL (ref 0.00–0.07)
Basophils Absolute: 0.1 10*3/uL (ref 0.0–0.1)
Basophils Relative: 1 %
Eosinophils Absolute: 0.3 10*3/uL (ref 0.0–0.5)
Eosinophils Relative: 3 %
HCT: 43.1 % (ref 36.0–46.0)
Hemoglobin: 13.9 g/dL (ref 12.0–15.0)
Immature Granulocytes: 1 %
Lymphocytes Relative: 25 %
Lymphs Abs: 2.1 10*3/uL (ref 0.7–4.0)
MCH: 30 pg (ref 26.0–34.0)
MCHC: 32.3 g/dL (ref 30.0–36.0)
MCV: 92.9 fL (ref 80.0–100.0)
Monocytes Absolute: 0.5 10*3/uL (ref 0.1–1.0)
Monocytes Relative: 6 %
Neutro Abs: 5.3 10*3/uL (ref 1.7–7.7)
Neutrophils Relative %: 64 %
Platelets: 238 10*3/uL (ref 150–400)
RBC: 4.64 MIL/uL (ref 3.87–5.11)
RDW: 14 % (ref 11.5–15.5)
WBC: 8.2 10*3/uL (ref 4.0–10.5)
nRBC: 0 % (ref 0.0–0.2)

## 2021-10-23 LAB — RESP PANEL BY RT-PCR (FLU A&B, COVID) ARPGX2
Influenza A by PCR: NEGATIVE
Influenza B by PCR: NEGATIVE
SARS Coronavirus 2 by RT PCR: NEGATIVE

## 2021-10-23 MED ORDER — ALBUTEROL SULFATE HFA 108 (90 BASE) MCG/ACT IN AERS
2.0000 | INHALATION_SPRAY | Freq: Once | RESPIRATORY_TRACT | Status: AC
  Administered 2021-10-23: 2 via RESPIRATORY_TRACT
  Filled 2021-10-23: qty 6.7

## 2021-10-23 MED ORDER — BENZONATATE 100 MG PO CAPS: 100.0000 mg | ORAL_CAPSULE | Freq: Three times a day (TID) | ORAL | 0 refills | Status: DC

## 2021-10-23 MED ORDER — PREDNISONE 10 MG PO TABS: 20.0000 mg | ORAL_TABLET | Freq: Every day | ORAL | 0 refills | Status: DC

## 2021-10-23 NOTE — ED Provider Notes (Deleted)
Emergency Medicine Provider Triage Evaluation Note  [name] , a 60 y.o. female  was evaluated in triage.  Pt complains of right ear pain, or tingling to the right side of her face and feeling like "something is coming out of her ear when she tilts her head to the left side".  She has asthma, ran out of inhaler 3 days ago.  Feeling more short of breath at baseline, coughing up white sputum.  Review of Systems  Positive: SOB, COUGH, EAR PAIN Negative: CP  Physical Exam  BP (!) 152/91 (BP Location: Right Arm)    Pulse 81    Temp 98.4 F (36.9 C) (Oral)    Resp 18    Ht 5\' 10"  (1.778 m)    Wt 113.4 kg    SpO2 94%    BMI 35.87 kg/m  Gen:   Awake, no distress   Resp:  Normal effort.  No expiratory inspiratory wheezes. MSK:   Moves extremities without difficulty  Other:  TM clear bilaterally.  Medical Decision Making  Medically screening exam initiated at 6:41 AM.  Appropriate orders placed.  [name] was informed that the remainder of the evaluation will be completed by another provider, this initial triage assessment does not replace that evaluation, and the importance of remaining in the ED until their evaluation is complete.  Patient does not have oxygen at home, satting in 90 to 92% on room air.  We will check basic labs and get a chest x-ray.  COVID swab ordered.   Sherrill Raring, PA-C 10/23/21 [id number]    Lacretia Leigh, MD 10/23/21 [phone]

## 2021-10-23 NOTE — Discharge Instructions (Addendum)
Your work-up today was reassuring.  The x-ray did not show any pneumonia, you are negative for flu and COVID.  I suspect you have a different virus, please use your inhaler as needed should have been refilled today in the ER.  I will prescribe you cough medicine he can take every 8 hours as needed.  Additionally can take 40 mg of steroid daily for the next 5 days such to help with her symptoms.

## 2021-10-23 NOTE — ED Triage Notes (Signed)
Patient arrives complaining of a recurrent URI and ear infection. Patient states symptoms have been present about 4 days. Patient complaining of difficulty hearing out of the right ear and a small cough with intermittent white sputum. Denies fevers, nausea and vomiting. Patient also complaining of muscle spasms after coughing.

## 2021-10-23 NOTE — ED Provider Notes (Signed)
[facility] DEPT Provider Note   CSN: [id number] Arrival date & time: 10/23/21  0600     History Chief Complaint  Patient presents with   Cough   Ear Pain    [name] is a 60 y.o. female.   Cough Associated symptoms: ear pain and shortness of breath   Associated symptoms: no chest pain    This is a 60 year old female presenting with right ear pain/tingling to the right side of her face.  Feels like "something is falling out of my ear".  She also reports having URI symptoms that started about 4 days ago.  Symptoms include nasal congestion, cough, fevers and myalgias.  She does have a history of asthma, she been out of her inhaler 3 days ago and does report feeling short of breath.  Denies any auditory wheezes, cough is intermittent and occasionally productive with white sputum.  Past Medical History:  Diagnosis Date   Allergy    uses Albuterol inhaler during allergy season   Diverticulosis 2013   Colonoscopy   GERD (gastroesophageal reflux disease)    Hiatal hernia    CT abdomen in August 2013   Hx of colonic polyps 2013   Colonoscopy    Osteoarthritis    b/l hip, seen on CT abd in 05/2012    Patient Active Problem List   Diagnosis Date Noted   Asymptomatic varicose veins of bilateral lower extremities 07/29/2019   Chronic venous insufficiency 07/29/2019   Left hip pain 07/29/2019   Protein-calorie malnutrition, severe 01/13/2019   Dehydration 01/10/2019   Hypercalcemia 01/10/2019   Sinus bradycardia 01/10/2019   Difficulty swallowing 01/10/2019   LLQ abdominal pain 01/10/2019   Calculus of gallbladder without cholecystitis without obstruction 12/31/2018   History of nicotine dependence 12/31/2018   Hiccups 12/31/2018   Dysphagia 12/31/2018   History of asthma 12/31/2018   Rash and nonspecific skin eruption 05/01/2013   DJD (degenerative joint disease) of knee 11/19/2012   HLD (hyperlipidemia) 11/13/2012   Osteoarthritis of  left knee 07/26/2012   GERD (gastroesophageal reflux disease) 06/15/2012   Health care maintenance 06/15/2012    Past Surgical History:  Procedure Laterality Date   BIOPSY  01/12/2019   Procedure: BIOPSY;  Surgeon: Jackquline Denmark, MD;  Location: [facility] ENDOSCOPY;  Service: Endoscopy;;   ESOPHAGOGASTRODUODENOSCOPY (EGD) WITH PROPOFOL N/A 01/12/2019   Procedure: ESOPHAGOGASTRODUODENOSCOPY (EGD) WITH PROPOFOL;  Surgeon: Jackquline Denmark, MD;  Location: [facility] ENDOSCOPY;  Service: Endoscopy;  Laterality: N/A;   MALONEY DILATION  01/12/2019   Procedure: Venia Minks DILATION;  Surgeon: Jackquline Denmark, MD;  Location: [facility] ENDOSCOPY;  Service: Endoscopy;;   TONSILLECTOMY     childhood   TOTAL ABDOMINAL HYSTERECTOMY W/ BILATERAL SALPINGOOPHORECTOMY  2008   Fibroids, done by Dr. Clovia Cuff at [facility]  3   Para  3   Term      Preterm      AB      Living         SAB      IAB      Ectopic      Multiple      Live Births              Family History  Problem Relation Age of Onset   Stroke Father        passed away at 60 yo of cerebal brain hemorrhage    Hypertension Mother    Hyperlipidemia Mother  Colon cancer Neg Hx    Stomach cancer Neg Hx    Colon polyps Neg Hx    Rectal cancer Neg Hx     Social History   Tobacco Use   Smoking status: Every Day    Packs/day: 0.50    Years: 31.00    Pack years: 15.50    Types: Cigarettes   Smokeless tobacco: Never  Vaping Use   Vaping Use: Never used  Substance Use Topics   Alcohol use: Not Currently    Alcohol/week: 2.0 standard drinks    Types: 2 Glasses of wine per week    Comment: 1-2 beers/week   Drug use: No    Home Medications Prior to Admission medications   Medication Sig Start Date End Date Taking? Authorizing Provider  albuterol (VENTOLIN HFA) 108 (90 Base) MCG/ACT inhaler INHALE 2 PUFFS INTO THE LUNGS EVERY 6 (SIX) HOURS AS NEEDED. FOR SHORTNESS OF BREATH 01/20/21 01/20/22  Argentina Donovan, PA-C  aspirin 81 MG chewable tablet Chew 81 mg by mouth. Pt taking "some" days    [provider]  benzonatate (TESSALON) 100 MG capsule Take 1 capsule (100 mg total) by mouth every 8 (eight) hours as needed for cough. 09/25/21   Teodora Medici, FNP  cetirizine (ZYRTEC ALLERGY) 10 MG tablet Take 1 tablet (10 mg total) by mouth daily. 02/18/21   Mayers, Cari S, PA-C  famotidine (PEPCID) 20 MG tablet TAKE 1 TABLET BY MOUTH ONCE TO TWICE DAILY 01/20/21 01/20/22  Argentina Donovan, PA-C  fluticasone (FLONASE) 50 MCG/ACT nasal spray PLACE 2 SPRAYS INTO BOTH NOSTRILS DAILY. 01/20/21 01/20/22  Argentina Donovan, PA-C  hydrochlorothiazide (HYDRODIURIL) 25 MG tablet TAKE 1 TABLET (25 MG TOTAL) BY MOUTH DAILY. 01/20/21 01/20/22  Argentina Donovan, PA-C  meloxicam (MOBIC) 15 MG tablet TAKE 1 TABLET (15 MG TOTAL) BY MOUTH DAILY. AS NEEDED FOR PAIN; TAKE AFTER EATING 01/20/21 01/20/22  Argentina Donovan, PA-C  Multiple Vitamin (MULTIVITAMIN WITH MINERALS) TABS tablet Take 1 tablet by mouth daily.    [provider]  pantoprazole (PROTONIX) 40 MG tablet TAKE ONE TABLET BY MOUTH ONCE TO TWICE DAILY 01/20/21 01/20/22  Argentina Donovan, PA-C  polyethylene glycol (MIRALAX / GLYCOLAX) 17 g packet Take 17 g by mouth 2 (two) times daily as needed. 02/07/19   Armbruster, Carlota Raspberry, MD    Allergies    Patient has no known allergies.  Review of Systems   Review of Systems  HENT:  Positive for ear discharge and ear pain.   Respiratory:  Positive for cough and shortness of breath.   Cardiovascular:  Negative for chest pain.  Gastrointestinal:  Negative for abdominal pain, nausea and vomiting.   Physical Exam Updated Vital Signs BP (!) 152/91 (BP Location: Right Arm)    Pulse 81    Temp 98.4 F (36.9 C) (Oral)    Resp 18    Ht 5\' 10"  (1.778 m)    Wt 113.4 kg    SpO2 94%    BMI 35.87 kg/m   Physical Exam Vitals and nursing note reviewed. Exam conducted with a chaperone present.  Constitutional:       Appearance: Normal appearance.  HENT:     Head: Normocephalic.     Nose: Congestion present.     Mouth/Throat:     Pharynx: No posterior oropharyngeal erythema.  Eyes:     Extraocular Movements: Extraocular movements intact.     Pupils: Pupils are equal, round, and reactive to light.  Cardiovascular:     Rate and Rhythm: Normal rate and regular rhythm.  Pulmonary:     Effort: Pulmonary effort is normal.     Breath sounds: Normal breath sounds.     Comments: Lungs CTA bilaterally. No accessory muscle use. Speaking in complete sentences.  Abdominal:     General: Abdomen is flat.     Palpations: Abdomen is soft.  Musculoskeletal:     Cervical back: Normal range of motion.  Neurological:     Mental Status: She is alert.  Psychiatric:        Mood and Affect: Mood normal.    ED Results / Procedures / Treatments   Labs (all labs ordered are listed, but only abnormal results are displayed) Labs Reviewed  RESP PANEL BY RT-PCR (FLU A&B, COVID) [id number]  CBC WITH DIFFERENTIAL/PLATELET  BASIC METABOLIC PANEL    EKG None  Radiology DG Chest 2 View  Result Date: 10/23/2021 CLINICAL DATA:  Shortness of breath. EXAM: CHEST - 2 VIEW COMPARISON:  05/26/2012 FINDINGS: The heart size and mediastinal contours are within normal limits. Both lungs are clear. Spondylosis noted within the thoracic spine. IMPRESSION: No active cardiopulmonary disease. Electronically Signed   By: Kerby Moors M.D.   On: 10/23/2021 07:29    Procedures Procedures   Medications Ordered in ED Medications  albuterol (VENTOLIN HFA) 108 (90 Base) MCG/ACT inhaler 2 puff (2 puffs Inhalation Given 10/23/21 0803)    ED Course  I have reviewed the triage vital signs and the nursing notes.  Pertinent labs & imaging results that were available during my care of the patient were reviewed by me and considered in my medical decision making (see chart for details).    MDM Rules/Calculators/A&P                          This is a 60 year old female presenting with viral symptoms and shortness of breath.  She been out of her asthma inhaler 3 days ago, while I do not auscultate any wheezes on exam I will refill her albuterol inhaler and reassess.  Viral panel ordered as well as basic labs given age.  Patient was satting between 90 to 94% during triage.  This appears improved with rest, she denies feeling short of breath.  Is not outright hypoxic, not febrile or tachycardic.  No tachypnea.  I do not suspect she has a PE especially given no chest pain.  Appears to be more of a viral presentation.  TM is unremarkable, no signs of AOM or external otitis.  No discharge, hearing well without deficits.  Viral panel is negative, CBC is unremarkable without any leukocytosis or anemia.  Radiograph ordered in triage is negative for any underlying pneumonia, no widened mediastinum or cardiomegaly concerning for cardiovascular process at this time.  Will prescribe cough medicine short course of steroids due to her underlying asthma.  Patient discharged in stable condition.     Final Clinical Impression(s) / ED Diagnoses Final diagnoses:  None    Rx / DC Orders ED Discharge Orders     None        Sherrill Raring, Hershal Coria 10/23/21 1618    Lacretia Leigh, MD 10/27/21 1304

## 2021-12-28 ENCOUNTER — Ambulatory Visit: Payer: Medicare Other | Admitting: Sports Medicine

## 2021-12-28 VITALS — BP 122/82 | Ht 70.0 in | Wt 240.0 lb

## 2021-12-28 DIAGNOSIS — M25561 Pain in right knee: Secondary | ICD-10-CM | POA: Diagnosis not present

## 2021-12-28 DIAGNOSIS — M25562 Pain in left knee: Secondary | ICD-10-CM | POA: Diagnosis not present

## 2021-12-28 DIAGNOSIS — G8929 Other chronic pain: Secondary | ICD-10-CM

## 2021-12-28 DIAGNOSIS — M170 Bilateral primary osteoarthritis of knee: Secondary | ICD-10-CM

## 2021-12-28 DIAGNOSIS — M25552 Pain in left hip: Secondary | ICD-10-CM | POA: Diagnosis not present

## 2021-12-28 MED ORDER — MELOXICAM 15 MG PO TABS: 15.0000 mg | ORAL_TABLET | Freq: Every day | ORAL | 0 refills | Status: AC | PRN

## 2021-12-28 MED ORDER — METHYLPREDNISOLONE ACETATE 40 MG/ML IJ SUSP
40.0000 mg | Freq: Once | INTRAMUSCULAR | Status: AC
  Administered 2021-12-28: 40 mg via INTRA_ARTICULAR

## 2021-12-28 NOTE — Progress Notes (Signed)
? ?  Subjective:  ? ? Patient ID: [name], female    DOB: [birth year], 61 y.o.   MRN: [id number] ? ?HPI chief complaint: Bilateral knee pain ? ?Patient presents today with returning bilateral knee pain.  She has a well-documented history of bilateral knee DJD.  No recent trauma.  Pain began to return several weeks ago.  It is identical in nature to what she is experienced previously with her arthritis pain.  She usually takes meloxicam but she is out. ? ?Intra medical history reviewed ?Medications reviewed ?Allergies reviewed ? ?Review of Systems ?As above ?   ?Objective:  ? Physical Exam ? ?Well-developed, well-nourished.  No acute distress ? ?Examination of both knees shows range of motion from 0 to about 120 degrees.  1+ effusion bilaterally.  No tenderness to palpation.  Knees are grossly stable ligamentous exam. ? ? ?   ?Assessment & Plan:  ? ?Bilateral knee pain secondary to DJD ? ?Each of the patient's knees were injected with cortisone today.  This was done atraumatically under sterile technique utilizing an anterior lateral approach.  She tolerates this without difficulty.  I would like to get some updated x-rays of both knees and I will refer the patient to Dr. Mayer Camel to discuss merits of total knee arthroplasty.  Of note, she also has a severely arthritic left hip so she may elect to undergo hip arthroplasty before having surgery on her knees.  I will also refill her meloxicam for her to take as needed.  Follow-up with me as needed. ? ?Consent obtained and verified. ?Time-out conducted. ?Noted no overlying erythema, induration, or other signs of local infection. ?Skin prepped in a sterile fashion. ?Topical analgesic spray: Ethyl chloride. ?Joint: Right knee ?Needle: 1.5 inch 25-gauge ?Completed without difficulty. ?Meds: 3 cc 1% Xylocaine, 1 cc (40 mg) Depo-Medrol ? ?Advised to call if fevers/chills, erythema, induration, drainage, or persistent bleeding.  ? ?Consent obtained and verified. ?Time-out  conducted. ?Noted no overlying erythema, induration, or other signs of local infection. ?Skin prepped in a sterile fashion. ?Topical analgesic spray: Ethyl chloride. ?Joint: Left knee ?Needle: 1.5 inch 25-gauge ?Completed without difficulty. ?Meds: 3 cc 1% Xylocaine, 1 cc (40 mg) Depo-Medrol ? ?Advised to call if fevers/chills, erythema, induration, drainage, or persistent bleeding.  ? ?This note was dictated using Dragon naturally speaking software and may contain errors in syntax, spelling, or content which have not been identified prior to signing this note.  ?

## 2021-12-28 NOTE — Patient Instructions (Signed)
We are going to refer you to Dr. Mayer Camel to discuss knee replacements for both knees. ?They will contact you to schedule an appt. ? ?Dr. Mayer Camel ?[place] [facility] ?[phone] ? ? ?For primary care physician we recommend Dr. Sela Hilding ?[facility] [place] ?[address] ?[place], [place] [address] ?Phone: [phone] ?

## 2021-12-28 NOTE — Addendum Note (Signed)
Addended by: Jolinda Croak E on: 12/28/2021 12:01 PM ? ? Modules accepted: Orders ? ?

## 2022-02-02 DIAGNOSIS — R202 Paresthesia of skin: Secondary | ICD-10-CM | POA: Diagnosis not present

## 2022-02-15 DIAGNOSIS — D1779 Benign lipomatous neoplasm of other sites: Secondary | ICD-10-CM | POA: Diagnosis not present

## 2022-02-15 DIAGNOSIS — G9389 Other specified disorders of brain: Secondary | ICD-10-CM | POA: Diagnosis not present

## 2022-03-01 ENCOUNTER — Ambulatory Visit: Payer: Medicare Other | Admitting: Dermatology

## 2022-03-01 DIAGNOSIS — R208 Other disturbances of skin sensation: Secondary | ICD-10-CM

## 2022-03-01 DIAGNOSIS — L83 Acanthosis nigricans: Secondary | ICD-10-CM

## 2022-03-01 DIAGNOSIS — D485 Neoplasm of uncertain behavior of skin: Secondary | ICD-10-CM | POA: Diagnosis not present

## 2022-03-01 NOTE — Patient Instructions (Signed)
OTC- Cereve pramoxine.  ?

## 2022-03-02 ENCOUNTER — Encounter: Payer: Self-pay | Admitting: Dermatology

## 2022-03-02 NOTE — Progress Notes (Signed)
? ?  New Patient ?  ?Subjective  ?[name] is a 61 y.o. female who presents for the following: Skin Problem (Patient has been having a burning sensation on right side face x 5 months. Comes and goes. It does itch sometimes. No treatment. Just cold compress. Went to ER and got MRI. ). ? ?Burning sensation of her right cheek for the past 5 months ?Location:  ?Duration:  ?Quality:  ?Associated Signs/Symptoms: ?Modifying Factors:  ?Severity:  ?Timing: ?Context:  ? ? ?The following portions of the chart were reviewed this encounter and updated as appropriate:  Tobacco  Allergies  Meds  Problems  Med Hx  Surg Hx  Fam Hx   ?  ? ?Objective  ?Well appearing patient in no apparent distress; mood and affect are within normal limits. ?Right Buccal Cheek ?Patient reports that the past she had a similar sensation on her left cheek which spontaneously resolved.  The burning sensation on the right cheek is episodic rather than constant.  She initially saw a doctor who put her on prednisone and an antibiotic (presumably antiviral).  But the burning persists.  She does not recall ever having blisters.  She is scheduled to see a neurologist to discuss the results of her MRI.  Objectively there is no sign of current or past shingles on her right cheek.  No adenopathy. ? ?Neck - Anterior ? ? ? ? ?Left Lower Leg - Posterior ? ? ? ? ? ? ?A focused examination was performed including head and neck including lymph nodes.. Relevant physical exam findings are noted in the Assessment and Plan. ? ? ?Assessment & Plan  ?Burning sensation of skin ?Right Buccal Cheek ? ?I explained to [name] that her symptoms almost certainly are neurogenic.  Her MRI will hopefully exclude central neurogenic origin, but is less likely to be helpful if this is a post herpetic sensory change or variant of trigeminal neuralgia.  Although I frequently see a disorder called cutaneous dysesthesia with burning and stinging, this is uncommonly diagnosed with  unilateral facial involvement.  She may try pramoxine topically to see if this provides some relief.  Alternatively, I told of the neurologist or her primary care physician may discuss use of neuroactive agents like gabapentin or Lyrica.  Follow-up with me can be on a as needed basis. ? ?Acanthosis nigricans ?Neck - Anterior ? ?Neoplasm of uncertain behavior of skin ?Left Lower Leg - Posterior ? ?Will schedule to have lesion biopsied.  ? ? ?

## 2022-03-12 ENCOUNTER — Ambulatory Visit
Admission: EM | Admit: 2022-03-12 | Discharge: 2022-03-12 | Disposition: A | Discharge status: Other Acute Inpatient Hospital | Payer: Medicare Other | Attending: Internal Medicine | Admitting: Internal Medicine

## 2022-03-12 DIAGNOSIS — R3911 Hesitancy of micturition: Secondary | ICD-10-CM | POA: Diagnosis not present

## 2022-03-12 DIAGNOSIS — J069 Acute upper respiratory infection, unspecified: Secondary | ICD-10-CM | POA: Diagnosis not present

## 2022-03-12 DIAGNOSIS — H65193 Other acute nonsuppurative otitis media, bilateral: Secondary | ICD-10-CM

## 2022-03-12 MED ORDER — PREDNISONE 20 MG PO TABS: 40.0000 mg | ORAL_TABLET | Freq: Every day | ORAL | 0 refills | Status: AC

## 2022-03-12 NOTE — ED Triage Notes (Signed)
Pt c/o cough, light-headed, nasal congestion, ear pressure. States this has been ongoing and she been seen for this several times. Onset ~ Monday. Reports sxs resolving after her previous rx but has returned.

## 2022-03-12 NOTE — ED Notes (Signed)
Patient is being discharged from the Urgent Care and sent to the Emergency Department via private vehicle . Per [place]da Acres patient is in need of higher level of care due to further evaluation. Patient is aware and verbalizes understanding of plan of care.  Vitals:   03/12/22 0828  BP: 120/71  Pulse: 76  Resp: 18  Temp: 98.2 F (36.8 C)  SpO2: 96%

## 2022-03-12 NOTE — Discharge Instructions (Addendum)
Go to the [facility] ER when you leave urgent care for further evaluation and management of your urinary symptoms.  You have been prescribed prednisone to alleviate your upper respiratory and ear discomfort.

## 2022-03-12 NOTE — ED Provider Notes (Signed)
[facility]    CSN: [id number] Arrival date & time: 03/12/22  0807      History   Chief Complaint Chief Complaint  Patient presents with   Cough    HPI [name] is a 61 y.o. female.   Patient presents with multiple different chief complaints today.  Patient reports cough, dizziness, headache, nasal congestion, bilateral ear pressure and discomfort that started approximately 5 to 6 days ago.  Denies any known sick contacts or fever.  Denies chest pain, shortness of breath, sore throat, nausea, vomiting, diarrhea, abdominal pain.  Patient does have a history of asthma and has had recurrent issues with fluid in her ears.  Has been treated with prednisone a few months prior with resolution of symptoms.  Patient has taken Claritin and over-the-counter cold and flu medications for symptoms with minimal improvement.  Patient also reports that she has some lower abdominal pain in the suprapubic area that hurts when she coughs.  Reports that she been having some issues urinating.  The symptoms started about 5 days ago.  She reports that she only has "dribbling" when she goes to the bathroom and does not feel like her bladder is emptying completely.  She has a history of having to have her "bladder tacked".  She is not sure the reason why she had this completed but reports that it was completed when she had her hysterectomy when she was about 61 years old.  Patient has had intermittent issues with urinary hesitancy at times and has had to have catheterizations due to her bladder.  Denies urinary burning, hematuria, abnormal vaginal bleeding, back pain, fever.   Cough  Past Medical History:  Diagnosis Date   Allergy    uses Albuterol inhaler during allergy season   Diverticulosis 2013   Colonoscopy   GERD (gastroesophageal reflux disease)    Hiatal hernia    CT abdomen in August 2013   Hx of colonic polyps 2013   Colonoscopy    Osteoarthritis    b/l hip, seen on CT abd  in 05/2012    Patient Active Problem List   Diagnosis Date Noted   Asymptomatic varicose veins of bilateral lower extremities 07/29/2019   Chronic venous insufficiency 07/29/2019   Left hip pain 07/29/2019   Protein-calorie malnutrition, severe 01/13/2019   Dehydration 01/10/2019   Hypercalcemia 01/10/2019   Sinus bradycardia 01/10/2019   Difficulty swallowing 01/10/2019   LLQ abdominal pain 01/10/2019   Calculus of gallbladder without cholecystitis without obstruction 12/31/2018   History of nicotine dependence 12/31/2018   Hiccups 12/31/2018   Dysphagia 12/31/2018   History of asthma 12/31/2018   Rash and nonspecific skin eruption 05/01/2013   DJD (degenerative joint disease) of knee 11/19/2012   HLD (hyperlipidemia) 11/13/2012   Osteoarthritis of left knee 07/26/2012   GERD (gastroesophageal reflux disease) 06/15/2012   Health care maintenance 06/15/2012    Past Surgical History:  Procedure Laterality Date   BIOPSY  01/12/2019   Procedure: BIOPSY;  Surgeon: Jackquline Denmark, MD;  Location: [facility] ENDOSCOPY;  Service: Endoscopy;;   ESOPHAGOGASTRODUODENOSCOPY (EGD) WITH PROPOFOL N/A 01/12/2019   Procedure: ESOPHAGOGASTRODUODENOSCOPY (EGD) WITH PROPOFOL;  Surgeon: Jackquline Denmark, MD;  Location: [facility] ENDOSCOPY;  Service: Endoscopy;  Laterality: N/A;   MALONEY DILATION  01/12/2019   Procedure: Venia Minks DILATION;  Surgeon: Jackquline Denmark, MD;  Location: [facility] ENDOSCOPY;  Service: Endoscopy;;   TONSILLECTOMY     childhood   TOTAL ABDOMINAL HYSTERECTOMY W/ BILATERAL SALPINGOOPHORECTOMY  2008   Fibroids, done by  Dr. Clovia Cuff at [place]  3   Para  3   Term      Preterm      AB      Living         SAB      IAB      Ectopic      Multiple      Live Births               Home Medications    Prior to Admission medications   Medication Sig Start Date End Date Taking? Authorizing Provider  predniSONE (DELTASONE) 20 MG tablet Take 2  tablets (40 mg total) by mouth daily for 5 days. 03/12/22 03/17/22 Yes , Michele Rockers, FNP  albuterol (VENTOLIN HFA) 108 (90 Base) MCG/ACT inhaler INHALE 2 PUFFS INTO THE LUNGS EVERY 6 (SIX) HOURS AS NEEDED. FOR SHORTNESS OF BREATH 01/20/21 01/20/22  Argentina Donovan, PA-C  aspirin 81 MG chewable tablet Chew 81 mg by mouth. Pt taking "some" days    [provider]  cetirizine (ZYRTEC ALLERGY) 10 MG tablet Take 1 tablet (10 mg total) by mouth daily. 02/18/21   Mayers, Cari S, PA-C  famotidine (PEPCID) 20 MG tablet TAKE 1 TABLET BY MOUTH ONCE TO TWICE DAILY 01/20/21 01/20/22  Argentina Donovan, PA-C  fluticasone (FLONASE) 50 MCG/ACT nasal spray PLACE 2 SPRAYS INTO BOTH NOSTRILS DAILY. 01/20/21 01/20/22  Argentina Donovan, PA-C  hydrochlorothiazide (HYDRODIURIL) 25 MG tablet TAKE 1 TABLET (25 MG TOTAL) BY MOUTH DAILY. 01/20/21 01/20/22  Argentina Donovan, PA-C  ibuprofen (ADVIL) 600 MG tablet Take 600 mg by mouth every 6 (six) hours as needed. 12/29/21   [provider]  meloxicam (MOBIC) 15 MG tablet Take 1 tablet (15 mg total) by mouth daily as needed for pain. Take with food. 12/28/21 12/28/22  Thurman Coyer, DO  Multiple Vitamin (MULTIVITAMIN WITH MINERALS) TABS tablet Take 1 tablet by mouth daily.    [provider]  pantoprazole (PROTONIX) 40 MG tablet TAKE ONE TABLET BY MOUTH ONCE TO TWICE DAILY 01/20/21 01/20/22  Argentina Donovan, PA-C  polyethylene glycol (MIRALAX / GLYCOLAX) 17 g packet Take 17 g by mouth 2 (two) times daily as needed. 02/07/19   Armbruster, Carlota Raspberry, MD    Family History Family History  Problem Relation Age of Onset   Stroke Father        passed away at 49 yo of cerebal brain hemorrhage    Hypertension Mother    Hyperlipidemia Mother    Colon cancer Neg Hx    Stomach cancer Neg Hx    Colon polyps Neg Hx    Rectal cancer Neg Hx     Social History Social History   Tobacco Use   Smoking status: Every Day    Packs/day: 0.50    Years: 31.00    Pack  years: 15.50    Types: Cigarettes   Smokeless tobacco: Never  Vaping Use   Vaping Use: Never used  Substance Use Topics   Alcohol use: Not Currently    Alcohol/week: 2.0 standard drinks    Types: 2 Glasses of wine per week    Comment: 1-2 beers/week   Drug use: No     Allergies   Patient has no known allergies.   Review of Systems Review of Systems Per HPI  Physical Exam Triage Vital Signs ED Triage Vitals  Enc Vitals Group  BP 03/12/22 0828 120/71     Pulse Rate 03/12/22 0828 76     Resp 03/12/22 0828 18     Temp 03/12/22 0828 98.2 F (36.8 C)     Temp Source 03/12/22 0828 Oral     SpO2 03/12/22 0828 96 %     Weight --      Height --      Head Circumference --      Peak Flow --      Pain Score 03/12/22 0829 0     Pain Loc --      Pain Edu? --      Excl. in [place]? --    No data found.  Updated Vital Signs BP 120/71 (BP Location: Left Arm)   Pulse 76   Temp 98.2 F (36.8 C) (Oral)   Resp 18   SpO2 96%   Visual Acuity Right Eye Distance:   Left Eye Distance:   Bilateral Distance:    Right Eye Near:   Left Eye Near:    Bilateral Near:     Physical Exam Exam conducted with a chaperone present.  Constitutional:      General: She is not in acute distress.    Appearance: Normal appearance. She is not toxic-appearing or diaphoretic.  HENT:     Head: Normocephalic and atraumatic.     Right Ear: Ear canal normal. No drainage, swelling or tenderness. A middle ear effusion is present. Tympanic membrane is not perforated, erythematous or bulging.     Left Ear: Ear canal normal. No drainage, swelling or tenderness. A middle ear effusion is present. Tympanic membrane is not perforated, erythematous or bulging.     Nose: Congestion present.     Mouth/Throat:     Mouth: Mucous membranes are moist.     Pharynx: No posterior oropharyngeal erythema.  Eyes:     Extraocular Movements: Extraocular movements intact.     Conjunctiva/sclera: Conjunctivae normal.      Pupils: Pupils are equal, round, and reactive to light.  Cardiovascular:     Rate and Rhythm: Normal rate and regular rhythm.     Pulses: Normal pulses.     Heart sounds: Normal heart sounds.  Pulmonary:     Effort: Pulmonary effort is normal. No respiratory distress.     Breath sounds: Normal breath sounds. No stridor. No wheezing, rhonchi or rales.  Abdominal:     General: Abdomen is flat. Bowel sounds are normal.     Palpations: Abdomen is soft.  Genitourinary:    Urethra: No prolapse or urethral swelling.     Comments: There were no obvious abnormalities on pelvic exam.  No obvious bladder or urethral prolapse.  No obvious vaginal prolapse. Musculoskeletal:        General: Normal range of motion.     Cervical back: Normal range of motion.  Skin:    General: Skin is warm and dry.  Neurological:     General: No focal deficit present.     Mental Status: She is alert and oriented to person, place, and time. Mental status is at baseline.  Psychiatric:        Mood and Affect: Mood normal.        Behavior: Behavior normal.     UC Treatments / Results  Labs (all labs ordered are listed, but only abnormal results are displayed) Labs Reviewed - No data to display  EKG   Radiology No results found.  Procedures Procedures (including critical care time)  Medications  Ordered in UC Medications - No data to display  Initial Impression / Assessment and Plan / UC Course  I have reviewed the triage vital signs and the nursing notes.  Pertinent labs & imaging results that were available during my care of the patient were reviewed by me and considered in my medical decision making (see chart for details).     Patient presents with symptoms likely from a viral upper respiratory infection. Differential includes bacterial pneumonia, sinusitis, allergic rhinitis, COVID-19, flu. Do not suspect underlying cardiopulmonary process. Symptoms seem unlikely related to ACS, CHF or COPD  exacerbations, pneumonia, pneumothorax. Patient is nontoxic appearing and not in need of emergent medical intervention.   Recommended symptom control with over the counter medications.  Patient declined benzonatate for cough.  Will prescribe prednisone steroid given bilateral middle ear effusion as well as patient's history of asthma in the setting of acute illness.  Patient has tolerated prednisone well in the past. Return if symptoms fail to improve in 1-2 weeks or you develop shortness of breath, chest pain, severe headache. Patient states understanding and is agreeable.  Patient was not able to provide a urine sample in urgent care today to assess for urinary tract infection related to urinary symptoms.  There was no obvious abnormalities on the pelvic exam that would indicate bladder prolapse causing symptoms.  Although, it is very concerning that patient is having urinary hesitancy and has had 5 days of not being able to fully empty bladder.  Do not feel comfortable doing catheterization here in urgent care given patient's extensive history of bladder problems.  Patient was advised that it would be best for her to go to the emergency department for further evaluation and management of this issue to ensure adequate evaluation and management.  Patient was agreeable with plan and left to go to the emergency department via self transport.     Final Clinical Impressions(s) / UC Diagnoses   Final diagnoses:  Acute MEE (middle ear effusion), bilateral  Viral upper respiratory tract infection with cough  Urinary hesitancy     Discharge Instructions      Go to the [facility] ER when you leave urgent care for further evaluation and management of your urinary symptoms.  You have been prescribed prednisone to alleviate your upper respiratory and ear discomfort.    ED Prescriptions     Medication Sig Dispense Auth. Provider   predniSONE (DELTASONE) 20 MG tablet Take 2 tablets (40 mg  total) by mouth daily for 5 days. 10 tablet Teodora Medici, [place]      PDMP not reviewed this encounter.   Teodora Medici,  03/12/22 [phone]

## 2022-03-16 ENCOUNTER — Ambulatory Visit: Payer: Medicare Other | Admitting: Dermatology

## 2022-03-17 DIAGNOSIS — R9402 Abnormal brain scan: Secondary | ICD-10-CM | POA: Diagnosis not present

## 2022-03-17 DIAGNOSIS — R202 Paresthesia of skin: Secondary | ICD-10-CM | POA: Diagnosis not present

## 2022-03-19 NOTE — Addendum Note (Signed)
Addended by: Lavonna Monarch on: 03/19/2022 11:00 AM   Modules accepted: Level of Service

## 2022-03-22 ENCOUNTER — Encounter: Payer: Self-pay | Admitting: Family Medicine

## 2022-03-22 ENCOUNTER — Ambulatory Visit (INDEPENDENT_AMBULATORY_CARE_PROVIDER_SITE_OTHER): Payer: Medicare Other | Admitting: Family Medicine

## 2022-03-22 VITALS — BP 120/80 | HR 74 | Ht 70.0 in | Wt 269.0 lb

## 2022-03-22 DIAGNOSIS — Z1211 Encounter for screening for malignant neoplasm of colon: Secondary | ICD-10-CM

## 2022-03-22 DIAGNOSIS — I10 Essential (primary) hypertension: Secondary | ICD-10-CM | POA: Diagnosis not present

## 2022-03-22 DIAGNOSIS — R030 Elevated blood-pressure reading, without diagnosis of hypertension: Secondary | ICD-10-CM | POA: Diagnosis not present

## 2022-03-22 DIAGNOSIS — Z720 Tobacco use: Secondary | ICD-10-CM | POA: Diagnosis not present

## 2022-03-22 DIAGNOSIS — R7309 Other abnormal glucose: Secondary | ICD-10-CM

## 2022-03-22 DIAGNOSIS — J309 Allergic rhinitis, unspecified: Secondary | ICD-10-CM | POA: Diagnosis not present

## 2022-03-22 DIAGNOSIS — Z1159 Encounter for screening for other viral diseases: Secondary | ICD-10-CM | POA: Diagnosis not present

## 2022-03-22 DIAGNOSIS — K2100 Gastro-esophageal reflux disease with esophagitis, without bleeding: Secondary | ICD-10-CM

## 2022-03-22 DIAGNOSIS — I83893 Varicose veins of bilateral lower extremities with other complications: Secondary | ICD-10-CM

## 2022-03-22 DIAGNOSIS — G518 Other disorders of facial nerve: Secondary | ICD-10-CM | POA: Diagnosis not present

## 2022-03-22 DIAGNOSIS — Z0000 Encounter for general adult medical examination without abnormal findings: Secondary | ICD-10-CM | POA: Diagnosis not present

## 2022-03-22 DIAGNOSIS — R7303 Prediabetes: Secondary | ICD-10-CM | POA: Insufficient documentation

## 2022-03-22 MED ORDER — FAMOTIDINE 20 MG PO TABS: ORAL_TABLET | ORAL | 4 refills | Status: DC

## 2022-03-22 MED ORDER — CETIRIZINE HCL 10 MG PO TABS: 10.0000 mg | ORAL_TABLET | Freq: Every day | ORAL | 11 refills | Status: DC

## 2022-03-22 MED ORDER — PANTOPRAZOLE SODIUM 40 MG PO TBEC: DELAYED_RELEASE_TABLET | ORAL | 4 refills | Status: DC

## 2022-03-22 MED ORDER — AMITRIPTYLINE HCL 25 MG PO TABS: 25.0000 mg | ORAL_TABLET | Freq: Every day | ORAL | 0 refills | Status: DC

## 2022-03-22 MED ORDER — HYDROCHLOROTHIAZIDE 25 MG PO TABS: ORAL_TABLET | Freq: Every day | ORAL | 3 refills | Status: DC

## 2022-03-22 NOTE — Assessment & Plan Note (Signed)
A1c in 08/2020 elevated to 5.9.  Physical exam with acanthosis nigricans and obesity. - A1c today

## 2022-03-22 NOTE — Assessment & Plan Note (Signed)
-   Discussed mammogram, patient still elevated - Referral for colonoscopy with [place] placed - Hepatitis C screening today

## 2022-03-22 NOTE — Patient Instructions (Addendum)
We are going to do a trial of amitriptyline to see if that helps your nerve pain in your cheek.  If it does not help over the next 1 to 2 weeks then call our office and let me know and we can try to switch medications.  If the gabapentin is not helping you do not need to take it.  I do recommend getting a mammogram.  The referral for the colonoscopy has already been placed.  We are getting labs to check a hepatitis C screening as well as an A1c to check for diabetes.  For smoking cessation, when you get to the front make an appointment with Dr. Valentina Lucks our clinical pharmacist.  Make sure to say that it is for smoking cessation.

## 2022-03-22 NOTE — Assessment & Plan Note (Signed)
BP 120/80 today. Well controlled on HCTZ '25mg'$  daily. Kidney function last evaluated 10/23/2021. - continue HCTZ

## 2022-03-22 NOTE — Progress Notes (Signed)
Subjective:    Patient ID: [name], female    DOB: Jan 08, 1961, 61 y.o.   MRN: [id number]   CC: New Patient  HPI:  Patient's insurance recently changed and so she is now transferring care to our clinic.  Patient placed the following concerns today.  - Burning sensation of the right cheek: Patient reports that since January she has had a burning painful sensation that occurs daily.  She reports episodes lasting 5 to 10 minutes multiple times per day.  Was seen by neurology in April and had an MRI that was negative.  She was given gabapentin 100 mg twice daily with no improvement.  Patient does report a history of similar symptoms many years ago on the left side that was "treated like shingles and went away in a week".  - Mole on Left Leg: Has been present since 2008/2009 and initially started as a flat lesion that has since progressed to a large amount.  She is following up with dermatology on 03/30/2022 for evaluation/removal.  - Patient believes that she is due for her colonoscopy.  She also notes that she has not had a mammogram in quite some time due to the discomfort and the fact that she had to undergo further evaluation due to her dense breast with no abnormalities noted.  - Patient has a history of significant hip and knee arthritis, which has qualified her for Medicare.  She is currently on meloxicam 15 mg daily  -Tobacco use: Patient went to smoke about 1/2 PPD of cigarettes.  She is very interested in quitting and has attempted patches before with no success.  Does report that her mother previously tried Wellbutrin with great success.    PMHx: Past Medical History:  Diagnosis Date   Allergy    uses Albuterol inhaler during allergy season   Dehydration 01/10/2019   Difficulty swallowing 01/10/2019   Diverticulosis 10/25/2011   Colonoscopy   Dysphagia 12/31/2018   GERD (gastroesophageal reflux disease)    Hiatal hernia    CT abdomen in August 2013   Hiccups 12/31/2018   Hx  of colonic polyps 10/25/2011   Colonoscopy    Hypertension    Left hip pain 07/29/2019   LLQ abdominal pain 01/10/2019   Osteoarthritis    b/l hip, seen on CT abd in 05/2012     Surgical Hx: Past Surgical History:  Procedure Laterality Date   BIOPSY  01/12/2019   Procedure: BIOPSY;  Surgeon: Jackquline Denmark, MD;  Location: [facility] ENDOSCOPY;  Service: Endoscopy;;   ESOPHAGOGASTRODUODENOSCOPY (EGD) WITH PROPOFOL N/A 01/12/2019   Procedure: ESOPHAGOGASTRODUODENOSCOPY (EGD) WITH PROPOFOL;  Surgeon: Jackquline Denmark, MD;  Location: [facility] ENDOSCOPY;  Service: Endoscopy;  Laterality: N/A;   MALONEY DILATION  01/12/2019   Procedure: Venia Minks DILATION;  Surgeon: Jackquline Denmark, MD;  Location: [facility] ENDOSCOPY;  Service: Endoscopy;;   TONSILLECTOMY     childhood   TOTAL ABDOMINAL HYSTERECTOMY W/ BILATERAL SALPINGOOPHORECTOMY  2008   Fibroids, done by Dr. Clovia Cuff at [facility]     Family Hx: Family History  Problem Relation Age of Onset   Stroke Father        passed away at 19 yo of cerebal brain hemorrhage    Hypertension Mother    Hyperlipidemia Mother    Colon cancer Neg Hx    Stomach cancer Neg Hx    Colon polyps Neg Hx    Rectal cancer Neg Hx      Social Hx: Current Social History 03/22/2022  Who lives at home: Mother (110 years old), daughter, (41 years old), grandson (76 years old) Who would speak for you about health care matters: Mother/daughter Transportation: Lungs: Important Relationships & Pets: Family Current Stressors: None Work / Education: None, disabled Religious / Personal Beliefs: None Interests / Fun: Movies, going up, short   Medications: Current Meds  Medication Sig   amitriptyline (ELAVIL) 25 MG tablet Take 1 tablet (25 mg total) by mouth at bedtime.   aspirin 81 MG chewable tablet Chew 81 mg by mouth. Pt taking "some" days   cetirizine (ZYRTEC ALLERGY) 10 MG tablet Take 1 tablet (10 mg total) by mouth daily.   famotidine (PEPCID) 20 MG tablet TAKE 1 TABLET BY  MOUTH ONCE TO TWICE DAILY   gabapentin (NEURONTIN) 100 MG capsule Take 100 mg by mouth 2 (two) times daily.   hydrochlorothiazide (HYDRODIURIL) 25 MG tablet TAKE 1 TABLET (25 MG TOTAL) BY MOUTH DAILY.   ibuprofen (ADVIL) 600 MG tablet Take 600 mg by mouth every 6 (six) hours as needed.   meloxicam (MOBIC) 15 MG tablet Take 1 tablet (15 mg total) by mouth daily as needed for pain. Take with food.   Multiple Vitamin (MULTIVITAMIN WITH MINERALS) TABS tablet Take 1 tablet by mouth daily.   pantoprazole (PROTONIX) 40 MG tablet TAKE ONE TABLET BY MOUTH ONCE TO TWICE DAILY   polyethylene glycol (MIRALAX / GLYCOLAX) 17 g packet Take 17 g by mouth 2 (two) times daily as needed.      Preventative Screening Colonoscopy: 2013. Mild diverticulosis in the descending and sigmoid colon.  Sessile polyps in the sigmoid colon Mammogram: 2008, required further work-up that was negative. Pap test: N/A, complete hysterectomy.  Severe fibroids  Smoking status reviewed  Review of Systems   Objective:  BP 120/80   Pulse 74   Ht '5\' 10"'$  (1.778 m)   Wt 269 lb (122 kg)   SpO2 98%   BMI 38.60 kg/m  Vitals and nursing note reviewed  General: well nourished, in no acute distress HEENT: normocephalic, TM's visualized bilaterally, no scleral icterus or conjunctival pallor, no nasal discharge, moist mucous membranes, good dentition without erythema or discharge noted in posterior oropharynx Neck: supple, full ROM Cardiac: RRR, clear S1 and S2, no murmurs, rubs, or gallops Respiratory: clear to auscultation bilaterally, no increased work of breathing Abdomen: soft, nontender, nondistended, no masses or organomegaly. Bowel sounds present Extremities: no edema or cyanosis. Warm, well perfused. 2+ radial and PT pulses bilaterally Skin: warm and dry.  Hyperpigmentation of the right cheek along the V2 distribution of the trigeminal nerve.  Acanthosis nigricans of the neck. Neuro: alert and oriented, no focal  deficits   Assessment & Plan:    Tobacco use Patient wanting to quit smoking. Previously failed patches.  - Patient to follow-up with clinical pharm Dr. Valentina Lucks for cessation  Hypertension BP 120/80 today. Well controlled on HCTZ '25mg'$  daily. Kidney function last evaluated 10/23/2021. - continue HCTZ  Facial neuralgia Neuralgia with hyperpigmentation along the trigeminal nerve distribution of the right cheek. Consider trigeminal neuralgia vs post-herpetic neuralgia. Similar episode in 2013 on the left cheek which resolved within 1 week with valacyclovir treatment.  - Trial of amitriptyline '25mg'$  daily - If no improvement, consider carbamazepine trial - Can discontinue gabapentin as no improvement with it - Follow-up in 2 weeks for re-evaluation   Health care maintenance - Discussed mammogram, patient still elevated - Referral for colonoscopy with [place] placed - Hepatitis C screening today  Elevated hemoglobin A1c  A1c in 08/2020 elevated to 5.9.  Physical exam with acanthosis nigricans and obesity. - A1c today  Medications refilled today: -Pantoprazole - Cetirizine - Pepcid - HCTZ   Return in about 4 weeks (around 04/19/2022) for Follow-up facial pain.   Rise Patience, DO, PGY 2

## 2022-03-22 NOTE — Assessment & Plan Note (Addendum)
Neuralgia with hyperpigmentation along the trigeminal nerve distribution of the right cheek, no rash present at this time. Consider trigeminal neuralgia vs post-herpetic neuralgia. Similar episode in 2013 on the left cheek which resolved within 1 week with valacyclovir treatment.  - Trial of amitriptyline '25mg'$  daily - If no improvement, consider carbamazepine trial - Can discontinue gabapentin as no improvement with it - Follow-up in 2 weeks for re-evaluation

## 2022-03-22 NOTE — Assessment & Plan Note (Signed)
Patient wanting to quit smoking. Previously failed patches.  - Patient to follow-up with clinical pharm Dr. Valentina Lucks for cessation

## 2022-03-23 LAB — HCV AB W REFLEX TO QUANT PCR: HCV Ab: NONREACTIVE

## 2022-03-23 LAB — HCV INTERPRETATION

## 2022-03-23 LAB — HEMOGLOBIN A1C
Est. average glucose Bld gHb Est-mCnc: 126 mg/dL
Hgb A1c MFr Bld: 6 % — ABNORMAL HIGH (ref 4.8–5.6)

## 2022-03-30 ENCOUNTER — Ambulatory Visit: Payer: Medicare Other | Admitting: Dermatology

## 2022-03-30 ENCOUNTER — Telehealth: Payer: Self-pay | Admitting: Family Medicine

## 2022-03-30 NOTE — Telephone Encounter (Signed)
Called patient regarding pre-diabetic A1c, patient has no questions regarding this.   She does note maybe some improvement with the amytriptiline but will give it a few more days. She also notes that her neurologist has referred her to get a second opinion with [facility], she is waiting for more information from their office.   Patient's son had surgery. He has Crohn's disease but is currently doing okay.   Gerard Cantara, DO

## 2022-04-21 ENCOUNTER — Encounter: Payer: Self-pay | Admitting: Family Medicine

## 2022-04-21 ENCOUNTER — Ambulatory Visit (INDEPENDENT_AMBULATORY_CARE_PROVIDER_SITE_OTHER): Payer: Medicare Other | Admitting: Family Medicine

## 2022-04-21 VITALS — BP 123/69 | HR 88 | Ht 70.0 in | Wt 254.8 lb

## 2022-04-21 DIAGNOSIS — R7303 Prediabetes: Secondary | ICD-10-CM

## 2022-04-21 DIAGNOSIS — J9801 Acute bronchospasm: Secondary | ICD-10-CM | POA: Diagnosis not present

## 2022-04-21 DIAGNOSIS — E785 Hyperlipidemia, unspecified: Secondary | ICD-10-CM

## 2022-04-21 DIAGNOSIS — G518 Other disorders of facial nerve: Secondary | ICD-10-CM | POA: Diagnosis not present

## 2022-04-21 DIAGNOSIS — I10 Essential (primary) hypertension: Secondary | ICD-10-CM

## 2022-04-21 DIAGNOSIS — R17 Unspecified jaundice: Secondary | ICD-10-CM | POA: Diagnosis not present

## 2022-04-21 MED ORDER — ALBUTEROL SULFATE HFA 108 (90 BASE) MCG/ACT IN AERS: INHALATION_SPRAY | RESPIRATORY_TRACT | 2 refills | Status: DC

## 2022-04-21 MED ORDER — CARBAMAZEPINE ER 100 MG PO CP12: 100.0000 mg | ORAL_CAPSULE | Freq: Two times a day (BID) | ORAL | 1 refills | Status: DC

## 2022-04-21 NOTE — Assessment & Plan Note (Signed)
No improvement with amitriptyline, will trial carbamazepine - LFTs today - Carbamazepine 100 mg twice daily - If no improvement, call in 1 week and we will titrate up the dose - Otherwise, can follow-up in 4 weeks if still symptomatic

## 2022-04-21 NOTE — Assessment & Plan Note (Signed)
Last BMP overall reassuring.  BP well controlled in office today 123/69 - Continue HCTZ

## 2022-04-21 NOTE — Patient Instructions (Addendum)
It was so great seeing you today! Today we discussed the following:  -We are going to get labs to check your liver function as well as your cholesterol today.    -Medication we are going to start is called carbamazepine, you will take this twice a day.  Lets trial this out for the next couple of weeks and see if that helps your symptoms.  If in the next week, the pain is not controlled call the office and I will get back to you about increasing the dose.  - Dr. Mayer Camel is the orthopedic that Dr. Micheline Chapman previously recommended, his contact information is below.  I recommend calling and seeing if you are able to get in with his office [facility]:  [phone]. Call and make appointment for colonoscopy    Please make sure to bring any medications you take to your appointments. If you have any questions or concerns please call the office at [phone].

## 2022-04-21 NOTE — Assessment & Plan Note (Signed)
Lipid panel today

## 2022-04-21 NOTE — Progress Notes (Signed)
    SUBJECTIVE:   CHIEF COMPLAINT / HPI:   Facial pain follow-up: - Amitriptyline did not improve the symptoms - Currently rates the pain 7/10 - Still has hyperpigmentation of the burning area as well - Does have neurology follow-up, but is possibly going to need to change neurologist as the current doctor may be out of her network  Prediabetes - A1c last month was 6.0 - Patient interested in cholesterol levels  PERTINENT  PMH / PSH: Reviewed  OBJECTIVE:   BP 123/69   Pulse 88   Ht '5\' 10"'$  (1.778 m)   Wt 254 lb 12.8 oz (115.6 kg)   SpO2 100%   BMI 36.56 kg/m   General: NAD, well-appearing, well-nourished HEENT: Hyperpigmentation of the right cheek, burning sensation present upon light palpation of the hyperpigmented area Respiratory: No respiratory distress, breathing comfortably, able to speak in full sentences Skin: warm and dry, no rashes noted on exposed skin Psych: Appropriate affect and mood  ASSESSMENT/PLAN:   Hypertension Last BMP overall reassuring.  BP well controlled in office today 123/69 - Continue HCTZ  Facial neuralgia No improvement with amitriptyline, will trial carbamazepine - LFTs today - Carbamazepine 100 mg twice daily - If no improvement, call in 1 week and we will titrate up the dose - Otherwise, can follow-up in 4 weeks if still symptomatic  HLD (hyperlipidemia) - Lipid panel today   Rise Patience, DO [place]

## 2022-04-22 LAB — HEPATIC FUNCTION PANEL
ALT: 30 IU/L (ref 0–32)
AST: 22 IU/L (ref 0–40)
Albumin: 4.3 g/dL (ref 3.8–4.9)
Alkaline Phosphatase: 113 IU/L (ref 44–121)
Bilirubin Total: 0.2 mg/dL (ref 0.0–1.2)
Bilirubin, Direct: <0.1 mg/dL (ref 0.00–0.40)
Total Protein: 6.8 g/dL (ref 6.0–8.5)

## 2022-04-22 LAB — LIPID PANEL
Chol/HDL Ratio: 10.8 ratio — ABNORMAL HIGH (ref 0.0–4.4)
Cholesterol, Total: 302 mg/dL — ABNORMAL HIGH (ref 100–199)
HDL: 28 mg/dL — ABNORMAL LOW (ref >39)
LDL Chol Calc (NIH): 182 mg/dL — ABNORMAL HIGH (ref 0–99)
Triglycerides: 455 mg/dL — ABNORMAL HIGH (ref 0–149)
VLDL Cholesterol Cal: 92 mg/dL — ABNORMAL HIGH (ref 5–40)

## 2022-04-27 ENCOUNTER — Telehealth: Payer: Self-pay | Admitting: Family Medicine

## 2022-04-27 DIAGNOSIS — G518 Other disorders of facial nerve: Secondary | ICD-10-CM

## 2022-04-27 MED ORDER — ROSUVASTATIN CALCIUM 10 MG PO TABS: 10.0000 mg | ORAL_TABLET | Freq: Every day | ORAL | 3 refills | Status: DC

## 2022-04-27 NOTE — Telephone Encounter (Signed)
Called patient regarding results of cholesterol and LFT panels. Cholesterol is elevated compared to prior and ASCVD risk score is listed below. Given this score, discussed with patient recommendation to consider statin medication. Patient is agreeable and we will start crestor '10mg'$  daily.  The 10-year ASCVD risk score (Arnett DK, et al., 2019) is: 23.7%   Values used to calculate the score:     Age: 61 years     Sex: Female     Is Non-Hispanic African American: Yes     Diabetic: No     Tobacco smoker: Yes     Systolic Blood Pressure: 165 mmHg     Is BP treated: Yes     HDL Cholesterol: 28 mg/dL     Total Cholesterol: 302 mg/dL   Patient also notes that her neurologist is currently not in network and her insurance will not cover it. She is still taking the carbamazepine for her facial neuralgia that has slightly improved but not significantly yet. She would like a referral to get a neurologist within her network, which was ordered.   Cheryl Attwood, DO

## 2022-05-18 ENCOUNTER — Ambulatory Visit: Payer: Medicare Other | Admitting: Dermatology

## 2022-08-22 ENCOUNTER — Ambulatory Visit: Payer: Medicare Other | Admitting: Gastroenterology

## 2022-08-22 NOTE — Progress Notes (Deleted)
HPI :  last seen 01/2019 61 y/o female here for a follow up visit. I met her when she was hospitalized on 3/20 for 4 weeks worth of nausea / vomiting, poor PO intake associated with 30 lb weight loss. No pain. CT scan 3/6 had showed some gallstones but otherwise no pathology. Normal pancreas. She had an EGD by Dr. Lyndel Safe on 01/12/19 showing a normal esophagus which was dilated to 50Fr Maloney and biopsies taken to rule out EoE. Mild gastric erythema and duodenitis, biopsies taken. She was instructed to take Protonix 3m once daily and Zantac 1537m and resume Reglan PRN. Biopsies were positive for H pylori, she was given amoxicillin 1gm BID, clarithromycin 50025mID, flagyl 500m61mD, and protonix 40mg72m - she has 2 days left of the antibiotics. Instructions were given to complete the course, wait a few weeks and hold PPI during that time, and come back for a stool antigen test, which has not been done.   She reports so far she is doing okay, but will run out of protonix in 2 days. She is having some nausea a bit and using Zofran PRN which helps. Heartburn better controlled. She is not having any vomiting. No significant abdominal pains. She has been staying on soft diet. Still having some dysphagia, only mild improvement with dilation. She weighs 188 lbs, she is starting to regain weight. She has been using senna and prune juice / miralax PRN. She is having a BM every 1-3 days. She is not having any blood in the stools. Overall better but not back to her prior baseline.    She works at a company that [employer] does not feel ready to go back just yet, says she feels she is at "70%" of prior baseline. Remains fatigued   EGD 01/12/19 - normal esophagus, empiric dilation to 50 Fr Maloney, biopsies taken to rule out EoE, small hiatal hernia noted, mild gastric erythema and mild duodenitis in bulb - biopsies taken.    06/2012 EGD.  For hiccups and reflux symptoms.  Normal study.  5 cm hiatal  hernia. The symptoms subsided with Protonix. 9/13 colonoscopy.  Descending and sigmoid diverticulosis, mild.  2, diminutive, sessile polyps in the sigmoid.  Otherwise normal study.  Pathology from the polyps was that of polypoid colorectal mucosa.  10-year follow-up colonoscopy suggested.   12/28/2018 CT abdomen pelvis with contrast which showed uncomplicated tiny gallstones, normal stomach, liver, pancreas, small bowel.  Colonic diverticulosis noted.. 01/10/2019 esophagram shows decreased esophageal motility but no stricture or mass.  There is mild mucosal irregularity.  No masses.  Small hiatal hernia.  Barium tablet passed into the stomach with no delay.    57 y/44female here for a follow up visit to address the following:   H pylori gastritis / nausea with vomiting / weight loss / dysphagia / constipation - CT imaging negative, labs reassuring. EGD done and remarkable for H pylori gastritis. She has been treated as above and has had improvement in symptoms, eating much better, regaining some weight. She is due to finish her antibiotics in 2 days. About a month after finishing this, I would like her to submit an H pylori antigen test. In order to make this accurate, she will need to hold the protonix for 2 weeks prior to submitting that. She can use H2 blocker in the interim, recommend she stop Zantac given the FDA recall and use Pepcid 20mg 81m to twice daily. She can also continue Zofran  PRN. We discussed her dysphagia which is mild. Empiric dilation did not help much. 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 For her constipation, recommend Miralax daily to BID, she is taking it only PRN right now. She otherwise asks about disability paperwork, we will contact her and let her know where to submit this so we can fill out our portion. She is cleared to go back to work whenever she is ready to. She agreed. Due for  screening colonoscopy in 2023        Past Medical History:  Diagnosis Date   Allergy    uses Albuterol inhaler during allergy season   Dehydration 01/10/2019   Difficulty swallowing 01/10/2019   Diverticulosis 10/25/2011   Colonoscopy   Dysphagia 12/31/2018   GERD (gastroesophageal reflux disease)    Hiatal hernia    CT abdomen in August 2013   Hiccups 12/31/2018   Hx of colonic polyps 10/25/2011   Colonoscopy    Hypertension    Left hip pain 07/29/2019   LLQ abdominal pain 01/10/2019   Osteoarthritis    b/l hip, seen on CT abd in 05/2012     Past Surgical History:  Procedure Laterality Date   BIOPSY  01/12/2019   Procedure: BIOPSY;  Surgeon: Jackquline Denmark, MD;  Location: [place];  Service: Endoscopy;;   ESOPHAGOGASTRODUODENOSCOPY (EGD) WITH PROPOFOL N/A 01/12/2019   Procedure: ESOPHAGOGASTRODUODENOSCOPY (EGD) WITH PROPOFOL;  Surgeon: Jackquline Denmark, MD;  Location: [place];  Service: Endoscopy;  Laterality: N/A;   MALONEY DILATION  01/12/2019   Procedure: Venia Minks DILATION;  Surgeon: Jackquline Denmark, MD;  Location: [facility] ENDOSCOPY;  Service: Endoscopy;;   TONSILLECTOMY     childhood   TOTAL ABDOMINAL HYSTERECTOMY W/ BILATERAL SALPINGOOPHORECTOMY  2008   Fibroids, done by Dr. Clovia Cuff at [place] History  Problem Relation Age of Onset   Stroke Father        passed away at 21 yo of cerebal brain hemorrhage    Hypertension Mother    Hyperlipidemia Mother    Colon cancer Neg Hx    Stomach cancer Neg Hx    Colon polyps Neg Hx    Rectal cancer Neg Hx    Social History   Tobacco Use   Smoking status: Every Day    Packs/day: 0.50    Years: 31.00    Total pack years: 15.50    Types: Cigarettes   Smokeless tobacco: Never  Vaping Use   Vaping Use: Never used  Substance Use Topics   Alcohol use: Not Currently    Alcohol/week: 2.0 standard drinks of alcohol    Types: 2 Glasses of wine per week    Comment: 1-2 beers/week   Drug use: No   Current  Outpatient Medications  Medication Sig Dispense Refill   albuterol (VENTOLIN HFA) 108 (90 Base) MCG/ACT inhaler INHALE 2 PUFFS INTO THE LUNGS EVERY 6 (SIX) HOURS AS NEEDED. FOR SHORTNESS OF BREATH 18 g 2   amitriptyline (ELAVIL) 25 MG tablet Take 1 tablet (25 mg total) by mouth at bedtime. 30 tablet 0   aspirin 81 MG chewable tablet Chew 81 mg by mouth. Pt taking "some" days     carbamazepine (CARBATROL) 100 MG 12 hr capsule Take 1 capsule (100 mg total) by mouth 2 (two) times daily. 60 capsule 1   cetirizine (ZYRTEC ALLERGY) 10 MG tablet Take 1 tablet (10 mg total) by mouth daily. 30 tablet  11   famotidine (PEPCID) 20 MG tablet TAKE 1 TABLET BY MOUTH ONCE TO TWICE DAILY 60 tablet 4   fluticasone (FLONASE) 50 MCG/ACT nasal spray PLACE 2 SPRAYS INTO BOTH NOSTRILS DAILY. 16 g 6   hydrochlorothiazide (HYDRODIURIL) 25 MG tablet TAKE 1 TABLET (25 MG TOTAL) BY MOUTH DAILY. 30 tablet 3   ibuprofen (ADVIL) 600 MG tablet Take 600 mg by mouth every 6 (six) hours as needed.     meloxicam (MOBIC) 15 MG tablet Take 1 tablet (15 mg total) by mouth daily as needed for pain. Take with food. 30 tablet 0   Multiple Vitamin (MULTIVITAMIN WITH MINERALS) TABS tablet Take 1 tablet by mouth daily.     pantoprazole (PROTONIX) 40 MG tablet TAKE ONE TABLET BY MOUTH ONCE TO TWICE DAILY 60 tablet 4   polyethylene glycol (MIRALAX / GLYCOLAX) 17 g packet Take 17 g by mouth 2 (two) times daily as needed.  0   rosuvastatin (CRESTOR) 10 MG tablet Take 1 tablet (10 mg total) by mouth daily. 90 tablet 3   No current facility-administered medications for this visit.   No Known Allergies   Review of Systems: All systems reviewed and negative except where noted in HPI.    No results found.  Physical Exam: There were no vitals taken for this visit. Constitutional: Pleasant,well-developed, ***female in no acute distress. HEENT: Normocephalic and atraumatic. Conjunctivae are normal. No scleral icterus. Neck supple.   Cardiovascular: Normal rate, regular rhythm.  Pulmonary/chest: Effort normal and breath sounds normal. No wheezing, rales or rhonchi. Abdominal: Soft, nondistended, nontender. Bowel sounds active throughout. There are no masses palpable. No hepatomegaly. Extremities: no edema Lymphadenopathy: No cervical adenopathy noted. Neurological: Alert and oriented to person place and time. Skin: Skin is warm and dry. No rashes noted. Psychiatric: Normal mood and affect. Behavior is normal.   ASSESSMENT: 61 y.o. female here for assessment of the following  No diagnosis found.  PLAN:   Rise Patience, DO

## 2022-08-29 ENCOUNTER — Telehealth: Payer: Self-pay | Admitting: Family Medicine

## 2022-08-29 NOTE — Telephone Encounter (Signed)
Called to schedule AWV, if patient calls back let me know and I can help with scheduling, while patient is on the phone.   Thanks!

## 2022-09-30 ENCOUNTER — Ambulatory Visit: Payer: Medicare Other | Admitting: Family Medicine

## 2022-10-22 ENCOUNTER — Ambulatory Visit (HOSPITAL_COMMUNITY): Payer: Medicare Other

## 2022-10-27 ENCOUNTER — Ambulatory Visit (INDEPENDENT_AMBULATORY_CARE_PROVIDER_SITE_OTHER): Payer: Medicare Other | Admitting: Family Medicine

## 2022-10-27 VITALS — BP 131/83 | HR 76 | Wt 268.0 lb

## 2022-10-27 DIAGNOSIS — J0110 Acute frontal sinusitis, unspecified: Secondary | ICD-10-CM

## 2022-10-27 MED ORDER — DOXYCYCLINE HYCLATE 100 MG PO TABS: 100.0000 mg | ORAL_TABLET | Freq: Two times a day (BID) | ORAL | 0 refills | Status: DC

## 2022-10-27 NOTE — Progress Notes (Signed)
    SUBJECTIVE:   CHIEF COMPLAINT / HPI:   Congestion and ear pain - Present for 5 days - Sinus pressure, rhinorrhea, congestion, ear clogged feeling - Taking zyrtec, mucinex, and inhaler without improvement  - Affecting her ability to sleep due to the cough and the pressure in her face/ears - Has tenderness in her cheeks/eyes  PERTINENT  PMH / PSH: Reviewed   OBJECTIVE:   BP 131/83   Pulse 76   Wt 268 lb (121.6 kg)   SpO2 100%   BMI 38.45 kg/m   Gen: well-appearing, NAD CV: RRR, no m/r/g appreciated, no peripheral edema Pulm: CTAB, no wheezes/crackles GI: soft, non-tender, non-distended HEENT: b/l maxillary sinus tenderness to palpation, no pharyngeal erythema or tonsillar exudates, effusion present behind b/l TM, mild cervical lymphadenopathy  ASSESSMENT/PLAN:   Sinusitis Given duration of symptoms without improvement and lack of findings that would suggest ear infection, feel that treatment for mild sinusitis of the maxilla would be appropriate. Patient requests no amoxicillin as she has had issues with it not treating her infections in the past.  - doxy '100mg'$  BID x5 days - Patient to call if no improvement in 5 days and can extend course or have patient re-evaluated.  Rise Patience, [place]

## 2022-10-27 NOTE — Patient Instructions (Signed)
Given the duration of symptoms you are having and the lack of improvement, we will go ahead and do antibiotics to treat, like a sinus infection picture.  I am sending in a medication called doxycycline which she will take twice a day for the next 5 days.  If it 5 days you have no improvement then call me and let me know and we can extend the course of antibiotics or have you be seen in the office again.  I still recommend continuing the medications that you are using at home, especially the nasal spray.  If you are feeling a dryness in her throat you can also switch the Flonase to a nasal saline and see if that helps.

## 2022-12-20 ENCOUNTER — Telehealth: Payer: Self-pay | Admitting: Family Medicine

## 2022-12-20 NOTE — Telephone Encounter (Signed)
Contacted [name] to schedule their annual wellness visit. Appointment made for 12/27/2022.  Thank you,  [place] Direct dial  [phone]

## 2022-12-24 NOTE — Patient Instructions (Signed)

## 2022-12-24 NOTE — Progress Notes (Unsigned)
I connected with  [name] on 12/27/2022  by a audio enabled telemedicine application and verified that I am speaking with the correct person using two identifiers.  Patient Location: Home  Provider Location: Home Office  I discussed the limitations of evaluation and management by telemedicine. The patient expressed understanding and agreed to proceed.  Subjective:   [name] is a 62 y.o. female who presents for an Initial Medicare Annual Wellness Visit.  Review of Systems    Per HPI unless specifically indicated below. Cardiac Risk Factors include:  Hypertension, Chronic venous insuffciency, and hyperlipidemia.      Objective:       10/27/2022    2:52 PM 04/21/2022    1:25 PM 03/22/2022   10:27 AM  Vitals with BMI  Height  '5\' 10"'$  '5\' 10"'$   Weight 268 lbs 254 lbs 13 oz 269 lbs  BMI  [id number]  Systolic [id number]  Diastolic 83 69 80  Pulse 76 88 74    Today's Vitals   12/27/22 0828  PainSc: 8    There is no height or weight on file to calculate BMI.     12/27/2022    8:44 AM 10/27/2022    2:49 PM 04/21/2022    1:27 PM 03/22/2022   10:33 AM 10/23/2021    6:25 AM 06/18/2021    9:46 AM 07/07/2019    6:15 PM  Advanced Directives  Does Patient Have a Medical Advance Directive? No No No No No No No  Would patient like information on creating a medical advance directive? No - Patient declined No - Patient declined No - Patient declined No - Patient declined  No - Patient declined Yes (ED - Information included in AVS)    Current Medications (verified) Outpatient Encounter Medications as of 12/27/2022  Medication Sig   albuterol (VENTOLIN HFA) 108 (90 Base) MCG/ACT inhaler INHALE 2 PUFFS INTO THE LUNGS EVERY 6 (SIX) HOURS AS NEEDED. FOR SHORTNESS OF BREATH   aspirin 81 MG chewable tablet Chew 81 mg by mouth. Pt taking "some" days   cetirizine (ZYRTEC ALLERGY) 10 MG tablet Take 1 tablet (10 mg total) by mouth daily.   famotidine (PEPCID) 20 MG tablet TAKE 1 TABLET BY  MOUTH ONCE TO TWICE DAILY   hydrochlorothiazide (HYDRODIURIL) 25 MG tablet TAKE 1 TABLET (25 MG TOTAL) BY MOUTH DAILY.   ibuprofen (ADVIL) 600 MG tablet Take 600 mg by mouth every 6 (six) hours as needed.   meloxicam (MOBIC) 15 MG tablet Take 1 tablet (15 mg total) by mouth daily as needed for pain. Take with food.   Multiple Vitamin (MULTIVITAMIN WITH MINERALS) TABS tablet Take 1 tablet by mouth daily.   pantoprazole (PROTONIX) 40 MG tablet TAKE ONE TABLET BY MOUTH ONCE TO TWICE DAILY   polyethylene glycol (MIRALAX / GLYCOLAX) 17 g packet Take 17 g by mouth 2 (two) times daily as needed.   rosuvastatin (CRESTOR) 10 MG tablet Take 1 tablet (10 mg total) by mouth daily.   amitriptyline (ELAVIL) 25 MG tablet Take 1 tablet (25 mg total) by mouth at bedtime. (Patient not taking: Reported on 12/27/2022)   carbamazepine (CARBATROL) 100 MG 12 hr capsule Take 1 capsule (100 mg total) by mouth 2 (two) times daily. (Patient not taking: Reported on 12/27/2022)   fluticasone (FLONASE) 50 MCG/ACT nasal spray PLACE 2 SPRAYS INTO BOTH NOSTRILS DAILY.   [DISCONTINUED] doxycycline (VIBRA-TABS) 100 MG tablet Take 1 tablet (100 mg total) by mouth 2 (two)  times daily.   No facility-administered encounter medications on file as of 12/27/2022.    Allergies (verified) Patient has no known allergies.   History: Past Medical History:  Diagnosis Date   Allergy    uses Albuterol inhaler during allergy season   Dehydration 01/10/2019   Difficulty swallowing 01/10/2019   Diverticulosis 10/25/2011   Colonoscopy   Dysphagia 12/31/2018   GERD (gastroesophageal reflux disease)    Hiatal hernia    CT abdomen in August 2013   Hiccups 12/31/2018   Hx of colonic polyps 10/25/2011   Colonoscopy    Hypertension    Left hip pain 07/29/2019   LLQ abdominal pain 01/10/2019   Osteoarthritis    b/l hip, seen on CT abd in 05/2012   Past Surgical History:  Procedure Laterality Date   BIOPSY  01/12/2019   Procedure: BIOPSY;  Surgeon:  Jackquline Denmark, MD;  Location: [facility];  Service: Endoscopy;;   ESOPHAGOGASTRODUODENOSCOPY (EGD) WITH PROPOFOL N/A 01/12/2019   Procedure: ESOPHAGOGASTRODUODENOSCOPY (EGD) WITH PROPOFOL;  Surgeon: Jackquline Denmark, MD;  Location: [facility] ENDOSCOPY;  Service: Endoscopy;  Laterality: N/A;   MALONEY DILATION  01/12/2019   Procedure: Venia Minks DILATION;  Surgeon: Jackquline Denmark, MD;  Location: [facility] ENDOSCOPY;  Service: Endoscopy;;   TONSILLECTOMY     childhood   TOTAL ABDOMINAL HYSTERECTOMY W/ BILATERAL SALPINGOOPHORECTOMY  2008   Fibroids, done by Dr. Clovia Cuff at [place] History  Problem Relation Age of Onset   Stroke Father        passed away at 12 yo of cerebal brain hemorrhage    Hypertension Mother    Hyperlipidemia Mother    Colon cancer Neg Hx    Stomach cancer Neg Hx    Colon polyps Neg Hx    Rectal cancer Neg Hx    Social History   Socioeconomic History   Marital status: Single    Spouse name: Not on file   Number of children: 3   Years of education: 12th   Highest education level: Not on file  Occupational History   Occupation: Retired  Tobacco Use   Smoking status: Every Day    Packs/day: 0.50    Years: 31.00    Total pack years: 15.50    Types: Cigarettes   Smokeless tobacco: Never  Vaping Use   Vaping Use: Never used  Substance and Sexual Activity   Alcohol use: Not Currently    Alcohol/week: 2.0 standard drinks of alcohol    Types: 2 Glasses of wine per week    Comment: 1-2 beers/week   Drug use: No   Sexual activity: Yes    Partners: Male    Comment: Same partner for 11 years  Other Topics Concern   Not on file  Social History Narrative   Live with her mother   Has three kids - all grown - all in [place] area   4 grandchildren   [place] at home - 11 years   Social Determinants of Health   Financial Resource Strain: Low Risk  (12/27/2022)   Overall Financial Resource Strain (CARDIA)    Difficulty of Paying Living Expenses: Not hard  at all  Food Insecurity: No Food Insecurity (12/27/2022)   Hunger Vital Sign    Worried About Running Out of Food in the Last Year: Never true    Ran Out of Food in the Last Year: Never true  Transportation Needs: No Transportation Needs (12/27/2022)   PRAPARE - Transportation    Lack of  Transportation (Medical): No    Lack of Transportation (Non-Medical): No  Physical Activity: Insufficiently Active (12/27/2022)   Exercise Vital Sign    Days of Exercise per Week: 3 days    Minutes of Exercise per Session: 30 min  Stress: No Stress Concern Present (12/27/2022)   [place]    Feeling of Stress : Only a little  Social Connections: Socially Integrated (12/27/2022)   Social Connection and Isolation Panel [NHANES]    Frequency of Communication with Friends and Family: More than three times a week    Frequency of Social Gatherings with Friends and Family: More than three times a week    Attends Religious Services: More than 4 times per year    Active Member of [employer] or Organizations: Yes    Attends Music therapist: More than 4 times per year    Marital Status: Living with partner    Tobacco Counseling Ready to quit: Not Answered Counseling given: No   Clinical Intake:  Pre-visit preparation completed: No  Pain : 0-10 Pain Score: 8  Pain Type: Chronic pain Pain Location: Penis (blateral knee, hip) Pain Orientation: Right, Left Pain Descriptors / Indicators: Aching, Throbbing Pain Onset: More than a month ago Pain Frequency: Intermittent     Nutritional Status: BMI > 30  Obese Nutritional Risks: None Diabetes: No  How often do you need to have someone help you when you read instructions, pamphlets, or other written materials from your doctor or pharmacy?: 1 - Never  Diabetic?No  Interpreter Needed?: No  Information entered by :: Donnie Mesa, CMA   Activities of Daily Living    12/27/2022     8:22 AM  In your present state of health, do you have any difficulty performing the following activities:  Hearing? 0  Vision? 1  Difficulty concentrating or making decisions? 0  Walking or climbing stairs? 1  Dressing or bathing? 0  Doing errands, shopping? 0    Patient Care Team: Rise Patience, DO as PCP - General (Family Medicine)  Indicate any recent Medical Services you may have received from other than [facility] providers in the past year (date may be approximate).     Assessment:   This is a routine wellness examination for [place].  Hearing/Vision screen Denies any hearing issues. Denies any vision changes. Annual Eye Exam Dr. Gershon Crane  Dietary issues and exercise activities discussed:     Goals Addressed   None    Depression Screen    12/27/2022    8:21 AM 10/27/2022    2:54 PM 04/21/2022    1:26 PM 03/22/2022   10:33 AM 02/18/2021    9:34 AM 09/02/2020    3:01 PM 08/14/2019   11:24 AM  PHQ 2/9 Scores  PHQ - 2 Score 0 0 0 0 0 0 0  PHQ- 9 Score  0 0 0   0    Fall Risk    12/27/2022    8:22 AM 09/02/2020    3:01 PM 08/14/2019   11:24 AM 11/13/2012   10:26 AM  Fall Risk   Falls in the past year? 0 0 0 No  Number falls in past yr: 0 0    Injury with Fall? 0 0    Risk for fall due to : No Fall Risks     Follow up Falls evaluation completed       [place]:  Any stairs in or around  the home? No  If so, are there any without handrails? No  Home free of loose throw rugs in walkways, pet beds, electrical cords, etc? Yes  Adequate lighting in your home to reduce risk of falls? Yes   ASSISTIVE DEVICES UTILIZED TO PREVENT FALLS:  Life alert? No  Use of a cane, walker or w/c? Yes  Grab bars in the bathroom? No  Shower chair or bench in shower? Yes  Elevated toilet seat or a handicapped toilet? Yes   TIMED UP AND GO:  Was the test performed? Unable to perform, virtual appointment   Cognitive Function:        12/27/2022     8:26 AM  6CIT Screen  What Year? 0 points  What month? 0 points  What time? 0 points  Count back from 20 0 points  Months in reverse 0 points  Repeat phrase 0 points  Total Score 0 points    Immunizations Immunization History  Administered Date(s) Administered   Influenza,inj,Quad PF,6+ Mos 06/21/2019   PFIZER Comirnaty(Gray Top)Covid-19 Tri-Sucrose Vaccine 02/09/2020, 03/01/2020, 09/08/2020, 04/09/2021   Tdap 06/15/2012    TDAP status: Due, Education has been provided regarding the importance of this vaccine. Advised may receive this vaccine at local pharmacy or Health Dept. Aware to provide a copy of the vaccination record if obtained from local pharmacy or Health Dept. Verbalized acceptance and understanding.  Flu Vaccine status: Due, Education has been provided regarding the importance of this vaccine. Advised may receive this vaccine at local pharmacy or Health Dept. Aware to provide a copy of the vaccination record if obtained from local pharmacy or Health Dept. Verbalized acceptance and understanding.  Pneumococcal vaccine status: Due, Education has been provided regarding the importance of this vaccine. Advised may receive this vaccine at local pharmacy or Health Dept. Aware to provide a copy of the vaccination record if obtained from local pharmacy or Health Dept. Verbalized acceptance and understanding.  Covid-19 vaccine status: Information provided on how to obtain vaccines.   Qualifies for Shingles Vaccine? Yes   Zostavax completed No   Shingrix Completed?: No.    Education has been provided regarding the importance of this vaccine. Patient has been advised to call insurance company to determine out of pocket expense if they have not yet received this vaccine. Advised may also receive vaccine at local pharmacy or Health Dept. Verbalized acceptance and understanding.  Screening Tests Health Maintenance  Topic Date Due   Zoster Vaccines- Shingrix (1 of 2) Never done    MAMMOGRAM  05/24/2012   INFLUENZA VACCINE  05/24/2022   DTaP/Tdap/Td (2 - Td or Tdap) 06/15/2022   COVID-19 Vaccine (5 - 2023-24 season) 06/24/2022   COLONOSCOPY (Pts 45-71yr Insurance coverage will need to be confirmed)  07/18/2022   Medicare Annual Wellness (AWV)  12/27/2023   Hepatitis C Screening  Completed   HIV Screening  Completed   HPV VACCINES  Aged Out   PAP SMEAR-Modifier  Discontinued    Health Maintenance  Health Maintenance Due  Topic Date Due   Zoster Vaccines- Shingrix (1 of 2) Never done   MAMMOGRAM  05/24/2012   INFLUENZA VACCINE  05/24/2022   DTaP/Tdap/Td (2 - Td or Tdap) 06/15/2022   COVID-19 Vaccine (5 - 2023-24 season) 06/24/2022   COLONOSCOPY (Pts 45-492yrInsurance coverage will need to be confirmed)  07/18/2022    Colorectal cancer screening: Referral to GI placed 12/27/2022. Pt aware the office will call re: appt.  Mammogram status: Ordered 12/27/2022. Pt provided with  contact info and advised to call to schedule appt.   DEXA Scan:  Lung Cancer Screening: (Low Dose CT Chest recommended if Age 61-80 years, 30 pack-year currently smoking OR have quit w/in 15years.) does not qualify.   Lung Cancer Screening Referral: not applicabl   Additional Screening:  Hepatitis C Screening: does qualify; Completed 03/22/2022  Vision Screening: Recommended annual ophthalmology exams for early detection of glaucoma and other disorders of the eye. Is the patient up to date with their annual eye exam?  Yes  Who is the provider or what is the name of the office in which the patient attends annual eye exams? Dr. Gershon Crane If pt is not established with a provider, would they like to be referred to a provider to establish care? No .   Dental Screening: Recommended annual dental exams for proper oral hygiene  Community Resource Referral / Chronic Care Management: CRR required this visit?  No   CCM required this visit?  No      Plan:     I have personally  reviewed and noted the following in the patient's chart:   Medical and social history Use of alcohol, tobacco or illicit drugs  Current medications and supplements including opioid prescriptions. Patient is not currently taking opioid prescriptions. Functional ability and status Nutritional status Physical activity Advanced directives List of other physicians Hospitalizations, surgeries, and ER visits in previous 12 months Vitals Screenings to include cognitive, depression, and falls Referrals and appointments  In addition, I have reviewed and discussed with patient certain preventive protocols, quality metrics, and best practice recommendations. A written personalized care plan for preventive services as well as general preventive health recommendations were provided to patient.    Ms. Lindt , Thank you for taking time to come for your Medicare Wellness Visit. I appreciate your ongoing commitment to your health goals. Please review the following plan we discussed and let me know if I can assist you in the future.   These are the goals we discussed:  Goals      LDL CALC < 160        This is a list of the screening recommended for you and due dates:  Health Maintenance  Topic Date Due   Zoster (Shingles) Vaccine (1 of 2) Never done   Mammogram  05/24/2012   Flu Shot  05/24/2022   DTaP/Tdap/Td vaccine (2 - Td or Tdap) 06/15/2022   COVID-19 Vaccine (5 - 2023-24 season) 06/24/2022   Colon Cancer Screening  07/18/2022   Medicare Annual Wellness Visit  12/27/2023   Hepatitis C Screening: USPSTF Recommendation to screen - Ages 18-79 yo.  Completed   HIV Screening  Completed   HPV Vaccine  Aged Out   Pap Smear  Discontinued     Wilson Singer, [facility]  12/27/2022  Nurse Notes:Approximately 30 minute Non-Face -To-Face Medicare Wellness Visit

## 2022-12-26 ENCOUNTER — Telehealth (HOSPITAL_BASED_OUTPATIENT_CLINIC_OR_DEPARTMENT_OTHER): Payer: Self-pay | Admitting: Family Medicine

## 2022-12-26 NOTE — Telephone Encounter (Signed)
I left a message on patient's voice mail to confirm AWV on 12/27/2022 at 8:30.

## 2022-12-27 ENCOUNTER — Ambulatory Visit (INDEPENDENT_AMBULATORY_CARE_PROVIDER_SITE_OTHER): Payer: Medicare Other

## 2022-12-27 DIAGNOSIS — Z1231 Encounter for screening mammogram for malignant neoplasm of breast: Secondary | ICD-10-CM

## 2022-12-27 DIAGNOSIS — Z0000 Encounter for general adult medical examination without abnormal findings: Secondary | ICD-10-CM | POA: Diagnosis not present

## 2022-12-27 DIAGNOSIS — Z1211 Encounter for screening for malignant neoplasm of colon: Secondary | ICD-10-CM | POA: Diagnosis not present

## 2023-03-27 ENCOUNTER — Telehealth: Payer: Self-pay

## 2023-03-27 ENCOUNTER — Ambulatory Visit (INDEPENDENT_AMBULATORY_CARE_PROVIDER_SITE_OTHER): Payer: Medicare Other | Admitting: Family Medicine

## 2023-03-27 ENCOUNTER — Encounter: Payer: Self-pay | Admitting: Family Medicine

## 2023-03-27 VITALS — BP 140/60 | HR 83 | Ht 70.0 in | Wt 265.8 lb

## 2023-03-27 DIAGNOSIS — J309 Allergic rhinitis, unspecified: Secondary | ICD-10-CM | POA: Diagnosis not present

## 2023-03-27 DIAGNOSIS — R7303 Prediabetes: Secondary | ICD-10-CM | POA: Diagnosis not present

## 2023-03-27 DIAGNOSIS — E6609 Other obesity due to excess calories: Secondary | ICD-10-CM

## 2023-03-27 DIAGNOSIS — R030 Elevated blood-pressure reading, without diagnosis of hypertension: Secondary | ICD-10-CM

## 2023-03-27 DIAGNOSIS — I10 Essential (primary) hypertension: Secondary | ICD-10-CM

## 2023-03-27 DIAGNOSIS — R229 Localized swelling, mass and lump, unspecified: Secondary | ICD-10-CM | POA: Diagnosis not present

## 2023-03-27 DIAGNOSIS — Z6838 Body mass index (BMI) 38.0-38.9, adult: Secondary | ICD-10-CM

## 2023-03-27 DIAGNOSIS — J9801 Acute bronchospasm: Secondary | ICD-10-CM | POA: Diagnosis not present

## 2023-03-27 DIAGNOSIS — K2100 Gastro-esophageal reflux disease with esophagitis, without bleeding: Secondary | ICD-10-CM

## 2023-03-27 DIAGNOSIS — I83893 Varicose veins of bilateral lower extremities with other complications: Secondary | ICD-10-CM

## 2023-03-27 LAB — POCT GLYCOSYLATED HEMOGLOBIN (HGB A1C): Hemoglobin A1C: 5.8 % — AB (ref 4.0–5.6)

## 2023-03-27 MED ORDER — SEMAGLUTIDE(0.25 OR 0.5MG/DOS) 2 MG/3ML ~~LOC~~ SOPN
0.2500 mg | PEN_INJECTOR | SUBCUTANEOUS | 0 refills | Status: DC
Start: 2023-03-27 — End: 2024-05-31

## 2023-03-27 MED ORDER — CETIRIZINE HCL 10 MG PO TABS: 10.0000 mg | ORAL_TABLET | Freq: Every day | ORAL | 11 refills | Status: DC

## 2023-03-27 MED ORDER — HYDROCHLOROTHIAZIDE 25 MG PO TABS: ORAL_TABLET | Freq: Every day | ORAL | 3 refills | Status: DC

## 2023-03-27 MED ORDER — FAMOTIDINE 20 MG PO TABS: ORAL_TABLET | ORAL | 4 refills | Status: DC

## 2023-03-27 MED ORDER — PANTOPRAZOLE SODIUM 40 MG PO TBEC: DELAYED_RELEASE_TABLET | ORAL | 4 refills | Status: DC

## 2023-03-27 MED ORDER — ROSUVASTATIN CALCIUM 10 MG PO TABS: 10.0000 mg | ORAL_TABLET | Freq: Every day | ORAL | 3 refills | Status: DC

## 2023-03-27 MED ORDER — ALBUTEROL SULFATE HFA 108 (90 BASE) MCG/ACT IN AERS: INHALATION_SPRAY | RESPIRATORY_TRACT | 2 refills | Status: DC

## 2023-03-27 MED ORDER — ASPIRIN 81 MG PO CHEW: 81.0000 mg | CHEWABLE_TABLET | Freq: Every day | ORAL | 1 refills | Status: AC

## 2023-03-27 NOTE — Telephone Encounter (Signed)
Rec'd PA request for patients medication ozempic.   Dx (prediabetes) is not covered for this medication.

## 2023-03-27 NOTE — Progress Notes (Signed)
    SUBJECTIVE:   Chief compliant/HPI: annual examination  [name] is a 62 y.o. who presents today for an annual exam.   Review of systems form notable for arthritic pain and weight gain.   Also notes that she takes her mother to wound care for 2 hours every day for her hyperbaric treatment. She would like assistance with her weight gain as she knows this will also help her arthritic pains.   Updated history tabs and problem list.   OBJECTIVE:   BP (!) 140/60   Pulse 83   Ht 5\' 10"  (1.778 m)   Wt 265 lb 12.8 oz (120.6 kg)   SpO2 99%   BMI 38.14 kg/m   Gen: well-appearing, NAD CV: RRR, no m/r/g appreciated, no peripheral edema Pulm: CTAB, no wheezes/crackles Skin: nodule of the face to the latero-inferior region of the right orbital region that is mobile and tender to palpation without erythema present  ASSESSMENT/PLAN:   Prediabetes A1c stable at 5.8, patient with BMI of 38.14. Discussed exercise habit changes that can help with arthritic pains as well.  - Will try to see if ozempic will be covered - Discussed importance of diet and exercise changes - Goal for patient to use the 2 hour window of her mother's treatments to go walk in the park/exercise  - Lipid panel today - Rosuvastatin refilled  Hypertension BP slightly elevated with diastolic pressure on the lower side but well tolerated. Currently on HCTZ. - BMP today - Continue HCTZ - Check BP at home and call if BP consistently >140/90   Facial Nodule Unclear what is present at the latero-inferior region but appears to be a possible cyst. Patient discussed with ophthalmology but they recommended removal with dermatology.  - Dermatology referral placed    Annual Examination  See AVS for age appropriate recommendations.   PHQ score 0, reviewed and discussed. Blood pressure reviewed and slightly above goal, has BP cuff at home to check pressures.  Asked about intimate partner violence and patient reports  none.   Considered the following items based upon USPSTF recommendations: Syphilis if at high risk:  not high risk GC/CT not at high risk and not ordered. Lipid panel (nonfasting or fasting) discussed based upon AHA recommendations and ordered.  Consider repeat every 4-6 years.  Reviewed risk factors for latent tuberculosis and not indicated  Discussed family history, BRCA testing not indicated. Tool used to risk stratify was Pedigree Assessment tool   Mammogram discussed, patient to call breast center Colonoscopy discussed, patient has contact information for GI  Follow up in 3 months for blood pressure    Ji Fairburn, DO [facility]

## 2023-03-27 NOTE — Assessment & Plan Note (Signed)
BP slightly elevated with diastolic pressure on the lower side but well tolerated. Currently on HCTZ. - BMP today - Continue HCTZ - Check BP at home and call if BP consistently >140/90

## 2023-03-27 NOTE — Assessment & Plan Note (Signed)
A1c stable at 5.8, patient with BMI of 38.14. Discussed exercise habit changes that can help with arthritic pains as well.  - Will try to see if ozempic will be covered - Discussed importance of diet and exercise changes - Goal for patient to use the 2 hour window of her mother's treatments to go walk in the park/exercise  - Lipid panel today - Rosuvastatin refilled

## 2023-03-27 NOTE — Patient Instructions (Addendum)
Your A1c today was 5.8, which is still in the prediabetic range. I have sent in a request to try and get approved for Ozempic but I can't guarantee that insurance will cover it.   I would recommend trying to incorporate exercise during the times your mother is at wound care by either walking in the park and adding in exercises (such as doing sitting squats at every park bench, or lunges every 5 minutes) to help with keeping the joints moving and help with weight loss. If you are not approved for the ozempic, I would recommend referring you to medical weight management to help with further.   Today we are going to check your cholesterol and kidneys.   I placed the referral to dermatology.  Please make sure to call the breast center for your mammogram and make sure to schedule your colonoscopy.  Make sure to keep an eye on your blood pressures at home and make sure to call if they are staying >140/90

## 2023-03-28 ENCOUNTER — Telehealth: Payer: Self-pay | Admitting: Family Medicine

## 2023-03-28 ENCOUNTER — Other Ambulatory Visit (HOSPITAL_COMMUNITY): Payer: Self-pay

## 2023-03-28 DIAGNOSIS — E781 Pure hyperglyceridemia: Secondary | ICD-10-CM

## 2023-03-28 LAB — BASIC METABOLIC PANEL
BUN/Creatinine Ratio: 21 (ref 12–28)
BUN: 12 mg/dL (ref 8–27)
CO2: 23 mmol/L (ref 20–29)
Calcium: 9.5 mg/dL (ref 8.7–10.3)
Chloride: 104 mmol/L (ref 96–106)
Creatinine, Ser: 0.56 mg/dL — ABNORMAL LOW (ref 0.57–1.00)
Glucose: 93 mg/dL (ref 70–99)
Potassium: 4 mmol/L (ref 3.5–5.2)
Sodium: 141 mmol/L (ref 134–144)
eGFR: 104 mL/min/{1.73_m2} (ref >59)

## 2023-03-28 LAB — LIPID PANEL
Chol/HDL Ratio: 8.6 ratio — ABNORMAL HIGH (ref 0.0–4.4)
Cholesterol, Total: 268 mg/dL — ABNORMAL HIGH (ref 100–199)
HDL: 31 mg/dL — ABNORMAL LOW (ref >39)
LDL Chol Calc (NIH): 134 mg/dL — ABNORMAL HIGH (ref 0–99)
Triglycerides: 558 mg/dL (ref 0–149)
VLDL Cholesterol Cal: 103 mg/dL — ABNORMAL HIGH (ref 5–40)

## 2023-03-28 MED ORDER — VASCEPA 1 G PO CAPS
2.0000 g | ORAL_CAPSULE | Freq: Two times a day (BID) | ORAL | 1 refills | Status: DC
Start: 2023-03-28 — End: 2024-05-31

## 2023-03-28 NOTE — Telephone Encounter (Signed)
Called patient to discuss lab results. Appears that she has significant and persistent hypertriglyceridemia. Given high ASCVD risk of 28.9%, we will proceed with icosapent ethyl. If patient continues to have elevated TG levels, will need consider addition of fibrate therapy. Patient voiced understanding for prescription of Vascepa, which should be covered under brand name with her insurance.   Randilyn Foisy, DO

## 2023-04-20 ENCOUNTER — Other Ambulatory Visit (HOSPITAL_COMMUNITY): Payer: Self-pay

## 2023-06-01 IMAGING — CR DG FOOT COMPLETE 3+V*L*
3 series · 3 of 3 positions shown · non-contrast
Comparison: None

CLINICAL DATA: Sudden onset of pain at fifth metatarsal head, no
known injury

EXAM:
LEFT FOOT - COMPLETE 3+ VIEW

[x foot ap left]
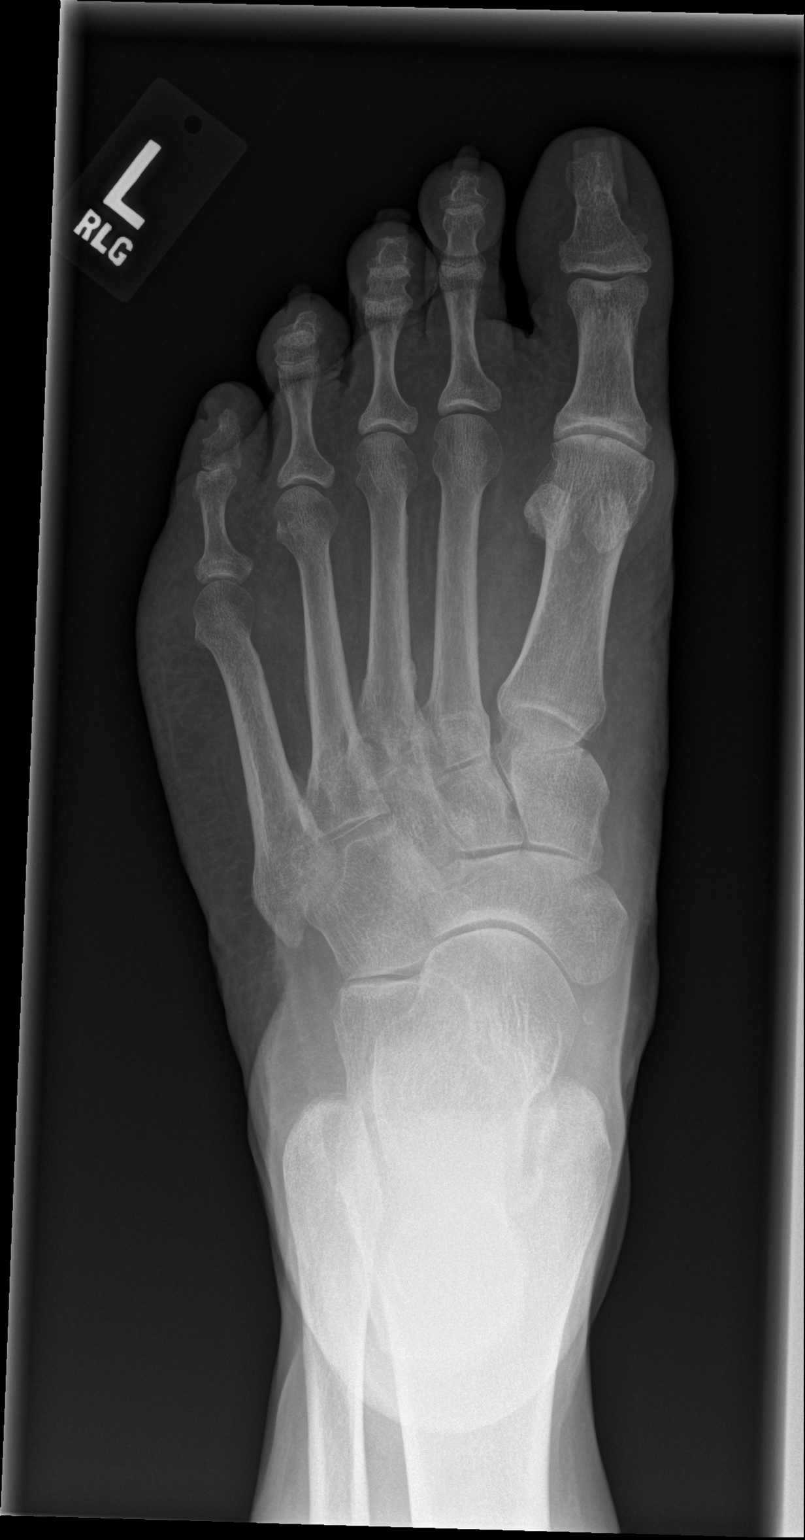

[x foot obl left]
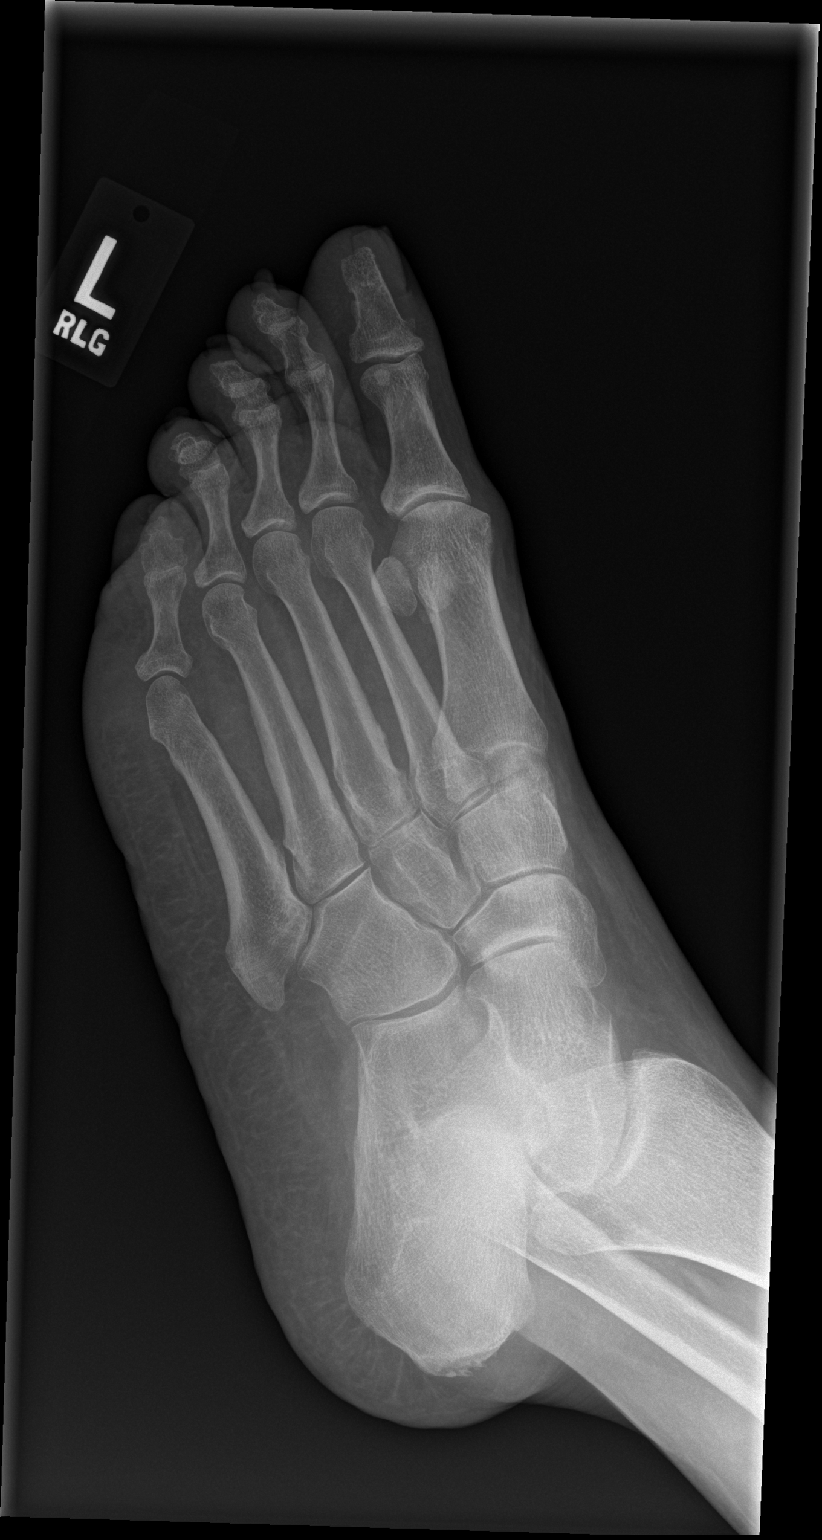

[x foot lat left]
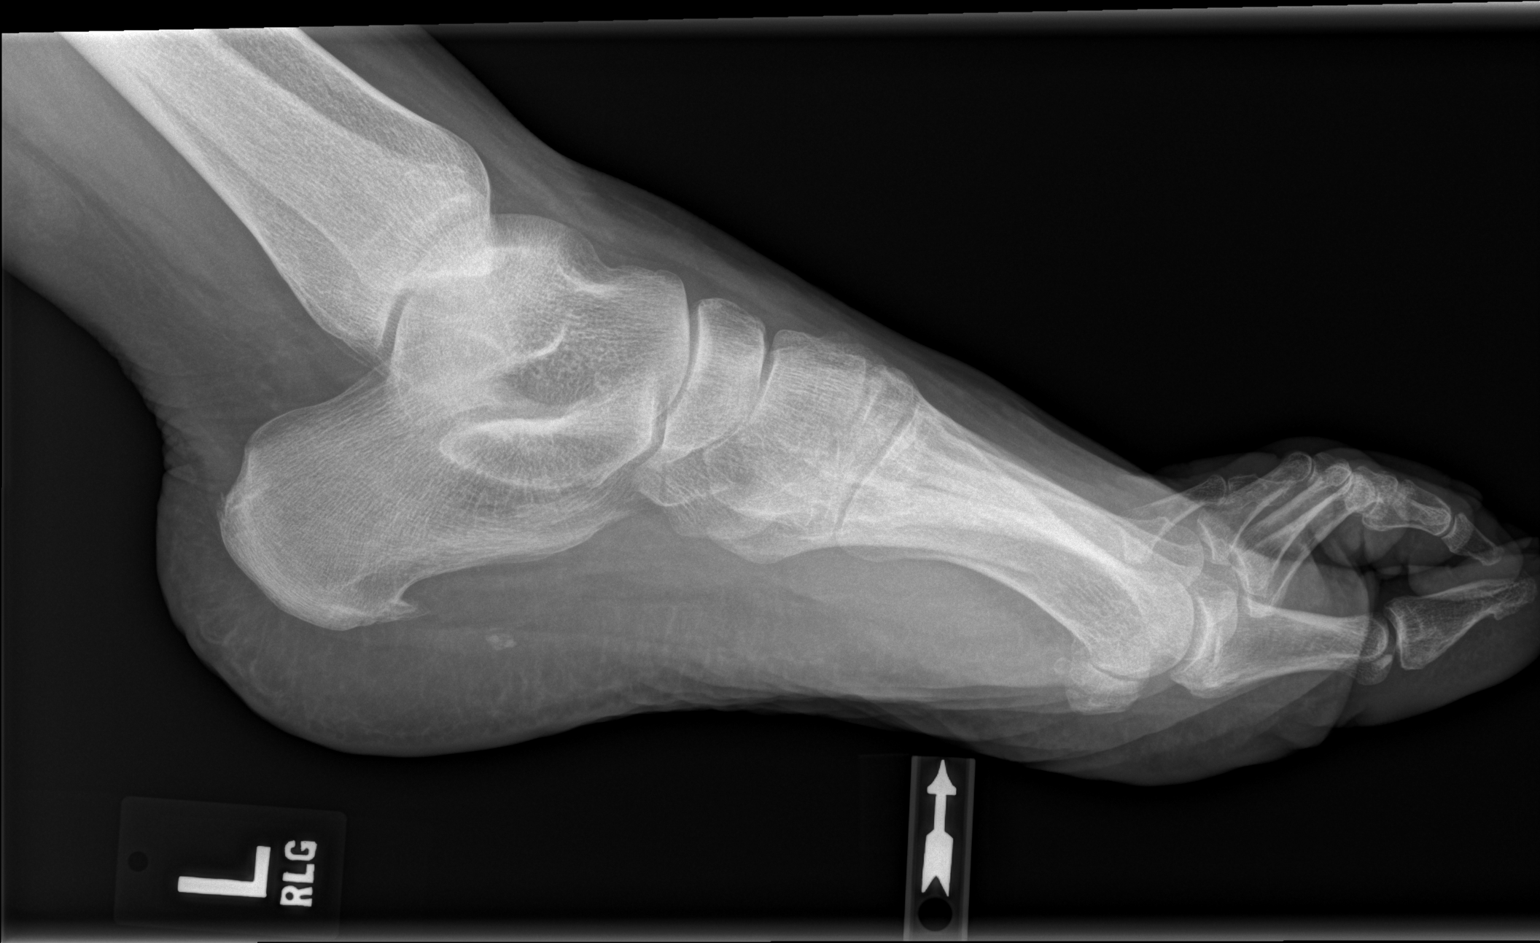

[3 of 3 positions shown; findings below may reference images not displayed]

FINDINGS: Osseous demineralization.

Joint spaces preserved.

Soft tissue swelling lateral to the fifth MTP joint.

No acute fracture, dislocation, or bone destruction.

Small plantar calcaneal spur.
IMPRESSION: No acute osseous abnormalities.

Soft tissue swelling lateral to the fifth MTP joint.

## 2023-06-27 ENCOUNTER — Ambulatory Visit: Payer: Medicare Other | Admitting: Sports Medicine

## 2023-07-04 ENCOUNTER — Ambulatory Visit (INDEPENDENT_AMBULATORY_CARE_PROVIDER_SITE_OTHER): Payer: Medicare Other | Admitting: Sports Medicine

## 2023-07-04 VITALS — BP 134/86 | Ht 70.5 in | Wt 220.0 lb

## 2023-07-04 DIAGNOSIS — M1712 Unilateral primary osteoarthritis, left knee: Secondary | ICD-10-CM | POA: Diagnosis not present

## 2023-07-04 MED ORDER — METHYLPREDNISOLONE ACETATE 40 MG/ML IJ SUSP
40.0000 mg | Freq: Once | INTRAMUSCULAR | Status: AC
  Administered 2023-07-04: 40 mg via INTRA_ARTICULAR

## 2023-07-04 NOTE — Progress Notes (Signed)
   Subjective:    Patient ID: [name], female    DOB: 1961-07-05, 62 y.o.   MRN: [id number]  HPI chief complaint: Left knee pain  Patient presents today requesting a cortisone injection in the left knee.  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  Last cortisone injection was administered in March 2023.  Pain is now returned.    Review of Systems As above    Objective:   Physical Exam  Well-developed, well-nourished.  No acute distress  Left knee: Range of motion 0 to 100 degrees.  Trace effusion.  Good stability.  No joint line tenderness.      Assessment & Plan:   Returning left knee pain secondary to DJD  Left knee is injected today with cortisone utilizing anterior lateral approach.  She tolerates this without difficulty.  She may decide to return in the near future for either a right knee cortisone injection or left hip ultrasound-guided intra-articular injection.  Follow-up as needed.  Consent obtained and verified. Time-out conducted. Noted no overlying erythema, induration, or other signs of local infection. Skin prepped in a sterile fashion. Topical analgesic spray: Ethyl chloride. Joint: Left knee  Needle: 25-gauge 1.5 inch Completed without difficulty. Meds: 3 cc 1% Xylocaine, 1 cc (40 mg) Depo-Medrol  This note was dictated using Dragon naturally speaking software and may contain errors in syntax, spelling, or content which have not been identified prior to signing this note.

## 2023-08-04 ENCOUNTER — Encounter: Payer: Self-pay | Admitting: Gastroenterology

## 2023-10-10 ENCOUNTER — Ambulatory Visit (INDEPENDENT_AMBULATORY_CARE_PROVIDER_SITE_OTHER): Payer: 59 | Admitting: Student

## 2023-10-10 VITALS — BP 104/61 | HR 76 | Temp 99.1°F

## 2023-10-10 DIAGNOSIS — J069 Acute upper respiratory infection, unspecified: Secondary | ICD-10-CM | POA: Insufficient documentation

## 2023-10-10 DIAGNOSIS — Z720 Tobacco use: Secondary | ICD-10-CM | POA: Diagnosis not present

## 2023-10-10 MED ORDER — BENZONATATE 200 MG PO CAPS: 200.0000 mg | ORAL_CAPSULE | Freq: Two times a day (BID) | ORAL | 0 refills | Status: DC | PRN

## 2023-10-10 NOTE — Patient Instructions (Addendum)
1. Timeline for the common cold: Symptoms typically peak at 2-3 days of illness and then gradually improve over 10-14 days. However, a cough may last 2-4 weeks.   2. Drink plenty of fluids  3. You may take Ibuprofen (Advil or Motrin) or Tylenol  every 6-8 hours.   5. Take the medication I sent to the pharmacy and honey to help with your cough.  Have a wonderful day,  Dr. Darral Dash [facility] [phone]

## 2023-10-10 NOTE — Progress Notes (Signed)
    SUBJECTIVE:   CHIEF COMPLAINT / HPI:   [name] is a 62 year-old female here with cold symptoms.  Started with a tickle in the back of her throat about 3 days ago. Using salt water gargle.  She says she is also having chest congestion, sinus congestion with clear discharge. No vomiting, diarrhea. Says that her ear ears feel "full of water."  She has been taking Mucinex daily to help with her congestion. She had a recent sick contact around her aunt.  She smokes- 1/4 PPD. No history of known COPD.  PERTINENT  PMH / PSH:  Tobacco use Hypertension GERD History of asthma  OBJECTIVE:   BP 104/61   Pulse 76   Temp 99.1 F (37.3 C)   SpO2 94%  General: Pleasant, chronically ill-appearing, no distress HEENT: Edentulous, mucous membranes moist.  TMs visualized bilaterally, she does have some erythema of the right TM with fluid but no bulging or purulence. Cardiac: Regular rate and rhythm Respiratory: Speaks in full sentences.  Normal work of breathing on room air with no wheezing or crackles on auscultation.  No diminished lung sounds. Skin: Warm and dry  ASSESSMENT/PLAN:   Tobacco use Encouraged cessation. Will discuss at further visits, would benefit from visit with Dr. Raymondo Band  Viral URI with cough 3 days of cough, sinus congestion. Likely viral in nature.  No concern for pneumonia, acute bacterial infection, AOM. Although she does not have a official diagnosis of COPD, but she does have longstanding tobacco use.  I am not worried at this time that this is an exacerbation given lack of purulence, dyspnea. Reassuringly, generally well-appearing with SpO2 94% on room air and no signs of labored breathing. Plan for supportive care at this time with rest, fluids, honey or Tessalon Perles for cough. COVID test was obtained today, if positive will prescribe Paxlovid given high risk patient. Return precautions discussed     Darral Dash, DO [facility]

## 2023-10-10 NOTE — Assessment & Plan Note (Signed)
Encouraged cessation. Will discuss at further visits, would benefit from visit with Dr. Raymondo Band

## 2023-10-10 NOTE — Assessment & Plan Note (Signed)
3 days of cough, sinus congestion. Likely viral in nature.  No concern for pneumonia, acute bacterial infection, AOM. Although she does not have a official diagnosis of COPD, but she does have longstanding tobacco use.  I am not worried at this time that this is an exacerbation given lack of purulence, dyspnea. Reassuringly, generally well-appearing with SpO2 94% on room air and no signs of labored breathing. Plan for supportive care at this time with rest, fluids, honey or Tessalon Perles for cough. COVID test was obtained today, if positive will prescribe Paxlovid given high risk patient. Return precautions discussed

## 2023-10-11 NOTE — Addendum Note (Signed)
Addended by: Darral Dash on: 10/11/2023 08:32 AM   Modules accepted: Orders

## 2023-10-12 ENCOUNTER — Telehealth: Payer: Self-pay

## 2023-10-12 NOTE — Telephone Encounter (Signed)
Patient LVM on nurse line requesting results from COVID testing.   Returned call to patient. Verified name and DOB.   Advised patient that COVID results are still pending and that we will contact her once they are available.   Patient appreciative.   Veronda Prude, RN

## 2023-10-13 LAB — NOVEL CORONAVIRUS, NAA: SARS-CoV-2, NAA: NOT DETECTED

## 2023-11-08 ENCOUNTER — Encounter: Payer: Medicare Other | Admitting: Gastroenterology

## 2023-11-30 ENCOUNTER — Ambulatory Visit: Payer: 59 | Admitting: Dermatology

## 2023-11-30 ENCOUNTER — Encounter: Payer: Self-pay | Admitting: Dermatology

## 2023-11-30 VITALS — BP 105/71 | HR 90

## 2023-11-30 DIAGNOSIS — L729 Follicular cyst of the skin and subcutaneous tissue, unspecified: Secondary | ICD-10-CM | POA: Diagnosis not present

## 2023-11-30 DIAGNOSIS — B079 Viral wart, unspecified: Secondary | ICD-10-CM

## 2023-11-30 DIAGNOSIS — L700 Acne vulgaris: Secondary | ICD-10-CM | POA: Diagnosis not present

## 2023-11-30 DIAGNOSIS — L709 Acne, unspecified: Secondary | ICD-10-CM

## 2023-11-30 MED ORDER — TRETINOIN 0.025 % EX CREA
TOPICAL_CREAM | Freq: Every day | CUTANEOUS | 2 refills | Status: DC
Start: 2023-11-30 — End: 2024-07-11

## 2023-11-30 NOTE — Progress Notes (Signed)
   New Patient Visit   Subjective  [name] is a 63 y.o. female who presents for the following: Spot Check and hyperpigmentation  Patient states she has hyperpigmentation and spot check located at the face that she would like to have examined. Patient reports the areas have been there for 6 months to a year. She reports the areas are bothersome.Patient rates irritation 5 out of 10. She states that the areas have not spread. Patient reports she has previously been treated for these areas (Previous Pt of Dr. Livingston). Patient denies Hx of bx. Patient denies family history of skin cancer(s).  The following portions of the chart were reviewed this encounter and updated as appropriate: medications, allergies, medical history  Review of Systems:  No other skin or systemic complaints except as noted in HPI or Assessment and Plan.  Objective  Well appearing patient in no apparent distress; mood and affect are within normal limits.   A focused examination was performed of the following areas: Face and Neck  Relevant exam findings are noted in the Assessment and Plan.    Assessment & Plan   CYST Exam: Subcutaneous nodule at Near right eye  Treatment Plan: Benign-appearing. Exam most consistent with an epidermal inclusion cyst. Discussed that a cyst is a benign growth that can grow over time and sometimes get irritated or inflamed. Recommend observation if it is not bothersome. Discussed option of surgical excision to remove it if it is growing, symptomatic, or other changes noted. Please call for new or changing lesions so they can be evaluated. - We will plan to schedule with Dr. Corey Novel WART Exam: verrucous papule(s)  Counseling Discussed viral / HPV (Human Papilloma Virus) etiology and risk of spread /infectivity to other areas of body as well as to other people.  Multiple treatments and methods may be required to clear warts and it is possible treatment may not be successful.   Treatment risks include discoloration; scarring and there is still potential for wart recurrence.  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  Flared  Treatment Plan: - Comedones expressed and removed while in office today - We will plan to prescribe Tretinoin  0.025% to apply directly to apply to the face 2 nights weekly for the first month, then increase to 3 nights weekly  - Recommended to Schedule in the near future for Acne Procedure   FILIFORM WART (8) Left Malar Cheek (4), Right Malar Cheek (3), Right Zygomatic Area Destruction of lesion - Left Malar Cheek (4), Right Malar Cheek (3), Right Zygomatic Area Complexity: simple   Destruction method: cryotherapy   Informed consent: discussed and consent obtained   Timeout:  patient name, date of birth, surgical site, and procedure verified Lesion destroyed using liquid nitrogen: Yes   Cryotherapy cycles:  10 Post-procedure details: wound care instructions given   ACNE, UNSPECIFIED ACNE TYPE   Related Medications tretinoin  (RETIN-A ) 0.025 % cream Apply topically at bedtime.  Return in about 3 months (around 02/27/2024).  Documentation: I have reviewed the above documentation for accuracy and completeness, and I agree with the above.  I, Jetta Ager, am acting as scribe for Delon Lenis, DO.  Delon Lenis, DO

## 2023-11-30 NOTE — Patient Instructions (Addendum)
 Hello [name],  Thank you for visiting our clinic today. I appreciate your dedication to enhancing your skin health. Below is a summary of the essential instructions from today's consultation:  Referral to Dr. Rufus Holy: You are being referred to Dr. Holy, a Mohs surgeon, for the cyst removal under your eye. She will conduct minor surgery to excise the cyst on you right lower eyelid and have it analyzed to confirm it is benign.  Treatment for Blackheads and DPNs:   Extraction of some blackheads was performed today.   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 Numbing cream will be applied beforehand.  Treatment for Filiform Warts: The warts have been treated today by freezing. We will keep an eye on the healing process and address any remaining issues during your follow-up visit.  Prescription for Acne:   A topical retinoid is prescribed for use twice a week at night. Please apply a pea-sized amount, avoiding the eyes.   A hydrating cleanser and moisturizer are provided. Use the cleanser twice daily and the moisturizer after applying the retinoid.   Please begin your new skincare regimen in a week, allowing the treated spots time to heal. We are eager to see you back for your follow-up procedure and to evaluate the progress of your treatment.  Warm regards,  Dr. Delon Lenis Dermatology  Cryotherapy Aftercare  Wash gently with soap and water everyday.   Apply Vaseline and Band-Aid daily until healed.   Important Information   Due to recent changes in healthcare laws, you may see results of your pathology and/or laboratory studies on MyChart before the doctors have had a chance to review them. We understand that in some cases there may be results that are confusing or concerning to you. Please understand that not all results are received at the same time and often the doctors may need to interpret multiple results in order to provide you with  the best plan of care or course of treatment. Therefore, we ask that you please give us  2 business days to thoroughly review all your results before contacting the office for clarification. Should we see a critical lab result, you will be contacted sooner.     If You Need Anything After Your Visit   If you have any questions or concerns for your doctor, please call our main line at [phone]. If no one answers, please leave a voicemail as directed and we will return your call as soon as possible. Messages left after 4 pm will be answered the following business day.    You may also send us  a message via MyChart. We typically respond to MyChart messages within 1-2 business days.  For prescription refills, please ask your pharmacy to contact our office. Our fax number is [phone].  If you have an urgent issue when the clinic is closed that cannot wait until the next business day, you can page your doctor at the number below.     Please note that while we do our best to be available for urgent issues outside of office hours, we are not available 24/7.    If you have an urgent issue and are unable to reach us , you may choose to seek medical care at your doctor's office, retail clinic, urgent care center, or emergency room.   If you have a medical emergency, please immediately call 911 or go to the emergency department. In the event of inclement weather, please call our main line  at [phone] for an update on the status of any delays or closures.  Dermatology Medication Tips: Please keep the boxes that topical medications come in in order to help keep track of the instructions about where and how to use these. Pharmacies typically print the medication instructions only on the boxes and not directly on the medication tubes.   If your medication is too expensive, please contact our office at [phone] or send us  a message through MyChart.    We are unable to tell what your co-pay for  medications will be in advance as this is different depending on your insurance coverage. However, we may be able to find a substitute medication at lower cost or fill out paperwork to get insurance to cover a needed medication.    If a prior authorization is required to get your medication covered by your insurance company, please allow us  1-2 business days to complete this process.   Drug prices often vary depending on where the prescription is filled and some pharmacies may offer cheaper prices.   The website www.goodrx.com contains coupons for medications through different pharmacies. The prices here do not account for what the cost may be with help from insurance (it may be cheaper with your insurance), but the website can give you the price if you did not use any insurance.  - You can print the associated coupon and take it with your prescription to the pharmacy.  - You may also stop by our office during regular business hours and pick up a GoodRx coupon card.  - If you need your prescription sent electronically to a different pharmacy, notify our office through [facility] or by phone at [phone]

## 2023-12-04 ENCOUNTER — Other Ambulatory Visit: Payer: Self-pay | Admitting: Dermatology

## 2023-12-04 DIAGNOSIS — L709 Acne, unspecified: Secondary | ICD-10-CM

## 2023-12-18 ENCOUNTER — Encounter: Payer: Self-pay | Admitting: Dermatology

## 2023-12-18 ENCOUNTER — Ambulatory Visit (INDEPENDENT_AMBULATORY_CARE_PROVIDER_SITE_OTHER): Payer: 59 | Admitting: Dermatology

## 2023-12-18 VITALS — BP 126/69

## 2023-12-18 DIAGNOSIS — D492 Neoplasm of unspecified behavior of bone, soft tissue, and skin: Secondary | ICD-10-CM

## 2023-12-18 DIAGNOSIS — D2339 Other benign neoplasm of skin of other parts of face: Secondary | ICD-10-CM

## 2023-12-18 DIAGNOSIS — D485 Neoplasm of uncertain behavior of skin: Secondary | ICD-10-CM

## 2023-12-18 DIAGNOSIS — D489 Neoplasm of uncertain behavior, unspecified: Secondary | ICD-10-CM | POA: Diagnosis not present

## 2023-12-18 NOTE — Progress Notes (Signed)
 Follow-Up Visit   Subjective  [name] is a 63 y.o. female who presents for the following: Excision of a neoplasm of skin on the right lateral canthus, referred by Dr. Onalee Hua. Lesion has been present for about 1 year.  The following portions of the chart were reviewed this encounter and updated as appropriate: medications, allergies, medical history  Review of Systems:  No other skin or systemic complaints except as noted in HPI or Assessment and Plan.  Objective  Well appearing patient in no apparent distress; mood and affect are within normal limits.  A focused examination was performed of the following areas: Right eye Relevant physical exam findings are noted in the Assessment and Plan.   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 Skin excision  Excision method:  elliptical Lesion length (cm):  1 Lesion width (cm):  0.9 Margin per side (cm):  0.1 Total excision diameter (cm):  1.2 Informed consent: discussed and consent obtained   Timeout: patient name, date of birth, surgical site, and procedure verified   Procedure prep:  Patient was prepped and draped in usual sterile fashion Prep type:  Isopropyl alcohol and povidone-iodine Anesthesia: the lesion was anesthetized in a standard fashion   Anesthetic:  1% lidocaine w/ epinephrine 1-100,000 buffered w/ 8.4% NaHCO3 Instrument used: #15 blade   Hemostasis achieved with: pressure   Hemostasis achieved with comment:  Electrocautery Outcome: patient tolerated procedure well with no complications   Post-procedure details: sterile dressing applied and wound care instructions given   Dressing type: bandage and pressure dressing (mupirocin)    Skin repair Complexity:  Complex Final length (cm):  1.5 Informed consent: discussed and consent obtained   Timeout: patient name, date of birth, surgical site, and procedure verified    Procedure prep:  Patient was prepped and draped in usual sterile fashion Prep type:  Povidone-iodine Anesthesia: the lesion was anesthetized in a standard fashion   Anesthetic:  1% lidocaine w/ epinephrine 1-100,000 buffered w/ 8.4% NaHCO3 Reason for type of repair: reduce tension to allow closure, reduce the risk of dehiscence, infection, and necrosis, reduce subcutaneous dead space and avoid a hematoma, allow closure of the large defect, preserve normal anatomy, preserve normal anatomical and functional relationships and enhance both functionality and cosmetic results   Undermining: area extensively undermined   Undermining comment:  Undermining defect  Subcutaneous layers (deep stitches):  Suture size:  6-0 Suture type: Monocryl (poliglecaprone 25)   Stitches:  Buried vertical mattress (inverted dermal) Fine/surface layer approximation (top stitches):  Suture size:  6-0 Suture type: fast-absorbing plain gut   Stitches: simple running   Suture removal (days):  7 Hemostasis achieved with: suture and pressure Outcome: patient tolerated procedure well with no complications   Post-procedure details: sterile dressing applied and wound care instructions given   Dressing type: bandage and pressure dressing (mupirocin)    The surgical wound was then cleaned, prepped, and re-anesthetized as above. Wound edges were undermined extensively along at least one entire edge and at a distance equal to or greater than the width of the defect (see wound defect size above) in order to achieve closure and decrease wound tension and anatomic distortion. Redundant tissue repair including standing cone removal was performed. Hemostasis was achieved with electrocautery. Subcutaneous and epidermal tissues were approximated with the above sutures. The surgical site was then lightly scrubbed with sterile, saline-soaked gauze. The area was then bandaged  using Vaseline ointment, non-adherent gauze, gauze pads, and tape  to provide an adequate pressure dressing. The patient tolerated the procedure well, was given detailed written and verbal wound care instructions, and was discharged in good condition.   The patient will follow-up: 4 weeks.  Return in about 1 month (around 01/15/2024) for follow up with Dr Caralyn Guile.  Documentation: I have reviewed the above documentation for accuracy and completeness, and I agree with the above.  Gwenith Daily, MD

## 2023-12-18 NOTE — Patient Instructions (Signed)

## 2023-12-19 ENCOUNTER — Telehealth: Payer: Self-pay

## 2023-12-19 LAB — SURGICAL PATHOLOGY

## 2023-12-19 NOTE — Telephone Encounter (Signed)
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 Reassured patient on benign nature. OK to follow up in 6-12 months  Communication: MyChart

## 2023-12-19 NOTE — Telephone Encounter (Signed)
 Spoke with pt gave her bx results and recommendations

## 2023-12-21 ENCOUNTER — Ambulatory Visit (INDEPENDENT_AMBULATORY_CARE_PROVIDER_SITE_OTHER): Payer: Medicare Other

## 2023-12-21 VITALS — Ht 70.0 in | Wt 225.0 lb

## 2023-12-21 DIAGNOSIS — Z0000 Encounter for general adult medical examination without abnormal findings: Secondary | ICD-10-CM

## 2023-12-21 NOTE — Patient Instructions (Addendum)
 [name] , Thank you for taking time to come for your Medicare Wellness Visit. I appreciate your ongoing commitment to your health goals. Please review the following plan we discussed and let me know if I can assist you in the future.   Referrals/Orders/Follow-Ups/Clinician Recommendations: Yes; Keep maintaining your health by keeping your appointments with [facility] and any specialists that you may see.  Call us if you need anything.  Have a great year!!!!  This is a list of the screening recommended for you and due dates:  Health Maintenance  Topic Date Due   Pneumococcal Vaccination (1 of 2 - PCV) Never done   Zoster (Shingles) Vaccine (1 of 2) Never done   Mammogram  05/24/2012   DTaP/Tdap/Td vaccine (2 - Td or Tdap) 06/15/2022   Colon Cancer Screening  07/18/2022   Flu Shot  05/25/2023   COVID-19 Vaccine (5 - 2024-25 season) 06/25/2023   Medicare Annual Wellness Visit  12/20/2024   Hepatitis C Screening  Completed   HIV Screening  Completed   HPV Vaccine  Aged Out    Advanced directives: (Declined) Advance directive discussed with you today. Even though you declined this today, please call our office should you change your mind, and we can give you the proper paperwork for you to fill out.  Next Medicare Annual Wellness Visit scheduled for next year: Yes

## 2023-12-21 NOTE — Progress Notes (Signed)
 Subjective:   [name] is a 63 y.o. who presents for a Medicare Wellness preventive visit.  Visit Complete: Virtual I connected with  Vinson Moselle on 12/21/23 by a audio enabled telemedicine application and verified that I am speaking with the correct person using two identifiers.  Patient Location: Home  Provider Location: Office/Clinic  I discussed the limitations of evaluation and management by telemedicine. The patient expressed understanding and agreed to proceed.  Vital Signs: Because this visit was a virtual/telehealth visit, some criteria may be missing or patient reported. Any vitals not documented were not able to be obtained and vitals that have been documented are patient reported.  VideoDeclined- This patient declined Librarian, academic. Therefore the visit was completed with audio only.  AWV Questionnaire: No: Patient Medicare AWV questionnaire was not completed prior to this visit.  Cardiac Risk Factors include: advanced age (>72men, >55 women);dyslipidemia;family history of premature cardiovascular disease;hypertension;obesity (BMI >30kg/m2);Other (see comment) (staying active by walking; goal to get back to [facility])     Objective:    Today's Vitals   12/21/23 1424 12/21/23 1426  Weight: 225 lb (102.1 kg)   Height: 5\' 10"  (1.778 m)   PainSc: 10-Worst pain ever 10-Worst pain ever  PainLoc: Ear    Body mass index is 32.28 kg/m.     12/21/2023    2:26 PM 03/27/2023    3:01 PM 12/27/2022    8:44 AM 10/27/2022    2:49 PM 04/21/2022    1:27 PM 03/22/2022   10:33 AM 10/23/2021    6:25 AM  Advanced Directives  Does Patient Have a Medical Advance Directive? No No No No No No No  Would patient like information on creating a medical advance directive? No - Patient declined  No - Patient declined No - Patient declined No - Patient declined No - Patient declined     Current Medications (verified) Outpatient Encounter Medications as of  12/21/2023  Medication Sig   albuterol (VENTOLIN HFA) 108 (90 Base) MCG/ACT inhaler INHALE 2 PUFFS INTO THE LUNGS EVERY 6 (SIX) HOURS AS NEEDED. FOR SHORTNESS OF BREATH   aspirin 81 MG chewable tablet Chew 1 tablet (81 mg total) by mouth daily. Pt taking "some" days   benzonatate (TESSALON) 200 MG capsule Take 1 capsule (200 mg total) by mouth 2 (two) times daily as needed for cough.   cetirizine (ZYRTEC ALLERGY) 10 MG tablet Take 1 tablet (10 mg total) by mouth daily.   famotidine (PEPCID) 20 MG tablet TAKE 1 TABLET BY MOUTH ONCE TO TWICE DAILY   fluticasone (FLONASE) 50 MCG/ACT nasal spray PLACE 2 SPRAYS INTO BOTH NOSTRILS DAILY.   hydrochlorothiazide (HYDRODIURIL) 25 MG tablet TAKE 1 TABLET (25 MG TOTAL) BY MOUTH DAILY.   ibuprofen (ADVIL) 600 MG tablet Take 600 mg by mouth every 6 (six) hours as needed.   Multiple Vitamin (MULTIVITAMIN WITH MINERALS) TABS tablet Take 1 tablet by mouth daily.   pantoprazole (PROTONIX) 40 MG tablet TAKE ONE TABLET BY MOUTH ONCE TO TWICE DAILY   polyethylene glycol (MIRALAX / GLYCOLAX) 17 g packet Take 17 g by mouth 2 (two) times daily as needed.   rosuvastatin (CRESTOR) 10 MG tablet Take 1 tablet (10 mg total) by mouth daily.   Semaglutide,0.25 or 0.5MG /DOS, 2 MG/3ML SOPN Inject 0.25 mg into the skin once a week.   tretinoin (RETIN-A) 0.025 % cream Apply topically at bedtime.   VASCEPA 1 g capsule Take 2 capsules (2 g total) by mouth  2 (two) times daily. Take with food.   No facility-administered encounter medications on file as of 12/21/2023.    Allergies (verified) Patient has no known allergies.   History: Past Medical History:  Diagnosis Date   Allergy    uses Albuterol inhaler during allergy season   Dehydration 01/10/2019   Difficulty swallowing 01/10/2019   Diverticulosis 10/25/2011   Colonoscopy   Dysphagia 12/31/2018   GERD (gastroesophageal reflux disease)    Hiatal hernia    CT abdomen in August 2013   Hiccups 12/31/2018   Hx of colonic  polyps 10/25/2011   Colonoscopy    Hypertension    Left hip pain 07/29/2019   LLQ abdominal pain 01/10/2019   Osteoarthritis    b/l hip, seen on CT abd in 05/2012   Past Surgical History:  Procedure Laterality Date   BIOPSY  01/12/2019   Procedure: BIOPSY;  Surgeon: Lynann Bologna, MD;  Location: [facility] ENDOSCOPY;  Service: Endoscopy;;   ESOPHAGOGASTRODUODENOSCOPY (EGD) WITH PROPOFOL N/A 01/12/2019   Procedure: ESOPHAGOGASTRODUODENOSCOPY (EGD) WITH PROPOFOL;  Surgeon: Lynann Bologna, MD;  Location: [facility] ENDOSCOPY;  Service: Endoscopy;  Laterality: N/A;   MALONEY DILATION  01/12/2019   Procedure: Elease Hashimoto DILATION;  Surgeon: Lynann Bologna, MD;  Location: [facility] ENDOSCOPY;  Service: Endoscopy;;   TONSILLECTOMY     childhood   TOTAL ABDOMINAL HYSTERECTOMY W/ BILATERAL SALPINGOOPHORECTOMY  2008   Fibroids, done by Dr. Nicholaus Bloom at [facility]   Family History  Problem Relation Age of Onset   Stroke Father        passed away at 69 yo of cerebal brain hemorrhage    Hypertension Mother    Hyperlipidemia Mother    Colon cancer Neg Hx    Stomach cancer Neg Hx    Colon polyps Neg Hx    Rectal cancer Neg Hx    Social History   Socioeconomic History   Marital status: Single    Spouse name: Not on file   Number of children: 3   Years of education: 12th   Highest education level: Not on file  Occupational History   Occupation: Retired  Tobacco Use   Smoking status: Every Day    Current packs/day: 0.50    Average packs/day: 0.5 packs/day for 31.0 years (15.5 ttl pk-yrs)    Types: Cigarettes   Smokeless tobacco: Never  Vaping Use   Vaping status: Never Used  Substance and Sexual Activity   Alcohol use: Not Currently    Alcohol/week: 2.0 standard drinks of alcohol    Types: 2 Glasses of wine per week    Comment: 1-2 beers/week   Drug use: No   Sexual activity: Yes    Partners: Male    Comment: Same partner for 11 years  Other Topics Concern   Not on file  Social History Narrative    Live with her mother   Has three kids - all grown - all in [place] area   4 grandchildren   [place] at home - 11 years   Social Drivers of Corporate investment banker Strain: Low Risk  (12/21/2023)   Overall Financial Resource Strain (CARDIA)    Difficulty of Paying Living Expenses: Not hard at all  Food Insecurity: No Food Insecurity (12/21/2023)   Hunger Vital Sign    Worried About Running Out of Food in the Last Year: Never true    Ran Out of Food in the Last Year: Never true  Transportation Needs: No Transportation Needs (12/21/2023)   PRAPARE -  Administrator, Civil Service (Medical): No    Lack of Transportation (Non-Medical): No  Physical Activity: Sufficiently Active (12/21/2023)   Exercise Vital Sign    Days of Exercise per Week: 5 days    Minutes of Exercise per Session: 30 min  Stress: No Stress Concern Present (12/21/2023)   [employer] of Occupational Health - Occupational Stress Questionnaire    Feeling of Stress : Not at all  Social Connections: Socially Integrated (12/21/2023)   Social Connection and Isolation Panel [NHANES]    Frequency of Communication with Friends and Family: More than three times a week    Frequency of Social Gatherings with Friends and Family: More than three times a week    Attends Religious Services: More than 4 times per year    Active Member of [employer] or Organizations: Yes    Attends Engineer, structural: More than 4 times per year    Marital Status: Living with partner    Tobacco Counseling Ready to quit: Not Answered Counseling given: Not Answered    Clinical Intake:  Pre-visit preparation completed: Yes  Pain : 0-10 Pain Score: 10-Worst pain ever Pain Location: Ear Pain Orientation: Left, Right Pain Radiating Towards: none Pain Descriptors / Indicators: Aching, Discomfort, Pressure, Throbbing     BMI - recorded: 32.28 Nutritional Status: BMI > 30  Obese Nutritional Risks: None Diabetes:  No  How often do you need to have someone help you when you read instructions, pamphlets, or other written materials from your doctor or pharmacy?: 1 - Never What is the last grade level you completed in school?: HSG  Interpreter Needed?: No  Information entered by :: Susie Cassette, LPN.   Activities of Daily Living     12/21/2023    2:29 PM 12/27/2022    8:22 AM  In your present state of health, do you have any difficulty performing the following activities:  Hearing? 0 0  Vision? 0 1  Difficulty concentrating or making decisions? 0 0  Comment BSE: reading, games on phone   Walking or climbing stairs? 0 1  Dressing or bathing? 0 0  Doing errands, shopping? 0 0  Preparing Food and eating ? N   Using the Toilet? N   In the past six months, have you accidently leaked urine? N   Do you have problems with loss of bowel control? N   Managing your Medications? N   Managing your Finances? N   Housekeeping or managing your Housekeeping? N     Patient Care Team: Darral Dash, DO as PCP - General (Family Medicine)  Indicate any recent Medical Services you may have received from other than [facility] providers in the past year (date may be approximate).     Assessment:   This is a routine wellness examination for [place].  Hearing/Vision screen Hearing Screening - Comments:: Yes due to fluid patient has hearing difficulties.  Vision Screening - Comments:: Wears rx glasses - up to date with routine eye exams with Jethro Bolus, MD (retired)    Goals Addressed             This Visit's Progress    Client understands the importance of follow-up with providers by attending scheduled visits         Depression Screen     12/21/2023    2:28 PM 03/27/2023    3:01 PM 12/27/2022    8:21 AM 10/27/2022    2:54 PM 04/21/2022    1:26 PM 03/22/2022  10:33 AM 02/18/2021    9:34 AM  PHQ 2/9 Scores  PHQ - 2 Score 0 0 0 0 0 0 0  PHQ- 9 Score 0 0  0 0 0     Fall Risk     12/21/2023     2:27 PM 12/27/2022    8:22 AM 09/02/2020    3:01 PM 08/14/2019   11:24 AM 11/13/2012   10:26 AM  Fall Risk   Falls in the past year? 0 0 0 0 No  Number falls in past yr: 0 0 0    Injury with Fall? 0 0 0    Risk for fall due to : No Fall Risks No Fall Risks     Follow up Falls prevention discussed;Falls evaluation completed Falls evaluation completed       MEDICARE RISK AT HOME:  Medicare Risk at Home Any stairs in or around the home?: No If so, are there any without handrails?: No Home free of loose throw rugs in walkways, pet beds, electrical cords, etc?: Yes Adequate lighting in your home to reduce risk of falls?: Yes Life alert?: No Use of a cane, walker or w/c?: No Grab bars in the bathroom?: No Shower chair or bench in shower?: No Elevated toilet seat or a handicapped toilet?: No  TIMED UP AND GO:  Was the test performed?  No  Cognitive Function: 6CIT completed    12/21/2023    2:28 PM  MMSE - Mini Mental State Exam  Not completed: Unable to complete        12/21/2023    2:28 PM 12/27/2022    8:26 AM  6CIT Screen  What Year? 0 points 0 points  What month? 0 points 0 points  What time? 0 points 0 points  Count back from 20 0 points 0 points  Months in reverse 0 points 0 points  Repeat phrase 0 points 0 points  Total Score 0 points 0 points    Immunizations Immunization History  Administered Date(s) Administered   Influenza,inj,Quad PF,6+ Mos 06/21/2019   PFIZER Comirnaty(Gray Top)Covid-19 Tri-Sucrose Vaccine 02/09/2020, 03/01/2020, 09/08/2020, 04/09/2021   Tdap 06/15/2012    Screening Tests Health Maintenance  Topic Date Due   Pneumococcal Vaccine 61-59 Years old (1 of 2 - PCV) Never done   Zoster Vaccines- Shingrix (1 of 2) Never done   MAMMOGRAM  05/24/2012   DTaP/Tdap/Td (2 - Td or Tdap) 06/15/2022   Colonoscopy  07/18/2022   INFLUENZA VACCINE  05/25/2023   COVID-19 Vaccine (5 - 2024-25 season) 06/25/2023   Medicare Annual Wellness (AWV)   12/20/2024   Hepatitis C Screening  Completed   HIV Screening  Completed   HPV VACCINES  Aged Out    Health Maintenance  Health Maintenance Due  Topic Date Due   Pneumococcal Vaccine 76-16 Years old (1 of 2 - PCV) Never done   Zoster Vaccines- Shingrix (1 of 2) Never done   MAMMOGRAM  05/24/2012   DTaP/Tdap/Td (2 - Td or Tdap) 06/15/2022   Colonoscopy  07/18/2022   INFLUENZA VACCINE  05/25/2023   COVID-19 Vaccine (5 - 2024-25 season) 06/25/2023   Health Maintenance Items Addressed: Yes; Patient was advised that she is overdue for mammogram, colonoscopy and vaccines.  Additional Screening:  Vision Screening: Recommended annual ophthalmology exams for early detection of glaucoma and other disorders of the eye.  Dental Screening: Recommended annual dental exams for proper oral hygiene  Community Resource Referral / Chronic Care Management: CRR required this visit?  No   CCM required this visit?  No     Plan:     I have personally reviewed and noted the following in the patient's chart:   Medical and social history Use of alcohol, tobacco or illicit drugs  Current medications and supplements including opioid prescriptions. Patient is not currently taking opioid prescriptions. Functional ability and status Nutritional status Physical activity Advanced directives List of other physicians Hospitalizations, surgeries, and ER visits in previous 12 months Vitals Screenings to include cognitive, depression, and falls Referrals and appointments  In addition, I have reviewed and discussed with patient certain preventive protocols, quality metrics, and best practice recommendations. A written personalized care plan for preventive services as well as general preventive health recommendations were provided to patient.     Mickeal Needy, LPN   1/61/0960   After Visit Summary: (Declined) Due to this being a telephonic visit, with patients personalized plan was offered to  patient but patient Declined AVS at this time   Notes: Please refer to Routing Comments.

## 2023-12-30 ENCOUNTER — Ambulatory Visit (HOSPITAL_COMMUNITY)
Admission: RE | Admit: 2023-12-30 | Discharge: 2023-12-30 | Disposition: A | Discharge status: Home / Self Care | Source: Ambulatory Visit | Attending: Physician Assistant | Admitting: Physician Assistant

## 2023-12-30 ENCOUNTER — Encounter (HOSPITAL_COMMUNITY): Payer: Self-pay

## 2023-12-30 VITALS — BP 130/80 | HR 74 | Temp 98.4°F | Resp 16

## 2023-12-30 DIAGNOSIS — J0110 Acute frontal sinusitis, unspecified: Secondary | ICD-10-CM

## 2023-12-30 MED ORDER — AMOXICILLIN-POT CLAVULANATE 875-125 MG PO TABS: 1.0000 | ORAL_TABLET | Freq: Two times a day (BID) | ORAL | 0 refills | Status: DC

## 2023-12-30 MED ORDER — PREDNISONE 10 MG PO TABS
40.0000 mg | ORAL_TABLET | Freq: Every day | ORAL | 0 refills | Status: AC
Start: 2023-12-30 — End: 2024-01-04

## 2023-12-30 NOTE — Discharge Instructions (Signed)
 Take antibiotic as prescribed Recommend Flonase and Mucinex Take prednisone as prescribed Can take ibuprofen or Tylenol as needed

## 2023-12-30 NOTE — ED Provider Notes (Signed)
 [facility]    CSN: [id number] Arrival date & time: 12/30/23  1115      History   Chief Complaint Chief Complaint  Patient presents with   Nasal Congestion   ear issue   Dizziness    HPI [name] is a 63 y.o. female.   Patient presents with bilateral ear fullness, nasal congestion, sinus pressure and pain.  She reports she has been dealing with this issue over the last few weeks and it has becoming worse over this last week.  She reports trying Flonase but feels that makes her symptoms worse.  Reports mild intermittent nonproductive cough.  She denies fevers, chills, shortness of breath, wheezing.  Patient request prednisone reports this is the only thing that has helped her in the past with sinus infections.    Past Medical History:  Diagnosis Date   Allergy    uses Albuterol inhaler during allergy season   Dehydration 01/10/2019   Difficulty swallowing 01/10/2019   Diverticulosis 10/25/2011   Colonoscopy   Dysphagia 12/31/2018   GERD (gastroesophageal reflux disease)    Hiatal hernia    CT abdomen in August 2013   Hiccups 12/31/2018   Hx of colonic polyps 10/25/2011   Colonoscopy    Hypertension    Left hip pain 07/29/2019   LLQ abdominal pain 01/10/2019   Osteoarthritis    b/l hip, seen on CT abd in 05/2012    Patient Active Problem List   Diagnosis Date Noted   Viral URI with cough 10/10/2023   Hypertension 03/22/2022   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   Prediabetes 03/22/2022   Asymptomatic varicose veins of bilateral lower extremities 07/29/2019   Chronic venous insufficiency 07/29/2019   Protein-calorie malnutrition, severe 01/13/2019   Hypercalcemia 01/10/2019   Sinus bradycardia 01/10/2019   Calculus of gallbladder without cholecystitis without obstruction 12/31/2018   Tobacco use 12/31/2018   History of asthma 12/31/2018   Rash and nonspecific skin eruption 05/01/2013   DJD (degenerative joint disease) of knee 11/19/2012   HLD  (hyperlipidemia) 11/13/2012   Osteoarthritis of left knee 07/26/2012   GERD (gastroesophageal reflux disease) 06/15/2012   Health care maintenance 06/15/2012    Past Surgical History:  Procedure Laterality Date   BIOPSY  01/12/2019   Procedure: BIOPSY;  Surgeon: Lynann Bologna, MD;  Location: [facility] ENDOSCOPY;  Service: Endoscopy;;   ESOPHAGOGASTRODUODENOSCOPY (EGD) WITH PROPOFOL N/A 01/12/2019   Procedure: ESOPHAGOGASTRODUODENOSCOPY (EGD) WITH PROPOFOL;  Surgeon: Lynann Bologna, MD;  Location: [facility] ENDOSCOPY;  Service: Endoscopy;  Laterality: N/A;   MALONEY DILATION  01/12/2019   Procedure: Elease Hashimoto DILATION;  Surgeon: Lynann Bologna, MD;  Location: [facility] ENDOSCOPY;  Service: Endoscopy;;   TONSILLECTOMY     childhood   TOTAL ABDOMINAL HYSTERECTOMY W/ BILATERAL SALPINGOOPHORECTOMY  2008   Fibroids, done by Dr. Nicholaus Bloom at [facility]    OB History     Gravida  3   Para  3   Term      Preterm      AB      Living         SAB      IAB      Ectopic      Multiple      Live Births               Home Medications    Prior to Admission medications   Medication Sig Start Date End Date Taking? Authorizing Provider  amoxicillin-clavulanate (AUGMENTIN) 875-125 MG tablet Take 1 tablet by  mouth every 12 (twelve) hours. 12/30/23  Yes Ward, Tylene Fantasia, PA-C  predniSONE (DELTASONE) 10 MG tablet Take 4 tablets (40 mg total) by mouth daily for 5 days. 12/30/23 01/04/24 Yes Ward, Tylene Fantasia, PA-C  albuterol (VENTOLIN HFA) 108 (90 Base) MCG/ACT inhaler INHALE 2 PUFFS INTO THE LUNGS EVERY 6 (SIX) HOURS AS NEEDED. FOR SHORTNESS OF BREATH 03/27/23 03/26/24  Lilland, Percival Spanish, DO  aspirin 81 MG chewable tablet Chew 1 tablet (81 mg total) by mouth daily. Pt taking "some" days 03/27/23   Lilland, Alana, DO  benzonatate (TESSALON) 200 MG capsule Take 1 capsule (200 mg total) by mouth 2 (two) times daily as needed for cough. 10/10/23   Dameron, Nolberto Hanlon, DO  cetirizine (ZYRTEC ALLERGY) 10 MG tablet Take 1 tablet  (10 mg total) by mouth daily. 03/27/23   Lilland, Alana, DO  famotidine (PEPCID) 20 MG tablet TAKE 1 TABLET BY MOUTH ONCE TO TWICE DAILY 03/27/23 03/26/24  Lilland, Alana, DO  fluticasone (FLONASE) 50 MCG/ACT nasal spray PLACE 2 SPRAYS INTO BOTH NOSTRILS DAILY. 01/20/21 01/20/22  Anders Simmonds, PA-C  hydrochlorothiazide (HYDRODIURIL) 25 MG tablet TAKE 1 TABLET (25 MG TOTAL) BY MOUTH DAILY. 03/27/23 03/26/24  Lilland, Alana, DO  ibuprofen (ADVIL) 600 MG tablet Take 600 mg by mouth every 6 (six) hours as needed. 12/29/21   [provider]  Multiple Vitamin (MULTIVITAMIN WITH MINERALS) TABS tablet Take 1 tablet by mouth daily.    [provider]  pantoprazole (PROTONIX) 40 MG tablet TAKE ONE TABLET BY MOUTH ONCE TO TWICE DAILY 03/27/23 03/26/24  Lilland, Alana, DO  polyethylene glycol (MIRALAX / GLYCOLAX) 17 g packet Take 17 g by mouth 2 (two) times daily as needed. 02/07/19   Armbruster, Willaim Rayas, MD  rosuvastatin (CRESTOR) 10 MG tablet Take 1 tablet (10 mg total) by mouth daily. 03/27/23   Lilland, Alana, DO  Semaglutide,0.25 or 0.5MG /DOS, 2 MG/3ML SOPN Inject 0.25 mg into the skin once a week. 03/27/23   Lilland, Alana, DO  tretinoin (RETIN-A) 0.025 % cream Apply topically at bedtime. 11/30/23 11/29/24  Terri Piedra, DO  VASCEPA 1 g capsule Take 2 capsules (2 g total) by mouth 2 (two) times daily. Take with food. 03/28/23   Evelena Leyden, DO    Family History Family History  Problem Relation Age of Onset   Stroke Father        passed away at 60 yo of cerebal brain hemorrhage    Hypertension Mother    Hyperlipidemia Mother    Colon cancer Neg Hx    Stomach cancer Neg Hx    Colon polyps Neg Hx    Rectal cancer Neg Hx     Social History Social History   Tobacco Use   Smoking status: Every Day    Current packs/day: 0.25    Average packs/day: 0.3 packs/day for 31.0 years (7.8 ttl pk-yrs)    Types: Cigarettes   Smokeless tobacco: Never  Vaping Use   Vaping status: Never Used  Substance  Use Topics   Alcohol use: Yes    Alcohol/week: 2.0 standard drinks of alcohol    Types: 2 Glasses of wine per week   Drug use: No     Allergies   Patient has no known allergies.   Review of Systems Review of Systems  Constitutional:  Negative for chills and fever.  HENT:  Positive for congestion, ear pain, sinus pressure and sinus pain. Negative for sore throat.   Eyes:  Negative for pain and visual disturbance.  Respiratory:  Positive for cough. Negative for shortness of breath.   Cardiovascular:  Negative for chest pain and palpitations.  Gastrointestinal:  Negative for abdominal pain and vomiting.  Genitourinary:  Negative for dysuria and hematuria.  Musculoskeletal:  Negative for arthralgias and back pain.  Skin:  Negative for color change and rash.  Neurological:  Negative for seizures and syncope.  All other systems reviewed and are negative.    Physical Exam Triage Vital Signs ED Triage Vitals [12/30/23 1147]  Encounter Vitals Group     BP 130/80     Systolic BP Percentile      Diastolic BP Percentile      Pulse Rate 74     Resp 16     Temp 98.4 F (36.9 C)     Temp Source Oral     SpO2 94 %     Weight      Height      Head Circumference      Peak Flow      Pain Score 0     Pain Loc      Pain Education      Exclude from Growth Chart    No data found.  Updated Vital Signs BP 130/80 (BP Location: Left Arm)   Pulse 74   Temp 98.4 F (36.9 C) (Oral)   Resp 16   SpO2 94%   Visual Acuity Right Eye Distance:   Left Eye Distance:   Bilateral Distance:    Right Eye Near:   Left Eye Near:    Bilateral Near:     Physical Exam Vitals and nursing note reviewed.  Constitutional:      General: She is not in acute distress.    Appearance: She is well-developed.  HENT:     Head: Normocephalic and atraumatic.  Eyes:     Conjunctiva/sclera: Conjunctivae normal.  Cardiovascular:     Rate and Rhythm: Normal rate and regular rhythm.     Heart sounds:  No murmur heard. Pulmonary:     Effort: Pulmonary effort is normal. No respiratory distress.     Breath sounds: Normal breath sounds.  Abdominal:     Palpations: Abdomen is soft.     Tenderness: There is no abdominal tenderness.  Musculoskeletal:        General: No swelling.     Cervical back: Neck supple.  Skin:    General: Skin is warm and dry.     Capillary Refill: Capillary refill takes less than 2 seconds.  Neurological:     Mental Status: She is alert.  Psychiatric:        Mood and Affect: Mood normal.      UC Treatments / Results  Labs (all labs ordered are listed, but only abnormal results are displayed) Labs Reviewed - No data to display  EKG   Radiology No results found.  Procedures Procedures (including critical care time)  Medications Ordered in UC Medications - No data to display  Initial Impression / Assessment and Plan / UC Course  I have reviewed the triage vital signs and the nursing notes.  Pertinent labs & imaging results that were available during my care of the patient were reviewed by me and considered in my medical decision making (see chart for details).     Will treat for acute sinusitis.  Antibiotic prescribed.  Prednisone prescribed.  Supportive care discussed.  Return precautions discussed. Final Clinical Impressions(s) / UC Diagnoses   Final diagnoses:  Acute non-recurrent frontal sinusitis  Discharge Instructions      Take antibiotic as prescribed Recommend Flonase and Mucinex Take prednisone as prescribed Can take ibuprofen or Tylenol as needed     ED Prescriptions     Medication Sig Dispense Auth. Provider   amoxicillin-clavulanate (AUGMENTIN) 875-125 MG tablet Take 1 tablet by mouth every 12 (twelve) hours. 14 tablet Ward, Shanda Bumps Z, PA-C   predniSONE (DELTASONE) 10 MG tablet Take 4 tablets (40 mg total) by mouth daily for 5 days. 20 tablet Ward, Tylene Fantasia, PA-C      PDMP not reviewed this encounter.   Ward,  Tylene Fantasia, PA-C 12/30/23 1242

## 2023-12-30 NOTE — ED Triage Notes (Signed)
 Patient c/o bilateral ear fullness, dizziness, and nasal congestion x 4 days.

## 2024-01-02 ENCOUNTER — Encounter: Payer: Self-pay | Admitting: Dermatology

## 2024-01-02 ENCOUNTER — Ambulatory Visit: Payer: 59 | Admitting: Dermatology

## 2024-01-02 ENCOUNTER — Ambulatory Visit: Payer: Medicare Other | Admitting: Dermatology

## 2024-01-02 DIAGNOSIS — L819 Disorder of pigmentation, unspecified: Secondary | ICD-10-CM

## 2024-01-02 DIAGNOSIS — L708 Other acne: Secondary | ICD-10-CM | POA: Diagnosis not present

## 2024-01-02 DIAGNOSIS — L814 Other melanin hyperpigmentation: Secondary | ICD-10-CM

## 2024-01-02 NOTE — Progress Notes (Signed)
 Follow-Up Visit   Subjective  [name] is a 63 y.o. female who presents for the following: Acne Procedure  Patient present today for follow up visit. Patient was last evaluated on 11/30/23. At this visit patient was prescribed Tretinoin 0.025%. Patient reports sxs are better. Patient denies medication changes.  The following portions of the chart were reviewed this encounter and updated as appropriate: medications, allergies, medical history  Review of Systems:  No other skin or systemic complaints except as noted in HPI or Assessment and Plan.  Objective  Well appearing patient in no apparent distress; mood and affect are within normal limits.   A focused examination was performed of the following areas: face   Relevant exam findings are noted in the Assessment and Plan.  Head - Anterior (Face) Numerous large blackheads around eyes that are growing, tender and obstructing vision and on cheeks  Assessment & Plan  1. Acne w/ Open Comedones - Assessment: Patient presented for follow-up of comedones. She has been using tretinoin 0.025% nightly on her face with improvement in her complexion. Comedone extractions were performed on her face, eyelids, and bilateral cheeks during today's visit. - Plan:    Restart tretinoin 0.025% 1 week    Continue using salicylic acid face wash    Provided samples of Effaclar by Salicylic Acid    Follow up in 6 months for potential increase in tretinoin strength    Continue current regimen until next appointment  2. Neck Discoloration - Assessment: Patient reported discoloration on her neck. This is a new complaint that was not previously addressed with the current tretinoin regimen. - Plan:    Apply tretinoin 0.025% to neck, mixed with generous amount of moisturizer    Use once weekly for the first month    Increase to twice weekly after the first month   OTHER ACNE Head - Anterior (Face) Acne/Milia surgery - Head - Anterior  (Face) Patient Name: [name] Date of Procedure: 01/02/24 Procedure Performed by: Dr. Langston Reusing  Indication: Multiple open and closed comedones around the eyes, tender and obstructing vision.  Procedure Description:  The patient was brought to the procedure room and placed in a comfortable, seated position. The areas of concern were thoroughly cleaned and prepped with alcohol to ensure aseptic conditions.  A sterile 18-gauge needle was used to carefully dislodge the impacted sebum from the comedones around the patient's eyes. Using gentle pressure with applicator tips and a comedone extractor, the impacted sebum was successfully expressed and removed. In total, 25 large open and closed comedones were removed without complication.  Bleeding was minimal and was controlled with topical aluminum chloride applied to the affected areas.  After the procedure, Aquaphor was applied to the treated areas to provide a moisture barrier and promote healing.  Post-Procedure Instructions:  The patient was instructed to keep the area clean by gently cleansing with water and a mild cleanser. The patient was advised to avoid any harsh or abrasive products on the treated area. The patient was instructed to resume their retinoid treatment in 1 week, once healing has progressed. Follow-Up: A follow-up appointment is scheduled for [Insert Date], or sooner if any concerns arise.  Complications: There were no complications during or after the procedure   Return in about 6 months (around 07/04/2024) for acne.    Documentation: I have reviewed the above documentation for accuracy and completeness, and I agree with the above.   I, Shirron Marcha Solders, CMA, am acting as Neurosurgeon for  Langston Reusing, DO.   Langston Reusing, DO

## 2024-01-02 NOTE — Patient Instructions (Addendum)
 Hello Princess,  Thank you for visiting [place] today. Your dedication to improving your skin health is greatly appreciated. Below is a summary of the key instructions from today's appointment:  Tretinoin Prescription: Continue using the prescription tretinoin 0.025% at night. Please restart this regimen tonight.   Application Frequency: For your neck, apply tretinoin once a week for the first month. Afterward, increase the application to twice a week.   Combination with Moisturizer: Always mix tretinoin with a generous amount of moisturizer when applying.  Daily Routine: Maintain the use of the salicylic acid face wash as part of your daily skincare routine.  Product Samples: We have provided you with samples of Effaclar by Salicylic Acid for you to try out.  Follow-Up: We will see you back in 6 months to assess your progress and possibly increase the strength of your tretinoin.  Please continue with your current regimen until our next appointment. Should you have any questions or concerns before then, do not hesitate to contact our office.  Best regards,  Dr. Langston Reusing [facility]   Important Information  Due to recent changes in healthcare laws, you may see results of your pathology and/or laboratory studies on MyChart before the doctors have had a chance to review them. We understand that in some cases there may be results that are confusing or concerning to you. Please understand that not all results are received at the same time and often the doctors may need to interpret multiple results in order to provide you with the best plan of care or course of treatment. Therefore, we ask that you please give [place] 2 business days to thoroughly review all your results before contacting the office for clarification. Should we see a critical lab result, you will be contacted sooner.   If You Need Anything After Your Visit  If you have any questions or concerns for your doctor, please call our main  line at [phone] If no one answers, please leave a voicemail as directed and we will return your call as soon as possible. Messages left after 4 pm will be answered the following business day.   You may also send [place] a message via MyChart. We typically respond to MyChart messages within 1-2 business days.  For prescription refills, please ask your pharmacy to contact our office. Our fax number is [phone].  If you have an urgent issue when the clinic is closed that cannot wait until the next business day, you can page your doctor at the number below.    Please note that while we do our best to be available for urgent issues outside of office hours, we are not available 24/7.   If you have an urgent issue and are unable to reach [place], you may choose to seek medical care at your doctor's office, retail clinic, urgent care center, or emergency room.  If you have a medical emergency, please immediately call 911 or go to the emergency department. In the event of inclement weather, please call our main line at [phone] for an update on the status of any delays or closures.  Dermatology Medication Tips: Please keep the boxes that topical medications come in in order to help keep track of the instructions about where and how to use these. Pharmacies typically print the medication instructions only on the boxes and not directly on the medication tubes.   If your medication is too expensive, please contact our office at [phone] or send [place] a message through MyChart.  We are unable to tell what your co-pay for medications will be in advance as this is different depending on your insurance coverage. However, we may be able to find a substitute medication at lower cost or fill out paperwork to get insurance to cover a needed medication.   If a prior authorization is required to get your medication covered by your insurance company, please allow [place] 1-2 business days to complete this  process.  Drug prices often vary depending on where the prescription is filled and some pharmacies may offer cheaper prices.  The website www.goodrx.com contains coupons for medications through different pharmacies. The prices here do not account for what the cost may be with help from insurance (it may be cheaper with your insurance), but the website can give you the price if you did not use any insurance.  - You can print the associated coupon and take it with your prescription to the pharmacy.  - You may also stop by our office during regular business hours and pick up a GoodRx coupon card.  - If you need your prescription sent electronically to a different pharmacy, notify our office through [facility] or by phone at [phone]

## 2024-01-15 ENCOUNTER — Ambulatory Visit: Payer: 59 | Admitting: Dermatology

## 2024-02-05 ENCOUNTER — Encounter: Payer: Self-pay | Admitting: Dermatology

## 2024-02-05 ENCOUNTER — Ambulatory Visit (INDEPENDENT_AMBULATORY_CARE_PROVIDER_SITE_OTHER): Admitting: Dermatology

## 2024-02-05 DIAGNOSIS — Z86018 Personal history of other benign neoplasm: Secondary | ICD-10-CM | POA: Diagnosis not present

## 2024-02-05 DIAGNOSIS — L905 Scar conditions and fibrosis of skin: Secondary | ICD-10-CM

## 2024-02-05 DIAGNOSIS — L821 Other seborrheic keratosis: Secondary | ICD-10-CM | POA: Diagnosis not present

## 2024-02-05 DIAGNOSIS — D239 Other benign neoplasm of skin, unspecified: Secondary | ICD-10-CM | POA: Diagnosis not present

## 2024-02-05 NOTE — Patient Instructions (Addendum)

## 2024-02-06 NOTE — Progress Notes (Signed)
   Follow-Up Visit   Subjective  [name] is a 63 y.o. 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   She also has questions about lesions around her eyes, that have been present for about 1 year. Not previously treated, increasing in number.  The following portions of the chart were reviewed this encounter and updated as appropriate: medications, allergies, medical history  Review of Systems:  No other skin or systemic complaints except as noted in HPI or Assessment and Plan.  Objective  Well appearing patient in no apparent distress; mood and affect are within normal limits.  A focused examination was performed of the following areas: Face  Relevant physical exam findings are noted in the Assessment and Plan.     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, treated on 02/024/2025, repaired with linear closure - Reassured that wound has healed well - Discussed that scars take up to 12 months to mature from the date of surgery - Recommend SPF 30+ to scar daily to prevent purple color - OK to start scar massage at 4-6 weeks post-op - Can consider silicone based products for scar healing   Dermatosis Papulosa Nigra-face- periocular The patient was counseled on the diagnosis of dermatosis papulosa nigra (DPN), a common and benign skin condition characterized by small, dark brown to black papules that typically appear on the face, neck, and upper trunk. DPN is more commonly seen in individuals with darker skin tones and is considered a variant of seborrheic keratosis. The lesions are asymptomatic, though they may occasionally become irritated or cosmetically bothersome. It was emphasized that DPN is not cancerous and does not require treatment unless for cosmetic reasons or if the lesions become irritated. Treatment options, if desired, include electrocautery,  cryotherapy, laser therapy, or curettage, though these procedures carry a risk of post-inflammatory hyperpigmentation or scarring, particularly in patients with skin of color. The patient was reassured regarding the benign nature of the condition and advised that any changes in the lesions--such as rapid growth, bleeding, or irregular borders--should be promptly evaluated. Patient will schedule cosmetic removal of lesions.  Hidrocystoma The patient was counseled regarding the diagnosis of hydrocystoma, a benign, fluid-filled cyst that typically occurs in the sweat glands, particularly on the face or eyelids. Hydrocystomas are generally asymptomatic, presenting as small, translucent or flesh-colored bumps on the skin, and are often mistaken for other types of cysts or lesions. The patient was reassured that hydrocystomas are not cancerous and typically do not pose a significant health risk. However, if the cyst becomes bothersome, irritated, or cosmetically concerning, treatment options such as excision may be considered. The patient was advised that hydrocystomas are generally stable and that no treatment is necessary unless the lesion grows, changes in appearance, or causes discomfort. The patient was also educated on proper skin care to avoid further irritation and instructed to monitor the lesion for any changes. If any concerning symptoms arise, the patient was advised to return for follow-up and further evaluation.    Return for cosmetic DPNs removal.  Documentation: I have reviewed the above documentation for accuracy and completeness, and I agree with the above.  Gwenith Daily, MD

## 2024-03-07 ENCOUNTER — Other Ambulatory Visit (HOSPITAL_COMMUNITY): Payer: Self-pay

## 2024-04-02 DIAGNOSIS — H04123 Dry eye syndrome of bilateral lacrimal glands: Secondary | ICD-10-CM | POA: Diagnosis not present

## 2024-04-02 DIAGNOSIS — H2513 Age-related nuclear cataract, bilateral: Secondary | ICD-10-CM | POA: Diagnosis not present

## 2024-04-02 DIAGNOSIS — H02831 Dermatochalasis of right upper eyelid: Secondary | ICD-10-CM | POA: Diagnosis not present

## 2024-04-02 DIAGNOSIS — H02834 Dermatochalasis of left upper eyelid: Secondary | ICD-10-CM | POA: Diagnosis not present

## 2024-05-31 ENCOUNTER — Ambulatory Visit: Payer: Self-pay

## 2024-05-31 ENCOUNTER — Ambulatory Visit

## 2024-05-31 VITALS — BP 135/82 | HR 85 | Ht 70.5 in | Wt 258.0 lb

## 2024-05-31 DIAGNOSIS — R7303 Prediabetes: Secondary | ICD-10-CM

## 2024-05-31 DIAGNOSIS — I83893 Varicose veins of bilateral lower extremities with other complications: Secondary | ICD-10-CM

## 2024-05-31 DIAGNOSIS — Z720 Tobacco use: Secondary | ICD-10-CM | POA: Diagnosis not present

## 2024-05-31 DIAGNOSIS — Z0000 Encounter for general adult medical examination without abnormal findings: Secondary | ICD-10-CM

## 2024-05-31 DIAGNOSIS — E781 Pure hyperglyceridemia: Secondary | ICD-10-CM | POA: Diagnosis not present

## 2024-05-31 DIAGNOSIS — E6609 Other obesity due to excess calories: Secondary | ICD-10-CM

## 2024-05-31 DIAGNOSIS — K2100 Gastro-esophageal reflux disease with esophagitis, without bleeding: Secondary | ICD-10-CM | POA: Diagnosis not present

## 2024-05-31 DIAGNOSIS — E785 Hyperlipidemia, unspecified: Secondary | ICD-10-CM

## 2024-05-31 DIAGNOSIS — R030 Elevated blood-pressure reading, without diagnosis of hypertension: Secondary | ICD-10-CM | POA: Diagnosis not present

## 2024-05-31 DIAGNOSIS — J9801 Acute bronchospasm: Secondary | ICD-10-CM

## 2024-05-31 DIAGNOSIS — J309 Allergic rhinitis, unspecified: Secondary | ICD-10-CM

## 2024-05-31 DIAGNOSIS — Z6838 Body mass index (BMI) 38.0-38.9, adult: Secondary | ICD-10-CM

## 2024-05-31 DIAGNOSIS — E66812 Obesity, class 2: Secondary | ICD-10-CM

## 2024-05-31 LAB — POCT GLYCOSYLATED HEMOGLOBIN (HGB A1C): HbA1c, POC (prediabetic range): 5.9 % (ref 5.7–6.4)

## 2024-05-31 MED ORDER — CETIRIZINE HCL 10 MG PO TABS: 10.0000 mg | ORAL_TABLET | Freq: Every day | ORAL | 11 refills | Status: AC

## 2024-05-31 MED ORDER — PANTOPRAZOLE SODIUM 40 MG PO TBEC: DELAYED_RELEASE_TABLET | ORAL | 4 refills | Status: AC

## 2024-05-31 MED ORDER — NICOTINE 14 MG/24HR TD PT24: 14.0000 mg | MEDICATED_PATCH | Freq: Every day | TRANSDERMAL | 0 refills | Status: DC

## 2024-05-31 MED ORDER — VASCEPA 1 G PO CAPS: 2.0000 g | ORAL_CAPSULE | Freq: Two times a day (BID) | ORAL | 1 refills | Status: AC

## 2024-05-31 MED ORDER — SEMAGLUTIDE(0.25 OR 0.5MG/DOS) 2 MG/3ML ~~LOC~~ SOPN
0.2500 mg | PEN_INJECTOR | SUBCUTANEOUS | 0 refills | Status: DC
Start: 2024-05-31 — End: 2024-09-16

## 2024-05-31 MED ORDER — HYDROCHLOROTHIAZIDE 25 MG PO TABS: ORAL_TABLET | Freq: Every day | ORAL | 3 refills | Status: DC

## 2024-05-31 MED ORDER — FAMOTIDINE 20 MG PO TABS: ORAL_TABLET | ORAL | 4 refills | Status: AC

## 2024-05-31 MED ORDER — ALBUTEROL SULFATE HFA 108 (90 BASE) MCG/ACT IN AERS: INHALATION_SPRAY | RESPIRATORY_TRACT | 2 refills | Status: AC

## 2024-05-31 MED ORDER — ROSUVASTATIN CALCIUM 10 MG PO TABS: 10.0000 mg | ORAL_TABLET | Freq: Every day | ORAL | 3 refills | Status: DC

## 2024-05-31 NOTE — Patient Instructions (Addendum)
   It was great to see you!  Our plans for today:  - Medications refills - Follow up with Dr. Koval for Smoking cessation   We are checking some labs today, we will release these results to your MyChart.  Take care and seek immediate care sooner if you develop any concerns.       Houston Coralee HAS PGY 1 Family Medicine Resident [facility]  [address] [place], [place] [address] Fax [phone] Phone [phone] 05/31/2024, 2:19 PM

## 2024-05-31 NOTE — Progress Notes (Signed)
    SUBJECTIVE:   CHIEF COMPLAINT / HPI:  Ms. Rooks is a 63 YO female present at [facility] today for annually physical. She has been doing well. 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 Patient would like to become healthier by losing her weight and stop smoking. She has been smoking [place] half pack over 3 days.  She denies SOB, Palpitation, chest pain, abdominal pain, or change in bowel habit.    PERTINENT  PMH / PSH:  Tobacco use GERD HLD HTN Asthma  Prediabetes  Diverticulosis  OBJECTIVE:   BP 135/82   Pulse 85   Ht 5' 10.5 (1.791 m)   Wt 258 lb (117 kg)   SpO2 99%   BMI 36.50 kg/m   Physical Exam Constitutional:      Appearance: Normal appearance.  Cardiovascular:     Rate and Rhythm: Normal rate.     Pulses: Normal pulses.     Heart sounds: Normal heart sounds.  Pulmonary:     Effort: Pulmonary effort is normal.     Breath sounds: Normal breath sounds.  Musculoskeletal:        General: Normal range of motion.  Skin:    Capillary Refill: Capillary refill takes less than 2 seconds.  Neurological:     General: No focal deficit present.     Mental Status: She is alert and oriented to person, place, and time.  Psychiatric:        Mood and Affect: Mood normal.        Behavior: Behavior normal.      ASSESSMENT/PLAN:  Ms. Stapp is a 63 YO female present at [facility] today for annually physical. Patient would like to lose weight BMI 36.5 and stop smoking. She also c/f fluid in her right ear during this visit but no abnormal ears finding on physical exam Assessment & Plan Healthcare maintenance - All medications refilled - Place Screening Mammogram order  - GI referral for Colonoscopy screening - Patient refused CT Chest at this time - Refill GLP - 1 : Pt with BMI of 36.5 : Obesity - Discussed importance of diet and exercise changes Smoking trying to quit - Nicotine  patch ordered - Schedule appointment with Dr. Koval on 8/18 at 9am for  Smoking cessation - Smoking cessation information provided during this visit   Plan discuss with Dr.Pray attending physician who agreed with plan.    Anyra Kaufman, DO PGY 1 Family Medicine Resident [facility]

## 2024-06-01 LAB — CBC
Hematocrit: 42.1 % (ref 34.0–46.6)
Hemoglobin: 13.7 g/dL (ref 11.1–15.9)
MCH: 30.4 pg (ref 26.6–33.0)
MCHC: 32.5 g/dL (ref 31.5–35.7)
MCV: 93 fL (ref 79–97)
Platelets: 249 x10E3/uL (ref 150–450)
RBC: 4.51 x10E6/uL (ref 3.77–5.28)
RDW: 13.5 % (ref 11.7–15.4)
WBC: 9.2 x10E3/uL (ref 3.4–10.8)

## 2024-06-01 LAB — BASIC METABOLIC PANEL WITH GFR
BUN/Creatinine Ratio: 25 (ref 12–28)
BUN: 19 mg/dL (ref 8–27)
CO2: 22 mmol/L (ref 20–29)
Calcium: 9.5 mg/dL (ref 8.7–10.3)
Chloride: 106 mmol/L (ref 96–106)
Creatinine, Ser: 0.75 mg/dL (ref 0.57–1.00)
Glucose: 94 mg/dL (ref 70–99)
Potassium: 4.7 mmol/L (ref 3.5–5.2)
Sodium: 142 mmol/L (ref 134–144)
eGFR: 90 mL/min/1.73 (ref >59)

## 2024-06-01 LAB — LIPID PANEL
Chol/HDL Ratio: 7.9 ratio — ABNORMAL HIGH (ref 0.0–4.4)
Cholesterol, Total: 267 mg/dL — ABNORMAL HIGH (ref 100–199)
HDL: 34 mg/dL — ABNORMAL LOW (ref >39)
LDL Chol Calc (NIH): 125 mg/dL — ABNORMAL HIGH (ref 0–99)
Triglycerides: 594 mg/dL (ref 0–149)
VLDL Cholesterol Cal: 108 mg/dL — ABNORMAL HIGH (ref 5–40)

## 2024-06-03 ENCOUNTER — Telehealth: Payer: Self-pay | Admitting: *Deleted

## 2024-06-03 NOTE — Telephone Encounter (Signed)
 Pt reports pharmacy told her ozempic  need PA. Harlene Carte, CMA

## 2024-06-04 ENCOUNTER — Other Ambulatory Visit (HOSPITAL_COMMUNITY): Payer: Self-pay

## 2024-06-04 NOTE — Telephone Encounter (Signed)
 In process of submitting PA. Insurance not recognizing prescriber. Will attempt to submit under Dr. Donzetta

## 2024-06-04 NOTE — Telephone Encounter (Signed)
 Ozempic [name] is approved exclusively as an adjunct to diet and exercise to improve glycemic control in adults with type 2 diabetes mellitus. A review of patient's medical chart reveals no documented diagnosis of type 2 diabetes or an A1C indicative of diabetes. Therefore, they do not currently meet the criteria for prior authorization of this medication. If clinically appropriate, alternative  options such as Saxenda, Zepbound, or Wegovy  may be considered for this patient.

## 2024-06-05 ENCOUNTER — Telehealth: Payer: Self-pay

## 2024-06-05 NOTE — Addendum Note (Signed)
 Addended by: CORALEE ELDER on: 06/05/2024 11:23 PM   Modules accepted: Orders

## 2024-06-05 NOTE — Telephone Encounter (Signed)
 Tried to call patients multiple attempt without answer on her phone. Her Lipid panel came back abnormally elevated. I have ordered repeat Lipid panel to be drawn on Monday 06/10/24 if patient fasting before her visit with Dr. Koval

## 2024-06-05 NOTE — Telephone Encounter (Signed)
(  Sending to RN team as an RICK)   Pt called asking about Ozempic  prior approval. I informed pt that PA was not approved. Pt is asking if one of the alternative options such as Saxenda, Zepbound, or Wegovy  would be ok for her to use. If so pt would like to get one of those approved.  Please let pt know what medication she can use.  Margit Dimes, CMA

## 2024-06-10 ENCOUNTER — Ambulatory Visit: Admitting: Pharmacist

## 2024-06-13 ENCOUNTER — Encounter: Payer: Self-pay | Admitting: Pharmacist

## 2024-06-13 ENCOUNTER — Ambulatory Visit (INDEPENDENT_AMBULATORY_CARE_PROVIDER_SITE_OTHER): Admitting: Pharmacist

## 2024-06-13 VITALS — BP 109/62 | HR 82 | Wt 250.4 lb

## 2024-06-13 DIAGNOSIS — Z720 Tobacco use: Secondary | ICD-10-CM | POA: Diagnosis not present

## 2024-06-13 DIAGNOSIS — E785 Hyperlipidemia, unspecified: Secondary | ICD-10-CM | POA: Diagnosis not present

## 2024-06-13 DIAGNOSIS — R7303 Prediabetes: Secondary | ICD-10-CM

## 2024-06-13 MED ORDER — WEGOVY 0.5 MG/0.5ML ~~LOC~~ SOAJ: 0.5000 mg | SUBCUTANEOUS | 1 refills | Status: DC

## 2024-06-13 MED ORDER — VARENICLINE TARTRATE 0.5 MG PO TABS: 0.5000 mg | ORAL_TABLET | Freq: Two times a day (BID) | ORAL | 1 refills | Status: DC

## 2024-06-13 NOTE — Progress Notes (Signed)
 S:   Chief Complaint  Patient presents with  . Medication Management    Tobacco Cessation, DM, HLD   63 y.o. female who presents for evaluation/assistance with tobacco dependence. Patient is in good spirits.   Patient was referred and last seen by Primary Care Provider, Dr. Suzen, on 05/31/24.  At last visit, reports desire to quit smoking. Reports interest in switching Ozempic  (semaglutide ) to Wegovy  (semaglutide ) per insurance coverage.  PMH is significant for tobacco use disorder, prediabetes, hypertension, Hyperlipidemia, GERD.  Age when started using tobacco on a daily basis 63 year old. Brand smoked Newports, switched to Marlboro 3 years ago then back to [employer]. Increased from 2-3 per day to 0.5 ppd recently. Number of cigarettes/day 4-6/day in the past week.  Estimated nicotine  content per cigarette (mg) 1.7.  Estimated nicotine  intake per day 6.8 to 10.2 mg.   Reports waking to smoke. Smokes 1 times per night.   Reports time to first cigarette in the morning is <30 min after waking.  Most recent quit attempt a few years ago ~2021 Longest time ever been tobacco free 3 months  Medications used in past cessation efforts include:  NRT 21 mg patch- reports made her sick in past  Rates IMPORTANCE of quitting tobacco on 1-10 scale of 8-10. Rates CONFIDENCE of quitting tobacco on 1-10 scale of 8-10.  Most common triggers to use tobacco include: stress (taking care of mother and daughter), daughter smokes around her.  Reports stress level 8/10  Motivation to quit: tired of smoking, worried about health, expensive  Dietary habits: Breakfast: cottage cheese and pineapple, toast?, Ensure shake Lunch: crackers, peanut butter, apple Dinner: lamb, baked chicken, rice, veg Snacks: not into sweets Drinks: pepsi   O: Clinical ASCVD: No  The 10-year ASCVD risk score (Arnett DK, et al., 2019) is: 15.4%   Values used to calculate the score:     Age: 60 years     Clincally  relevant sex: Female     Is Non-Hispanic African American: Yes     Diabetic: No     Tobacco smoker: Yes     Systolic Blood Pressure: 109 mmHg     Is BP treated: Yes     HDL Cholesterol: 34 mg/dL     Total Cholesterol: 267 mg/dL  Review of Systems  All other systems reviewed and are negative.   Physical Exam Constitutional:      Appearance: Normal appearance.  Pulmonary:     Effort: Pulmonary effort is normal.  Neurological:     Mental Status: She is alert.  Psychiatric:        Mood and Affect: Mood normal.        Behavior: Behavior normal.        Thought Content: Thought content normal.        Judgment: Judgment normal.    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 Reports smoking 4-6 cigarettes per day with goal quit date of 6 months (February).  -Initiated varenicline  0.5 mg by mouth once daily with food x7 days, then 0.5 mg by mouth twice daily with food thereafter. -Patient counseled on purpose, proper use, and potential adverse effects, including GI upset and vivid dreams. -Provided information on 1 800-QUIT NOW support program.  -Plan to reduce cigarette us  to 2-3 per day by next visit 07/15/24.  Weight currently uncontrolled with BMI 35.42 kg/m2, last A1c 5.9% on 05/31/24. Patient previously tried to fill prescription for Ozempic  (semaglutide ) but was told Wegovy  (semaglutide ) is preferred by her insurance. -Initiated Wegovy  (semaglutide ) 0.25 mg weekly.  -Sent in  prescription for Wegovy  (semaglutide ) 0.5 mg to patient's pharmacy with plan to increase dose next month pending insurance approval and patient tolerability.  -Educated on diet changes including increased protein intake and switching pepsi to diet or zero sugar. -Patient educated on purpose, proper use, and potential adverse effects.  -Extensively discussed pathophysiology of diabetes, recommended lifestyle interventions, dietary effects on blood sugar control.  -Counseled on s/sx of and management of hypoglycemia.   ASCVD risk - primary prevention. Hyperlipidemia currently uncontrolled with LDL 125 mg/dL not at goal of <29 mg/dL and TG 405 mg/dL not at goal of <799 mg/dL. ASCVD risk factors include smoking history, prediabetes, hypertension, hyperlipidemia and 10-year ASCVD risk score of 15.4%. High intensity statin indicated. Lipid panel 05/31/24 was not fasting, plan to reevaluate need for therapy adjustments after fasting lipid panel results. -Continue rosuvastatin  10 mg daily and Vascepa  2 caps BID. -Ordered fasting lipid panel today.  Written patient instructions provided. Patient verbalized understanding of treatment plan.  Total time in face to face counseling 37 minutes.    Follow-up:  Pharmacist 07/15/24 at 10:30 am PCP clinic visit [facility] 12/23/24 Patient seen with Fonda Blase, PharmD Candidate - PY3 student and Calton Nash, PharmD Candidate - PY4 student.

## 2024-06-13 NOTE — Assessment & Plan Note (Signed)
 ASCVD risk - primary prevention. Hyperlipidemia currently uncontrolled with LDL 125 mg/dL not at goal of <29 mg/dL and TG 405 mg/dL not at goal of <799 mg/dL. ASCVD risk factors include smoking history, prediabetes, hypertension, hyperlipidemia and 10-year ASCVD risk score of 15.4%. High intensity statin indicated. Lipid panel 05/31/24 was not fasting, plan to reevaluate need for therapy adjustments after fasting lipid panel results. -Continue rosuvastatin  10 mg daily and Vascepa  2 caps BID. -Ordered fasting lipid panel today.

## 2024-06-13 NOTE — Assessment & Plan Note (Signed)
 Weight currently uncontrolled with BMI 35.42 kg/m2, last A1c 5.9% on 05/31/24. Patient previously tried to fill prescription for Ozempic  (semaglutide ) but was told Wegovy  (semaglutide ) is preferred by her insurance. -Initiated Wegovy  (semaglutide ) 0.25 mg weekly.  -Sent in prescription for Wegovy  (semaglutide ) 0.5 mg to patient's pharmacy with plan to increase dose next month pending insurance approval and patient tolerability.  -Educated on diet changes including increased protein intake and switching pepsi to diet or zero sugar. -Patient educated on purpose, proper use, and potential adverse effects.  -Extensively discussed pathophysiology of diabetes, recommended lifestyle interventions, dietary effects on blood sugar control.  -Counseled on s/sx of and management of hypoglycemia.

## 2024-06-13 NOTE — Assessment & Plan Note (Signed)
 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 Reports smoking 4-6 cigarettes per day with goal quit date of 6 months (February).  -Initiated varenicline  0.5 mg by mouth once daily with food x7 days, then 0.5 mg by mouth twice daily with food thereafter. -Patient counseled on purpose, proper use, and potential adverse effects, including GI upset and vivid dreams. -Provided information on 1 800-QUIT NOW support program.  -Plan to reduce cigarette us  to 2-3 per day by next visit 07/15/24.

## 2024-06-13 NOTE — Patient Instructions (Addendum)
 Nice to see you today! Congratulations on taking the first step to quit smoking!  Aim for reducing the amount you smoke by half (2-3 cigarettes per day) by the time of next visit.  My target quit date is: February (6 months)  Medication Changes: - START Wegovy  (semaglutide ) 0.25 mg inject subcutaneously once weekly. This medication may cause stomach upset, queasiness, or constipation, especially when first starting. This generally improves over time. Call our office if these symptoms occur and worsen, or if you have severe symptoms such as vomiting, diarrhea, or stomach pain.    - START Chantix  (varenicline ) 0.5 mg 1 tab once daily with food for 7 days, then increase to 1 tab twice daily with food.  - Continue all other medication the same.   Tobacco Patient Instructions  Quitting smoking is one of the most important decisions you can make for your current and future health. Consider what you dislike about smoking and how quitting could personally benefit you. Try to cut down.   Starting today, Be a Quitter!  Remind yourself why you want to quit.  Delay your first cigarette of the day for as long as possible.  Start cleaning out all pockets, drawers, and your car of cigarettes.  Getting Through the Cravings Once You Are Smoke Free: Each craving will last about 10 minutes, whether or not you smoke. Here's how to get through the cravings without cigarettes:  DELAY: Tell yourself that you'll wait for the next craving. Do it every time! DEEP BREATHS: One reason smoking feels good is because you breathe in deeply to inhale. Take four slow, deep breaths and feel the relaxation without the hamful effects of cigarettes. DRINK WATER: Drink a glass of cool water. It will give your hands and mouth something to do and will help flush the nicotine  out of your system faster. DIVERT: Do something else -- brush your teeth, take a walk, call a friend who can offer you support. Just moving onto something  other than thinking about cigarettes will move you through the craving.  Frequently Asked Questions  What can I do when I get the urge to smoke? To get through the urge to smoke, try the following:  Review your reasons for quitting and think of all the benefits to your health, your finances, and your family.  Remind yourself that there is no such thing as just one cigarette -- or even one puff.  Ride out the desire to smoke. Use the 4 Os -- Delay, Deep Breaths, Drink Water and Divert to get you through. The craving will go away eventually. Do not fool yourself into thinking you can have just one cigarette.  Any tips on how to deal with stress? Stress is a natural part of life. The key is to deal with it without reaching for a cigarette. Taking deep breaths, counting backwards from 10 and asking yourself 1-how big a deal is this?"  Writing down your feelings, talking with a friend and doing things like positive self-talk and meditation are some other ways that people deal with daily stress.  What if I start smoking again? Slips happen. Most people try to quit smoking a few times before they are successful. Don't beat yourself up if this happens to you! Ask yourself if this was a slip or a relapse. A slip is a one-time mistake that is quickly corrected. A relapse is going back to your old smoking habits.   If you slip, don't give up. Think of it  as a [employer]. Ask yourself what went wrong and renew your commitment to staying away from smoking for good.  If you relapse, try not to get discouraged. Ask yourself the question "What caused me to start smoking?" Figure out what helped you and what didn't when you tried to quit. Knowing why you relapsed is useful information for your next attempt to quit.  Please bring all medications to your clinic visits.  Please arrive 10-15 minutes prior to your scheduled visit time.

## 2024-06-14 ENCOUNTER — Ambulatory Visit: Payer: Self-pay

## 2024-06-14 ENCOUNTER — Other Ambulatory Visit (HOSPITAL_COMMUNITY): Payer: Self-pay

## 2024-06-14 ENCOUNTER — Telehealth: Payer: Self-pay

## 2024-06-14 LAB — LIPID PANEL
Chol/HDL Ratio: 6.1 ratio — ABNORMAL HIGH (ref 0.0–4.4)
Cholesterol, Total: 244 mg/dL — ABNORMAL HIGH (ref 100–199)
HDL: 40 mg/dL (ref >39)
LDL Chol Calc (NIH): 143 mg/dL — ABNORMAL HIGH (ref 0–99)
Triglycerides: 331 mg/dL — ABNORMAL HIGH (ref 0–149)
VLDL Cholesterol Cal: 61 mg/dL — ABNORMAL HIGH (ref 5–40)

## 2024-06-14 MED ORDER — ROSUVASTATIN CALCIUM 40 MG PO TABS: 40.0000 mg | ORAL_TABLET | Freq: Every day | ORAL | 0 refills | Status: DC

## 2024-06-14 NOTE — Telephone Encounter (Signed)
 Prior authorization submitted for WEGOVY  0.5MG  to [facility] via [employer].   Key: [id number]

## 2024-06-14 NOTE — Progress Notes (Signed)
 Reviewed and agree with Dr Rennis plan.

## 2024-06-15 MED ORDER — ROSUVASTATIN CALCIUM 40 MG PO TABS: 40.0000 mg | ORAL_TABLET | Freq: Every day | ORAL | 0 refills | Status: AC

## 2024-06-17 ENCOUNTER — Telehealth: Payer: Self-pay

## 2024-06-17 MED ORDER — ROSUVASTATIN CALCIUM 40 MG PO TABS: 40.0000 mg | ORAL_TABLET | Freq: Every day | ORAL | 11 refills | Status: DC

## 2024-06-17 NOTE — Telephone Encounter (Signed)
 Informed patient of RX and message from Dr. Koval per Dr.Suknaim.  Patient very appreciative.  Cena JONELLE Pesa, CMA

## 2024-06-17 NOTE — Telephone Encounter (Signed)
 Mailed letter to patient per Dr. Suzen.  [name], CMA

## 2024-06-17 NOTE — Telephone Encounter (Signed)
 Pharmacy Patient Advocate Encounter  Received notification from [employer] that Prior Authorization for WEGOVY  0.5MG  has been DENIED.  Full denial letter will be uploaded to the media tab. See denial reason below.  Wegovy  may be covered when used for a medically-accepted indication. The Food and Drug Administration has approved Wegovy  for the treatment to reduce the risk of major adverse cardiovascular events (cardiovascular death, non-fatal myocardial infarction, or non-fatal stroke) or metabolic dysfunction-associated steatohepatitis. Any request outside of these indications is an exclusion.  PA #/Case ID/Reference #: [id number]

## 2024-06-17 NOTE — Progress Notes (Signed)
 Called patient today to confirmed that she aware of increase  Crestor  dosage from 20 daily to 40 daily d/t her abnormally high lipid panel labs. In addition, Patient state she has been on Wegovy  and went to pharmacy to get the refill but was told her insurance will not cover the cost of the medication.

## 2024-06-17 NOTE — Telephone Encounter (Signed)
 Patient called stating that her doctor changed the dosage on her Crestor  last week, went to pick it up from the pharmacy, but was told they didn't receive a prescription for her. Asks if the prescription can please be sent in the the [facility] on [address].

## 2024-07-08 ENCOUNTER — Telehealth: Payer: Self-pay | Admitting: Pharmacist

## 2024-07-08 NOTE — Telephone Encounter (Signed)
 Patient contacted for follow-up visit rescheduling.   Since last contact patient reports she has taken last dose of Wegovy  (semaglutide )   Reschedled visit for 9/19 at 10:30  Total time with patient call and documentation of interaction: 7 minutes.

## 2024-07-11 ENCOUNTER — Ambulatory Visit: Admitting: Dermatology

## 2024-07-11 ENCOUNTER — Encounter: Payer: Self-pay | Admitting: Dermatology

## 2024-07-11 VITALS — BP 113/75 | HR 68

## 2024-07-11 DIAGNOSIS — L700 Acne vulgaris: Secondary | ICD-10-CM | POA: Diagnosis not present

## 2024-07-11 DIAGNOSIS — L709 Acne, unspecified: Secondary | ICD-10-CM

## 2024-07-11 DIAGNOSIS — L819 Disorder of pigmentation, unspecified: Secondary | ICD-10-CM

## 2024-07-11 DIAGNOSIS — L810 Postinflammatory hyperpigmentation: Secondary | ICD-10-CM

## 2024-07-11 MED ORDER — TRETINOIN 0.025 % EX CREA: TOPICAL_CREAM | Freq: Every day | CUTANEOUS | 2 refills | Status: DC

## 2024-07-11 MED ORDER — TRETINOIN 0.05 % EX CREA: TOPICAL_CREAM | Freq: Every day | CUTANEOUS | 4 refills | Status: AC

## 2024-07-11 NOTE — Patient Instructions (Addendum)
 Date: Thu Jul 11 2024  Hello [name],  Thank you for visiting today. Here is a summary of the key instructions:  - Skin Care: Morning   - Wash face with Salicylic Acid Wash in the morning   - Apply Eucerin Radiant Tone to face and neck in the morning   - Apply Avene Tolerance Moisturizer in the morning  Evening   - Wash face with a gentle cleanser in the evening   - Apply Eucerin Radiant Tone Lightning Serum in the evening   - Apply a pea-sized amount of Tretinoin  0.05% 2 nights per week, follow with Ciclofate by Avene   - Apply Eucerin Radiant Tone on other nights, follow with Ciclofate by Avene  - Follow-up:   - Continue this skincare routine until March   - Return in March for regimen adjustment  Please reach out if you have any questions or concerns.  Warm regards,  Dr. Delon Lenis [facility]   Important Information  Due to recent changes in healthcare laws, you may see results of your pathology and/or laboratory studies on MyChart before the doctors have had a chance to review them. We understand that in some cases there may be results that are confusing or concerning to you. Please understand that not all results are received at the same time and often the doctors may need to interpret multiple results in order to provide you with the best plan of care or course of treatment. Therefore, we ask that you please give us  2 business days to thoroughly review all your results before contacting the office for clarification. Should we see a critical lab result, you will be contacted sooner.   If You Need Anything After Your Visit  If you have any questions or concerns for your doctor, please call our main line at [phone] If no one answers, please leave a voicemail as directed and we will return your call as soon as possible. Messages left after 4 pm will be answered the following business day.   You may also send us  a message via MyChart. We typically respond to MyChart  messages within 1-2 business days.  For prescription refills, please ask your pharmacy to contact our office. Our fax number is [phone].  If you have an urgent issue when the clinic is closed that cannot wait until the next business day, you can page your doctor at the number below.    Please note that while we do our best to be available for urgent issues outside of office hours, we are not available 24/7.   If you have an urgent issue and are unable to reach us , you may choose to seek medical care at your doctor's office, retail clinic, urgent care center, or emergency room.  If you have a medical emergency, please immediately call 911 or go to the emergency department. In the event of inclement weather, please call our main line at [phone] for an update on the status of any delays or closures.  Dermatology Medication Tips: Please keep the boxes that topical medications come in in order to help keep track of the instructions about where and how to use these. Pharmacies typically print the medication instructions only on the boxes and not directly on the medication tubes.   If your medication is too expensive, please contact our office at [phone] or send us  a message through MyChart.   We are unable to tell what your co-pay for medications will be in advance as this is different  depending on your insurance coverage. However, we may be able to find a substitute medication at lower cost or fill out paperwork to get insurance to cover a needed medication.   If a prior authorization is required to get your medication covered by your insurance company, please allow us  1-2 business days to complete this process.  Drug prices often vary depending on where the prescription is filled and some pharmacies may offer cheaper prices.  The website www.goodrx.com contains coupons for medications through different pharmacies. The prices here do not account for what the cost may be with help  from insurance (it may be cheaper with your insurance), but the website can give you the price if you did not use any insurance.  - You can print the associated coupon and take it with your prescription to the pharmacy.  - You may also stop by our office during regular business hours and pick up a GoodRx coupon card.  - If you need your prescription sent electronically to a different pharmacy, notify our office through [facility] or by phone at [phone]

## 2024-07-11 NOTE — Progress Notes (Signed)
   Follow-Up Visit   Subjective  [name] is a 63 y.o. female who presents for the following: acne  Patient present today for follow up visit for acne. Patient was last evaluated on 01/02/24. At this visit patient was advised to restart tretinoin  0.025% 1 week and continue using salicylic acid face wash. samples of Effaclar by Salicylic Acid were provided. Patient reports sxs are better. Patient reports medication changes she is now in wegovy  for weight loss.   Patient also following up on neck discoloration she was advised to apply tretinoin  0.025% to neck, mixed with generous amount of moisturizer and to use once weekly for the first month and to increase to twice weekly after the first month.  The following portions of the chart were reviewed this encounter and updated as appropriate: medications, allergies, medical history  Review of Systems:  No other skin or systemic complaints except as noted in HPI or Assessment and Plan.  Objective  Well appearing patient in no apparent distress; mood and affect are within normal limits.  A focused examination was performed of the following areas: face  Relevant exam findings are noted in the Assessment and Plan.             Assessment & Plan   1. Mild Acne Vulgaris with Comedonal Component - Assessment: Patient's acne has shown significant improvement, particularly with reduction in comedones on the face. The skin texture and complexion have greatly improved. Previously using tretinoin  0.025% on face and neck. - Plan:    Increase tretinoin  to 0.05%, apply 2 nights per week    Morning regimen: wash face with Salicylic Acid Wash, apply Excedrin Radiant Tone to face and neck, apply Avene Tolerance Moisturizer    Evening regimen: wash face with gentle cleanser, apply Excedrin Radiant Tone Lightning Serum, apply tretinoin  0.05% 2 nights per week followed by Ciclofate    On non-tretinoin  nights: apply Excedrin Radiant Tone followed by  Ciclophate by Avene    Continue this regimen until March, then adjust for seasonal changes  2. Hyperpigmentation - Assessment: Patient presents with ongoing concerns about hyperpigmentation. The incorporation of targeted treatments aims to address this issue. - Plan:    Incorporate Excedrin Radiant Tone for pigmentation    Apply Excedrin Radiant Tone Lightning Serum in evening regimen    Continue treatment as part of overall skincare regimen until March    Reassess in March and adjust regimen as needed  Follow-up in March to assess response to treatment modifications.   No follow-ups on file.  I, Doyce Pan, CMA, am acting as scribe for [employer], DO.   Documentation: I have reviewed the above documentation for accuracy and completeness, and I agree with the above.  Delon Lenis, DO

## 2024-07-12 ENCOUNTER — Ambulatory Visit: Admitting: Pharmacist

## 2024-07-12 ENCOUNTER — Encounter: Payer: Self-pay | Admitting: Pharmacist

## 2024-07-12 VITALS — BP 104/67 | HR 72 | Wt 241.8 lb

## 2024-07-12 DIAGNOSIS — R7303 Prediabetes: Secondary | ICD-10-CM | POA: Diagnosis not present

## 2024-07-12 DIAGNOSIS — Z720 Tobacco use: Secondary | ICD-10-CM | POA: Diagnosis not present

## 2024-07-12 MED ORDER — SEMAGLUTIDE (1 MG/DOSE) 4 MG/3ML ~~LOC~~ SOPN: 0.2500 mg | PEN_INJECTOR | SUBCUTANEOUS | Status: DC

## 2024-07-12 MED ORDER — INSULIN PEN NEEDLE 32G X 4 MM MISC: 1.0000 | 0 refills | Status: AC

## 2024-07-12 NOTE — Assessment & Plan Note (Addendum)
 Tobacco use disorder with mild nicotine  dependence of 46 years duration in a patient who is good candidate for success because of motivation to quit.   Currently smoking ~ 4 cigs per day.  -Continued varenicline  0.5 mg by mouth twice daily with food thereafter. Patient counseled on purpose, proper use, and potential adverse effects, including GI upset. -Plan to be at least 1 cig per day lower by next visit on 08/09/24

## 2024-07-12 NOTE — Patient Instructions (Addendum)
 It was nice to see you today!  Continue making all your great progress!  Medication Changes: START Ozempic  (semaglutide ) dialing up to 20 clicks on the pen and then remember to hold for 10 seconds after injection.  Pick up needles from the pharmacy when you run out.  Continue all other medication the same.  Keep up the good work with diet and exercise. Aim for a diet full of vegetables, fruit and lean meats (chicken, [place], fish). Try to limit salt intake by eating fresh or frozen vegetables (instead of canned), rinse canned vegetables prior to cooking and do not add any additional salt to meals.

## 2024-07-12 NOTE — Progress Notes (Signed)
   S:   Chief Complaint  Patient presents with   Medication Management    Weight loss/Tobacco cessation   63 y.o. female who presents for evaluation/assistance with tobacco dependence. Patient is in good spirits.    Patient was referred and last seen by Primary Care Provider, Dr. Suzen, on 05/31/24.  At last visit, reported smoking ~6 cigs/day, and was initiated on varenicline  0.5 mg twice daily and Wegovy  (semaglutide ) 0.25 mg weekly on saturdays. Reports Wegovy  (semaglutide ) being administered by daughter at home.  PMH is significant for obesity, TUD, Hyperlipidemia.   Number of cigarettes/day 4 days down from 6.  Denies waking to smoke. Routine smoking: First with coffee, then with meals and before bed.   Rates IMPORTANCE of quitting tobacco on 1-10 scale of 10. Rates CONFIDENCE of quitting tobacco on 1-10 scale of 8 by February.  Most common triggers to use tobacco  Motivation to quit: to better her life!  Dietary Habits: Drinking more water. Breathing and tasting better! Clothes fitting better Eating more fruits and vegetables. Has cut out most fried foods.  O: Clinical ASCVD: No  The 10-year ASCVD risk score (Arnett DK, et al., 2019) is: 11.8%   Values used to calculate the score:     Age: 63 years     Clincally relevant sex: Female     Is Non-Hispanic African American: Yes     Diabetic: No     Tobacco smoker: Yes     Systolic Blood Pressure: 104 mmHg     Is BP treated: Yes     HDL Cholesterol: 40 mg/dL     Total Cholesterol: 244 mg/dL  Review of Systems  Musculoskeletal:  Positive for joint pain (right ankle).  All other systems reviewed and are negative.   Physical Exam Constitutional:      Appearance: Normal appearance.  Pulmonary:     Effort: Pulmonary effort is normal.  Neurological:     Mental Status: She is alert.  Psychiatric:        Mood and Affect: Mood normal.        Behavior: Behavior normal.        Thought Content: Thought content  normal.        Judgment: Judgment normal.     A/P: Tobacco use disorder with mild nicotine  dependence of 46 years duration in a patient who is good candidate for success because of motivation to quit.   Currently smoking ~ 4 cigs per day.  -Continued varenicline  0.5 mg by mouth twice daily with food thereafter. Patient counseled on purpose, proper use, and potential adverse effects, including GI upset. -Plan to be at least 1 cig per day lower by next visit on 08/09/24  Weight currently uncontrolled with BMI 34.20 kg/m2, last A1c 5.9% on 05/31/24. PA for Wegovy  (semaglutide ) was denied  -Initiated Ozempic  (semaglutide ) 1 mg pen with sample and educated on dialing pen to only 20 clicks (0.25 mg dose) weekly. Patient demonstrated understanding and will have daughter continue to administer. -Patient educated on purpose, proper use, and potential adverse effects.  -Extensively discussed pathophysiology of diabetes, recommended lifestyle interventions, dietary effects on blood sugar control.  -Counseled on s/sx of and management of hypoglycemia.    Written patient instructions provided. Patient verbalized understanding of treatment plan.  Total time in face to face counseling 33 minutes.    Follow-up:  Pharmacist 08/09/24 PCP clinic visit 08/29/24 Patient seen with Fonda Blase, PharmD Candidate - [employer] student

## 2024-07-12 NOTE — Progress Notes (Signed)
 Reviewed and agree with Dr Rennis plan.

## 2024-07-12 NOTE — Assessment & Plan Note (Signed)
 Weight currently uncontrolled with BMI 34.20 kg/m2, last A1c 5.9% on 05/31/24. PA for Wegovy  (semaglutide ) was denied  -Initiated Ozempic  (semaglutide ) 1 mg pen with sample and educated on dialing pen to only 20 clicks (0.25 mg dose) weekly. Patient demonstrated understanding and will have daughter continue to administer. -Patient educated on purpose, proper use, and potential adverse effects.  -Extensively discussed pathophysiology of diabetes, recommended lifestyle interventions, dietary effects on blood sugar control.  -Counseled on s/sx of and management of hypoglycemia.

## 2024-07-15 ENCOUNTER — Ambulatory Visit: Admitting: Pharmacist

## 2024-08-02 ENCOUNTER — Encounter: Payer: Self-pay | Admitting: Family Medicine

## 2024-08-09 ENCOUNTER — Ambulatory Visit: Admitting: Pharmacist

## 2024-08-20 ENCOUNTER — Encounter: Payer: Self-pay | Admitting: Family Medicine

## 2024-08-23 ENCOUNTER — Encounter: Payer: Self-pay | Admitting: Pharmacist

## 2024-08-23 ENCOUNTER — Ambulatory Visit: Admitting: Student

## 2024-08-23 ENCOUNTER — Encounter: Payer: Self-pay | Admitting: Student

## 2024-08-23 ENCOUNTER — Ambulatory Visit (INDEPENDENT_AMBULATORY_CARE_PROVIDER_SITE_OTHER): Admitting: Pharmacist

## 2024-08-23 VITALS — BP 108/67 | HR 68 | Ht 70.5 in | Wt 241.0 lb

## 2024-08-23 VITALS — BP 108/67 | HR 68 | Wt 241.0 lb

## 2024-08-23 DIAGNOSIS — H65191 Other acute nonsuppurative otitis media, right ear: Secondary | ICD-10-CM | POA: Diagnosis not present

## 2024-08-23 DIAGNOSIS — R7303 Prediabetes: Secondary | ICD-10-CM | POA: Diagnosis not present

## 2024-08-23 DIAGNOSIS — Z720 Tobacco use: Secondary | ICD-10-CM

## 2024-08-23 DIAGNOSIS — R42 Dizziness and giddiness: Secondary | ICD-10-CM | POA: Diagnosis not present

## 2024-08-23 MED ORDER — CEFDINIR 300 MG PO CAPS: 600.0000 mg | ORAL_CAPSULE | Freq: Every day | ORAL | 0 refills | Status: AC

## 2024-08-23 NOTE — Assessment & Plan Note (Signed)
 History of prediabetes and is motivated to lose weight. Has previously been on Ozempic  (semaglutide ) 0.25 mg by using 20 clicks of 1 mg pen that was given as a sample. Expresses that she is working consistently on weight loss and is eating healthier meals and exercising more. Patient reports some constipation with 0.25 mg from Saturday (injection day) to Wednesday which is relieved with Miralax  and Magnesium citrate. -Increased dose to 25 clicks of Ozempic  (semaglutide ) 1 mg pen and asked to monitor for worsening constipation. -Counseled patient on dietary changes.  Medication Samples have been provided to the patient.  Drug name: Ozempic    Strength: 1mg         Qty: 1 LOT: [id number] Exp.Date: 10/23/2026 Dosing instructions: Use 25 clicks weekly.

## 2024-08-23 NOTE — Progress Notes (Signed)
 S:   Chief Complaint  Patient presents with   Medication Management    Tobacco cessation and Glucose    63 y.o. female who presents for evaluation/assistance with tobacco dependence. Patient is in good spirits.  Patient was referred and last seen by Primary Care Provider, Dr. Suzen, on 05/31/24.  At last visit, patient was referred for tobacco cessation and seen last 07/12/2024.   PMH is significant for tobacco use, hyperlipidemia, GERD, hypertension, prediabetes.   Age when started using tobacco on a daily basis 18. Brand smoked [place]. Number of cigarettes/day  half pack per day previously and now down to 2-3. Denies smoking at all in the past few days due to dizziness.  Estimated nicotine  content per cigarette (mg) : 0.5 mg.  Estimated nicotine  intake per day 1.5 mg over the last weeks and zero for the last few days.     O: Clinical ASCVD: No  The 10-year ASCVD risk score (Arnett DK, et al., 2019) is: 11.8%   Values used to calculate the score:     Age: 38 years     Clincally relevant sex: Female     Is Non-Hispanic African American: Yes     Diabetic: No     Tobacco smoker: Yes     Systolic Blood Pressure: 104 mmHg     Is BP treated: Yes     HDL Cholesterol: 40 mg/dL     Total Cholesterol: 244 mg/dL  Review of Systems  HENT:         Ear fullness water in ear feeling  Neurological:  Positive for dizziness.       Vertigo symptoms.   All other systems reviewed and are negative.   Physical Exam Vitals reviewed.  Constitutional:      Appearance: Normal appearance.  Neurological:     Mental Status: She is alert.  Psychiatric:        Mood and Affect: Mood normal.        Behavior: Behavior normal.        Thought Content: Thought content normal.        Judgment: Judgment normal.   Deferred Vertigo like symptoms to Dr. Howell, access to care clinic provider in office today.   A/P: Tobacco use disorder with severe nicotine  dependence in a patient who is  excellent candidate for success because of motivation to quit. She has gone down from half a pack of cigarettes to 2-3 cigarettes a day. Reports that she has not been smoking in the past few days because she has been very dizzy and ear pain. Assessment by Dr. Howell reveals ear infection and cefdinir prescribed. Encouraged patient to quit now since she has not been smoking due to feeling ill.  -Continued varenicline  0.5 mg by mouth twice daily with food . Patient counseled on purpose, proper use, and potential adverse effects, including GI upset. -Provided information on 1 800-QUIT NOW support program.   History of prediabetes and is motivated to lose weight. Has previously been on Ozempic  (semaglutide ) 0.25 mg by using 20 clicks of 1 mg pen that was given as a sample. Expresses that she is working consistently on weight loss and is eating healthier meals and exercising more. Patient reports some constipation with 0.25 mg from Saturday (injection day) to Wednesday which is relieved with Miralax  and Magnesium citrate. -Increased dose to 25 clicks of Ozempic  (semaglutide ) 1 mg pen and asked to monitor for worsening constipation. -Counseled patient on dietary changes.  Medication Samples have been  provided to the patient. Drug name: Ozempic    Strength: 1mg         Qty: 1 LOT: [id number] Exp.Date: 10/23/2026 Dosing instructions: Use 25 clicks weekly.  Supply adequate for > 2 months.   The patient has been instructed regarding the correct time, dose, and frequency of taking this medication, including desired effects and most common side effects.   Maude Lagos 11:36 AM 08/23/2024  Written patient instructions provided. Patient verbalized understanding of treatment plan.  Total time in face to face counseling 25 minutes.    Follow-up:  Pharmacist 09/16/24 PCP clinic visit 08/29/24 Patient seen with Lawson Mao, PharmD Candidate - [employer] student

## 2024-08-23 NOTE — Progress Notes (Signed)
    SUBJECTIVE:   CHIEF COMPLAINT / HPI:    Timing: 1.5 weeks, episodic Duration: Seconds to minutes Triggers: Blowing nose, lying down Medications: Reviewed medications, taking meclizine (took this morning) she received this OTC Substances: Tobacco Associated sxs? (Cardiac, etc.): Ear fullness, otalgia of right ear.  No other systemic symptoms, no neurologic symptoms.  No hearing loss, no tinnitus.  On chart review, saw ENT in 2020-believed symptoms related to eustachian tube dysfunction not ear infection.  She reports she was having dizziness at this time 2.  It looks like she was given a short course of steroids and nasal spray, was recommended to follow-up with ENT however she never followed up.  OBJECTIVE:   There were no vitals taken for this visit.   General: NAD, pleasant HEENT: Normocephalic, atraumatic head.  Normal left ear.  Right bulging TM, erythematous.  Normal right canal and external ear. Cardio: RRR, no MRG. Skin: Warm and dry Neuro: Oriented x 4, no overt focal neurologic deficits on exam  ASSESSMENT/PLAN:   Assessment & Plan Other acute nonsuppurative otitis media of right ear, recurrence not specified Dizziness Triggered, episodic dizziness.  Evidence of right sided AOM-likely cause given acuity plus symptoms of ear infection.  Additional differential trigger episodic dizziness includes: Orthostatic hypotension, BPPV.  No neurologic symptoms/red flags to suggest central cause.  Patient requests antibiotic other than Augmentin  (per her preference). - Cefdinir 600 mg daily for 5 days - Follow-up with PCP scheduled for 09/12/2024 - If symptoms do not resolve by then, recommend orthostatic vitals and Dix-Hallpike (unless signs of acute vestibular syndrome on reexamination) - Also consider referral to ENT, as prior concern for eustachian tube dysfunction    Gladis Church, DO [facility]

## 2024-08-23 NOTE — Patient Instructions (Signed)
 It was great to see you! Thank you for allowing me to participate in your care!   I recommend that you always bring your medications to each appointment as this makes it easy to ensure we are on the correct medications and helps us  not miss when refills are needed.  Our plans for today:  - Take 1 tablet of cefdinir once a day for 5 days - If your symptoms do not resolve with antibiotic, please return to care as you may have other reasons for bulging tympanic membrane and dizziness  Take care and seek immediate care sooner if you develop any concerns. Please remember to show up 15 minutes before your scheduled appointment time!  Gladis Church, DO [facility]

## 2024-08-23 NOTE — Assessment & Plan Note (Addendum)
 Tobacco use disorder with severe nicotine  dependence in a patient who is excellent candidate for success because of motivation to quit. She has gone down from half a pack of cigarettes to 2-3 cigarettes a day. Reports that she has not been smoking in the past few days because she has been very dizzy and ear pain. Assessment by Dr. Howell reveals ear infection and cefdinir prescribed. Encouraged patient to quit now since she has not been smoking due to feeling ill.    -Continued varenicline  0.5 mg by mouth twice daily with food . Patient counseled on purpose, proper use, and potential adverse effects, including GI upset. -Provided information on 1 800-QUIT NOW support program.

## 2024-08-23 NOTE — Patient Instructions (Signed)
 Nice to see you today! Congratulations on your progress with smoking cessation!   Continue Chantix  (varenicline ) 0.5 mg twice a day.  Increase Ozempic  (semaglutide ) dose to 25 clicks.  Tobacco Patient Instructions  Quitting smoking is one of the most important decisions you can make for your current and future health. Consider what you dislike about smoking and how quitting could personally benefit you. Try to cut down.   Starting today, Be a Quitter!  Remind yourself why you want to quit.  Delay your first cigarette of the day for as long as possible.  Start cleaning out all pockets, drawers, and your car of cigarettes.  Getting Through the Cravings Once You Are Smoke Free: Each craving will last about 10 minutes, whether or not you smoke. Here's how to get through the cravings without cigarettes:  DELAY: Tell yourself that you'll wait for the next craving. Do it every time! DEEP BREATHS: One reason smoking feels good is because you breathe in deeply to inhale. Take four slow, deep breaths and feel the relaxation without the hamful effects of cigarettes. DRINK WATER: Drink a glass of cool water. It will give your hands and mouth something to do and will help flush the nicotine  out of your system faster. DIVERT: Do something else -- brush your teeth, take a walk, call a friend who can offer you support. Just moving onto something other than thinking about cigarettes will move you through the craving.   Frequently Asked Questions  What can I do when I get the urge to smoke? To get through the urge to smoke, try the following:  Review your reasons for quitting and think of all the benefits to your health, your finances, and your family.  Remind yourself that there is no such thing as just one cigarette -- or even one puff.  Ride out the desire to smoke. Use the 4 Os -- Delay, Deep Breaths, Drink Water and Divert to get you through. The craving will go away eventually. Do not fool yourself  into thinking you can have just one cigarette.  Any tips on how to deal with stress? Stress is a natural part of life. The key is to deal with it without reaching for a cigarette. Taking deep breaths, counting backwards from 10 and asking yourself 1-how big a deal is this?"  Writing down your feelings, talking with a friend and doing things like positive self-talk and meditation are some other ways that people deal with daily stress.  What if I start smoking again? Slips happen. Most people try to quit smoking a few times before they are successful. Don't beat yourself up if this happens to you! Ask yourself if this was a slip or a relapse. A slip is a one-time mistake that is quickly corrected. A relapse is going back to your old smoking habits.   If you slip, don't give up. Think of it as a learning experience. Ask yourself what went wrong and renew your commitment to staying away from smoking for good.  If you relapse, try not to get discouraged. Ask yourself the question "What caused me to start smoking?" Figure out what helped you and what didn't when you tried to quit. Knowing why you relapsed is useful information for your next attempt to quit.  Please bring all medications to your clinic visits.  Please arrive 10-15 minutes prior to your scheduled visit time.

## 2024-08-27 NOTE — Progress Notes (Signed)
 Reviewed and agree with Dr Rennis plan.

## 2024-08-29 ENCOUNTER — Ambulatory Visit (INDEPENDENT_AMBULATORY_CARE_PROVIDER_SITE_OTHER)

## 2024-08-29 VITALS — BP 111/68 | HR 80 | Ht 70.5 in | Wt 240.6 lb

## 2024-08-29 DIAGNOSIS — R42 Dizziness and giddiness: Secondary | ICD-10-CM | POA: Diagnosis not present

## 2024-08-29 DIAGNOSIS — Z0000 Encounter for general adult medical examination without abnormal findings: Secondary | ICD-10-CM | POA: Diagnosis not present

## 2024-08-29 DIAGNOSIS — I959 Hypotension, unspecified: Secondary | ICD-10-CM

## 2024-08-29 NOTE — Patient Instructions (Signed)
   It was great to see you!  Our plans for today:  - f/u w/ ENT as referral - Schedule Mammogram and Colonoscopy - Please stop taking Sudafed -  f/u as needed  Take care and seek immediate care sooner if you develop any concerns.     Houston Coralee HAS PGY 1 Family Medicine Resident [facility]  [address] [place], [place] [address] Fax [phone] Phone [phone] 08/29/2024, 1:56 PM

## 2024-08-29 NOTE — Progress Notes (Signed)
    SUBJECTIVE:   CHIEF COMPLAINT / HPI: f/u dizziness  Ms. [name] is a 63 YO Female present at [facility] today for f/u w/ her dizziness c/f ear in fection which she was prescribed with Cefdinir 600 mg daily for 5 days. Patient state she has been doing well and completed the antibiotic course as order. No dizziness episode since last visit but still with feeling vertigo episodically. She also noticed her slightly low BP ~100/70 at home and still w/ right ear popping sensation. She denies fever, chest pain, SOB, N/V/D.    PERTINENT  PMH / PSH:  GERD HLD Asthma HTN Pre DM     OBJECTIVE:   BP 111/68   Pulse 80   Ht 5' 10.5 (1.791 m)   Wt 240 lb 9.6 oz (109.1 kg)   SpO2 99%   BMI 34.03 kg/m   Physical Exam Constitutional:      Appearance: Normal appearance.  Cardiovascular:     Rate and Rhythm: Normal rate.     Pulses: Normal pulses.  Pulmonary:     Effort: Pulmonary effort is normal.  Abdominal:     Palpations: Abdomen is soft.  Skin:    Capillary Refill: Capillary refill takes less than 2 seconds.  Neurological:     Mental Status: She is alert and oriented to person, place, and time. Mental status is at baseline.      ASSESSMENT/PLAN:   Assessment & Plan Dizziness Completed ABX as prescribed from last visit but still hearing right ear pop.  - ENT referral - Recommend stop taking Sudafed - Recommending Neti Pot Daily -  f/u as needed Hypotensive episode Orthostatic Blood pressure check during this visit with BP WNL : no orthostatic - Continue HTN home medication - Continue to monitor BP at home Healthcare maintenance Mammogram : Will call for schedule per patient Colonoscopy Will call for schedule per patient Vaccination : DTAP/Td Flu Covid pneumonia Shingle vaccinations offered but pt declined during this visit   Houston Samuels, DO PGY 1 Family Medicine Resident [facility]

## 2024-09-09 NOTE — Progress Notes (Signed)
 [name]                                          MRN: [id number]   09/09/2024   The [facility] Quality Team Specialist reviewed this patient medical record for the purposes of chart review for care gap closure. The following were reviewed: chart review for care gap closure-breast cancer screening and colorectal cancer screening.    [facility] Quality Team

## 2024-09-16 ENCOUNTER — Encounter: Payer: Self-pay | Admitting: Pharmacist

## 2024-09-16 ENCOUNTER — Ambulatory Visit: Admitting: Pharmacist

## 2024-09-16 VITALS — BP 97/64 | HR 75 | Wt 234.6 lb

## 2024-09-16 DIAGNOSIS — Z720 Tobacco use: Secondary | ICD-10-CM

## 2024-09-16 DIAGNOSIS — R7303 Prediabetes: Secondary | ICD-10-CM | POA: Diagnosis not present

## 2024-09-16 DIAGNOSIS — E6609 Other obesity due to excess calories: Secondary | ICD-10-CM

## 2024-09-16 DIAGNOSIS — E785 Hyperlipidemia, unspecified: Secondary | ICD-10-CM

## 2024-09-16 MED ORDER — SEMAGLUTIDE(0.25 OR 0.5MG/DOS) 2 MG/3ML ~~LOC~~ SOPN: 0.2500 mg | PEN_INJECTOR | SUBCUTANEOUS | Status: DC

## 2024-09-16 NOTE — Progress Notes (Signed)
 S:   Chief Complaint  Patient presents with   Medication Management    Tobacco cessation - Weight Management   63 y.o. female who presents for evaluation/assistance with tobacco dependence. Patient is in good spirits.   Patient was referred and last seen by Primary Care Provider, Dr. Suzen, on 08/29/2024.   Age when started using tobacco on a daily basis 18. Brand smoked [place]. Number of cigarettes/day  half pack per day previously and now down intermittent. Has smoked only 4 cigarettes since last visit one month ago. Estimated nicotine  content per cigarette (mg) : 0.5 mg.  Most common triggers to use tobacco include; stress   O: Clinical ASCVD: No  The 10-year ASCVD risk score (Arnett DK, et al., 2019) is: 9.9%   Values used to calculate the score:     Age: 65 years     Clincally relevant sex: Female     Is Non-Hispanic African American: Yes     Diabetic: No     Tobacco smoker: Yes     Systolic Blood Pressure: 97 mmHg     Is BP treated: Yes     HDL Cholesterol: 40 mg/dL     Total Cholesterol: 244 mg/dL  Review of Systems  All other systems reviewed and are negative.   Physical Exam Vitals reviewed.  Constitutional:      Appearance: Normal appearance.  Pulmonary:     Effort: Pulmonary effort is normal.  Neurological:     Mental Status: She is alert.  Psychiatric:        Mood and Affect: Mood normal.        Behavior: Behavior normal.        Thought Content: Thought content normal.        Judgment: Judgment normal.    A/P: Tobacco use disorder with nicotine  dependence in a patient who is excellent candidate for success because of high motivation to quit and is surrounded with people who motivate her who are also former smokers.  Patient reports that she has smoked only 4 cigarettes since her last pharmacy visit. Congratulated patient on this progress. Patient has high confidence that she can quit by the end of the year (5 weeks).  -Initiated nicotine   replacement tx with nicotine  gum. Patient counseled on purpose, proper use, and potential adverse effects.   -Continued varenicline  0.5 mg by mouth twice daily with food . Patient counseled on purpose, proper use, and potential adverse effects, including GI upset. -Provided information on 1 800-QUIT NOW support program.   History of prediabetes and is motivated to lose weight. Has previously been on Ozempic  (semaglutide ) ~0.25 mg by using 25 clicks of 1 mg pen that was given as a sample. Expresses that she is working consistently on weight loss and is eating healthier meals and exercising more. Patient reports some constipation with 0.25 mg but has been resolved with use of miralax . -Continued Ozempic  (semaglutide ) 1 mg pen at 25 clicks and asked to monitor for worsening constipation. Medication sample provided -Counseled patient on dietary changes.  Medication Samples have been provided to the patient. Drug name: Ozempic  (semaglutide ) Strength: 1mg         Qty: 1  LOT: [id number] Exp.Date: 10/23/2026  Dosing instructions: Use 25 clicks weekly   The patient has been instructed regarding the correct time, dose, and frequency of taking this medication, including desired effects and most common side effects.   Maude Lagos 12:46 PM 09/16/2024  Written patient instructions provided. Patient verbalized understanding of treatment  plan.  Total time in face to face counseling 23 minutes.    Follow-up:  Pharmacist 10/29/2023 PCP  visit 2-3 months Patient seen with Lawson Mao, PharmD Candidate - PY3 student

## 2024-09-16 NOTE — Progress Notes (Signed)
 Reviewed and agree with Dr Rennis plan.

## 2024-09-16 NOTE — Assessment & Plan Note (Signed)
 History of prediabetes and is motivated to lose weight. Has previously been on Ozempic  (semaglutide ) 0.25 mg by using 25 clicks of 1 mg pen that was given as a sample. Expresses that she is working consistently on weight loss and is eating healthier meals and exercising more. Patient reports some constipation with 0.25 mg but has been resolved with use of miralax . -Continued Ozempic  (semaglutide ) 1 mg pen at 25 clicks and asked to monitor for worsening constipation. Medication sample provided -Counseled patient on dietary changes.

## 2024-09-16 NOTE — Assessment & Plan Note (Addendum)
 Tobacco use disorder with nicotine  dependence in a patient who is excellent candidate for success because of high motivation to quit and is surrounded with people who motivate her who are also former smokers.  Patient reports that she has smoked only 4 cigarettes since her last pharmacy visit. Congratulated patient on this progress. Patient has high confidence that she can quit by the end of the year (5 weeks).  -Initiated nicotine  replacement tx with nicotine  gum. Patient counseled on purpose, proper use, and potential adverse effects.   -Continued varenicline  0.5 mg by mouth twice daily with food . Patient counseled on purpose, proper use, and potential adverse effects, including GI upset. -Provided information on 1 800-QUIT NOW support program.

## 2024-09-16 NOTE — Patient Instructions (Addendum)
 Nice to see you today! Congratulations on all your progress with smoking cessation!  Medication Changes: - START nicotine  gum as needed when you have a craving for cigarettes. - Continue all other medication the same.  Tobacco Patient Instructions  Quitting smoking is one of the most important decisions you can make for your current and future health. Consider what you dislike about smoking and how quitting could personally benefit you. Try to cut down.   My target quit date is: 10/23/2024  Starting today, Be a Quitter!  Remind yourself why you want to quit.  Delay your first cigarette of the day for as long as possible.  Start cleaning out all pockets, drawers, and your car of cigarettes.  Getting Through the Cravings Once You Are Smoke Free: Each craving will last about 10 minutes, whether or not you smoke. Here's how to get through the cravings without cigarettes:  DELAY: Tell yourself that you'll wait for the next craving. Do it every time! DEEP BREATHS: One reason smoking feels good is because you breathe in deeply to inhale. Take four slow, deep breaths and feel the relaxation without the hamful effects of cigarettes. DRINK WATER: Drink a glass of cool water. It will give your hands and mouth something to do and will help flush the nicotine  out of your system faster. DIVERT: Do something else -- brush your teeth, take a walk, call a friend who can offer you support. Just moving onto something other than thinking about cigarettes will move you through the craving.   Frequently Asked Questions  What can I do when I get the urge to smoke? To get through the urge to smoke, try the following:  Review your reasons for quitting and think of all the benefits to your health, your finances, and your family.  Remind yourself that there is no such thing as just one cigarette -- or even one puff.  Ride out the desire to smoke. Use the 4 Os -- Delay, Deep Breaths, Drink Water and Divert to  get you through. The craving will go away eventually. Do not fool yourself into thinking you can have just one cigarette.  Any tips on how to deal with stress? Stress is a natural part of life. The key is to deal with it without reaching for a cigarette. Taking deep breaths, counting backwards from 10 and asking yourself 1-how big a deal is this?"  Writing down your feelings, talking with a friend and doing things like positive self-talk and meditation are some other ways that people deal with daily stress.  What if I start smoking again? Slips happen. Most people try to quit smoking a few times before they are successful. Don't beat yourself up if this happens to you! Ask yourself if this was a slip or a relapse. A slip is a one-time mistake that is quickly corrected. A relapse is going back to your old smoking habits.   If you slip, don't give up. Think of it as a learning experience. Ask yourself what went wrong and renew your commitment to staying away from smoking for good.  If you relapse, try not to get discouraged. Ask yourself the question "What caused me to start smoking?" Figure out what helped you and what didn't when you tried to quit. Knowing why you relapsed is useful information for your next attempt to quit.  Please bring all medications to your clinic visits.  Please arrive 10-15 minutes prior to your scheduled visit time.

## 2024-09-17 ENCOUNTER — Telehealth: Payer: Self-pay | Admitting: Pharmacist

## 2024-09-17 LAB — LDL CHOLESTEROL, DIRECT: LDL Direct: 130 mg/dL — ABNORMAL HIGH (ref 0–99)

## 2024-09-17 MED ORDER — EZETIMIBE 10 MG PO TABS: 10.0000 mg | ORAL_TABLET | Freq: Every day | ORAL | 2 refills | Status: AC

## 2024-09-17 NOTE — Telephone Encounter (Signed)
 Patient contacted for follow-up of Direct LDL lab results  Shared with patient, LDL remains higher than goal and additional therapy is my recommendation.  Current Medications include: rosuvastatin  40mg  daily Patient denies any significant medication related side effects currently.  Patient educated on purpose, proper use and potential adverse effects of ezetimibe  10mg  daily.   Following instruction patient verbalized understanding of treatment plan.     Medication Plan: - Start Ezetimibe  10mg  daily    Total time with patient call and documentation of interaction: 11 minutes.

## 2024-09-18 ENCOUNTER — Ambulatory Visit: Payer: Self-pay

## 2024-09-18 NOTE — Telephone Encounter (Signed)
 Reviewed and agree with Dr Rennis plan.

## 2024-10-28 ENCOUNTER — Encounter: Payer: Self-pay | Admitting: Pharmacist

## 2024-10-28 ENCOUNTER — Ambulatory Visit: Admitting: Pharmacist

## 2024-10-28 VITALS — BP 109/63 | Wt 237.0 lb

## 2024-10-28 DIAGNOSIS — I10 Essential (primary) hypertension: Secondary | ICD-10-CM

## 2024-10-28 DIAGNOSIS — R7303 Prediabetes: Secondary | ICD-10-CM

## 2024-10-28 DIAGNOSIS — Z720 Tobacco use: Secondary | ICD-10-CM | POA: Diagnosis not present

## 2024-10-28 MED ORDER — VARENICLINE TARTRATE 1 MG PO TABS: 1.0000 mg | ORAL_TABLET | Freq: Two times a day (BID) | ORAL | 1 refills | Status: AC

## 2024-10-28 MED ORDER — HYDROCHLOROTHIAZIDE 12.5 MG PO CAPS: 12.5000 mg | ORAL_CAPSULE | Freq: Every day | ORAL | 1 refills | Status: AC

## 2024-10-28 MED ORDER — SEMAGLUTIDE(0.25 OR 0.5MG/DOS) 2 MG/3ML ~~LOC~~ SOPN: 0.5000 mg | PEN_INJECTOR | SUBCUTANEOUS | Status: AC

## 2024-10-28 NOTE — Progress Notes (Signed)
 "  S:   Chief Complaint  Patient presents with   Medication Management    Tobacco Cessation - Weight Management - hypertension    64 y.o. female who presents for evaluation/assistance with tobacco dependence. Patient is in good spirits.   Patient was referred and last seen by Primary Care Provider, Dr. Suzen, on 08/29/2024.  At last visit with me, patient was making great progress toward complete abstinence.    PMH is significant for pre-diabetes, hypertension.   ANumber of cigarettes/day  half pack per day previously and now down intermittent. Has smoked only 2 cigarettes since last visit with use of varenicline  and nicotine  gum.   Rates IMPORTANCE of quitting tobacco as high. Rates CONFIDENCE of quitting tobacco as high.   Most common triggers to use tobacco include; STRESS    O: Clinical ASCVD: No The 10-year ASCVD risk score (Arnett DK, et al., 2019) is: 13.3%   Values used to calculate the score:     Age: 83 years     Clinically relevant sex: Female     Is Non-Hispanic African American: Yes     Diabetic: No     Tobacco smoker: Yes     Systolic Blood Pressure: 109 mmHg     Is BP treated: Yes     HDL Cholesterol: 40 mg/dL     Total Cholesterol: 244 mg/dL  Review of Systems  Neurological:  Positive for dizziness.  All other systems reviewed and are negative.   Physical Exam Vitals reviewed.  Constitutional:      Appearance: Normal appearance.  Pulmonary:     Effort: Pulmonary effort is normal.  Neurological:     Mental Status: She is alert.  Psychiatric:        Mood and Affect: Mood normal.        Behavior: Behavior normal.        Thought Content: Thought content normal.        Judgment: Judgment normal.     A/P: Tobacco use disorder with nicotine  dependence in a patient who is excellent candidate for success because of high motivation to quit and is surrounded with people who motivate her who are also former smokers.  Patient reports that she has smoked  only 2 cigarettes since her last pharmacy visit (none in 2026) Congratulated patient on this progress. Patient has high confidence that she can remain quit long-term.  - Continued nicotine  replacement tx with nicotine  gum. Patient counseled on purpose, proper use, and potential adverse effects.   - Increased varenicline  from 0.5 mg to 1mg  by mouth twice daily with food. Patient counseled on purpose, proper use, and potential adverse effects, including GI upset.   History of prediabetes and is motivated to lose weight. Has recently been on Ozempic  (semaglutide ) ~0.5 mg by using 40 clicks of 1 mg pen that was given as a sample. Expresses that she is working consistently on weight loss and is eating healthier meals and exercising more.  - Continued Ozempic  (semaglutide ) at 0.5mg  weekly.  Medication sample provided. - Counseled patient on dietary changes.  History of hypertension - with good control both in office and at home with readings < 120/80 systolic and diastolic readings in the 60-70 range.  Dizziness is concerning for increasing fall risk.  - Reduced dose of hydrochlorothiazide  from 25 to 12.5mg  daily.     Written patient instructions provided. Patient verbalized understanding of treatment plan.  Total time in face to face counseling 31 minutes.    Follow-up:  Pharmacist 11/25/2024 PCP clinic visit TBD Patient seen with Megan McGill, PharmD Candidate - PY4 student.    "

## 2024-10-28 NOTE — Assessment & Plan Note (Signed)
 History of prediabetes and is motivated to lose weight. Has recently been on Ozempic  (semaglutide ) ~0.5 mg by using 40 clicks of 1 mg pen that was given as a sample. Expresses that she is working consistently on weight loss and is eating healthier meals and exercising more.  - Continued Ozempic  (semaglutide ) at 0.5mg  weekly.  Medication sample provided. - Counseled patient on dietary changes.

## 2024-10-28 NOTE — Assessment & Plan Note (Signed)
 Tobacco use disorder with nicotine  dependence in a patient who is excellent candidate for success because of high motivation to quit and is surrounded with people who motivate her who are also former smokers.  Patient reports that she has smoked only 2 cigarettes since her last pharmacy visit (none in 2026) Congratulated patient on this progress. Patient has high confidence that she can remain quit long-term.  - Continued nicotine  replacement tx with nicotine  gum. Patient counseled on purpose, proper use, and potential adverse effects.   - Increased varenicline  from 0.5 mg to 1mg  by mouth twice daily with food. Patient counseled on purpose, proper use, and potential adverse effects, including GI upset.

## 2024-10-28 NOTE — Patient Instructions (Addendum)
 Nice to see you today!  You are doing GREAT with your progress on quiting!   Medication Changes:  - Increase Varenicline  to 1mg  twice daily with food.  - Continue to use the nicotine  multiple times per day for cravings.   - Reduce hydrochlorothiazide  from 25 to 12.5mg  daily  - Continue all other medication the same.   Tobacco Patient Instructions  Quitting smoking is one of the most important decisions you can make for your current and future health. Consider what you dislike about smoking and how quitting could personally benefit you. Try to cut down.    Starting today, Be a Quitter!  Remind yourself why you want to quit.  Delay your first cigarette of the day for as long as possible.  Start cleaning out all pockets, drawers, and your car of cigarettes.  Getting Through the Cravings Once You Are Smoke Free: Each craving will last about 10 minutes, whether or not you smoke. Here's how to get through the cravings without cigarettes:  DELAY: Tell yourself that you'll wait for the next craving. Do it every time! DEEP BREATHS: One reason smoking feels good is because you breathe in deeply to inhale. Take four slow, deep breaths and feel the relaxation without the hamful effects of cigarettes. DRINK WATER: Drink a glass of cool water. It will give your hands and mouth something to do and will help flush the nicotine  out of your system faster. DIVERT: Do something else -- brush your teeth, take a walk, call a friend who can offer you support. Just moving onto something other than thinking about cigarettes will move you through the craving.   Frequently Asked Questions  What can I do when I get the urge to smoke? To get through the urge to smoke, try the following:  Review your reasons for quitting and think of all the benefits to your health, your finances, and your family.  Remind yourself that there is no such thing as just one cigarette -- or even one puff.  Ride out the desire to  smoke. Use the 4 Os -- Delay, Deep Breaths, Drink Water and Divert to get you through. The craving will go away eventually. Do not fool yourself into thinking you can have just one cigarette.  Any tips on how to deal with stress? Stress is a natural part of life. The key is to deal with it without reaching for a cigarette. Taking deep breaths, counting backwards from 10 and asking yourself 1-how big a deal is this?  Writing down your feelings, talking with a friend and doing things like positive self-talk and meditation are some other ways that people deal with daily stress.  What if I start smoking again? Slips happen. Most people try to quit smoking a few times before they are successful. Don't beat yourself up if this happens to you! Ask yourself if this was a slip or a relapse. A slip is a one-time mistake that is quickly corrected. A relapse is going back to your old smoking habits.   If you slip, don't give up. Think of it as a learning experience. Ask yourself what went wrong and renew your commitment to staying away from smoking for good.  If you relapse, try not to get discouraged. Ask yourself the question What caused me to start smoking? Figure out what helped you and what didn't when you tried to quit. Knowing why you relapsed is useful information for your next attempt to quit.  Please bring all  medications to your clinic visits.  Please arrive 10-15 minutes prior to your scheduled visit time.

## 2024-10-28 NOTE — Assessment & Plan Note (Signed)
 History of hypertension - with good control both in office and at home with readings < 120/80 systolic and diastolic readings in the 60-70 range.  Dizziness is concerning for increasing fall risk.  - Reduced dose of hydrochlorothiazide  from 25 to 12.5mg  daily.

## 2024-10-28 NOTE — Progress Notes (Signed)
 Reviewed and agree with Dr Rennis plan.

## 2024-11-22 ENCOUNTER — Telehealth: Payer: Self-pay | Admitting: Pharmacist

## 2024-11-22 NOTE — Telephone Encounter (Signed)
 Attempted to contact patient for rescheduling of upcoming appointment due to possibility of bad weather.   Patient agreed to reschedule - now 12/09/2024 at 8:30  Patient reports she has only smoked two cigarettes since last contact.  She reports she is doing very well except with high stress such as her mother recently being hospitalized.   Medication Samples have been provided for patient pick-up later today  Drug name: Ozempic  (semaglutide )   Qty: 1 pen  LOT: [id number]  Exp.Date: 01/22/2027  Dosing instructions: Inject weekly  The patient has been instructed regarding the correct time, dose, and frequency of taking this medication, including desired effects and most common side effects.   [name] 1:19 PM 11/22/2024   Total time with patient call and documentation of interaction: 12 minutes.

## 2024-11-25 ENCOUNTER — Ambulatory Visit: Admitting: Pharmacist

## 2024-11-26 NOTE — Telephone Encounter (Signed)
 Reviewed and agree with Dr Rennis plan.

## 2024-12-09 ENCOUNTER — Ambulatory Visit: Admitting: Pharmacist

## 2025-01-09 ENCOUNTER — Ambulatory Visit: Admitting: Dermatology

## 2025-01-27 ENCOUNTER — Ambulatory Visit: Admitting: Dermatology
